# Patient Record
Sex: Male | Born: 1975
Health system: Southern US, Community
[De-identification: ages and names within clinical notes are randomized; demographics above are authoritative.]

## PROBLEM LIST (undated history)

## (undated) DIAGNOSIS — F4010 Social phobia, unspecified: Secondary | ICD-10-CM

## (undated) DIAGNOSIS — Z8719 Personal history of other diseases of the digestive system: Secondary | ICD-10-CM

## (undated) DIAGNOSIS — J189 Pneumonia, unspecified organism: Secondary | ICD-10-CM

## (undated) DIAGNOSIS — G629 Polyneuropathy, unspecified: Secondary | ICD-10-CM

## (undated) DIAGNOSIS — I10 Essential (primary) hypertension: Secondary | ICD-10-CM

## (undated) DIAGNOSIS — B450 Pulmonary cryptococcosis: Secondary | ICD-10-CM

## (undated) DIAGNOSIS — Z8711 Personal history of peptic ulcer disease: Secondary | ICD-10-CM

## (undated) DIAGNOSIS — R112 Nausea with vomiting, unspecified: Secondary | ICD-10-CM

## (undated) DIAGNOSIS — C8290 Follicular lymphoma, unspecified, unspecified site: Secondary | ICD-10-CM

## (undated) DIAGNOSIS — M545 Low back pain, unspecified: Secondary | ICD-10-CM

## (undated) DIAGNOSIS — Z9889 Other specified postprocedural states: Secondary | ICD-10-CM

## (undated) DIAGNOSIS — Z8739 Personal history of other diseases of the musculoskeletal system and connective tissue: Secondary | ICD-10-CM

## (undated) DIAGNOSIS — K219 Gastro-esophageal reflux disease without esophagitis: Secondary | ICD-10-CM

## (undated) DIAGNOSIS — G8929 Other chronic pain: Secondary | ICD-10-CM

## (undated) HISTORY — PX: TONSILLECTOMY: SUR1361

## (undated) HISTORY — PX: KNEE ARTHROSCOPY: SHX127

## (undated) HISTORY — PX: CARPAL TUNNEL RELEASE: SHX101

---

## 1999-07-08 ENCOUNTER — Ambulatory Visit (HOSPITAL_BASED_OUTPATIENT_CLINIC_OR_DEPARTMENT_OTHER): Admission: RE | Admit: 1999-07-08 | Discharge: 1999-07-08 | Payer: Self-pay | Admitting: Orthopaedic Surgery

## 2003-01-28 ENCOUNTER — Encounter: Payer: Self-pay | Admitting: Specialist

## 2003-01-28 ENCOUNTER — Ambulatory Visit (HOSPITAL_COMMUNITY): Admission: RE | Admit: 2003-01-28 | Discharge: 2003-01-28 | Payer: Self-pay | Admitting: Specialist

## 2009-07-05 DIAGNOSIS — C8290 Follicular lymphoma, unspecified, unspecified site: Secondary | ICD-10-CM

## 2009-07-05 HISTORY — PX: AXILLARY LYMPH NODE BIOPSY: SHX5737

## 2009-07-05 HISTORY — PX: PORTA CATH INSERTION: CATH118285

## 2009-07-05 HISTORY — DX: Follicular lymphoma, unspecified, unspecified site: C82.90

## 2009-12-23 ENCOUNTER — Other Ambulatory Visit: Admission: RE | Admit: 2009-12-23 | Discharge: 2009-12-23 | Payer: Self-pay | Admitting: *Deleted

## 2012-06-23 DIAGNOSIS — J209 Acute bronchitis, unspecified: Secondary | ICD-10-CM | POA: Diagnosis not present

## 2012-07-03 DIAGNOSIS — C8298 Follicular lymphoma, unspecified, lymph nodes of multiple sites: Secondary | ICD-10-CM | POA: Diagnosis not present

## 2012-07-03 DIAGNOSIS — D702 Other drug-induced agranulocytosis: Secondary | ICD-10-CM | POA: Diagnosis not present

## 2012-08-31 DIAGNOSIS — R599 Enlarged lymph nodes, unspecified: Secondary | ICD-10-CM | POA: Diagnosis not present

## 2012-08-31 DIAGNOSIS — K7689 Other specified diseases of liver: Secondary | ICD-10-CM | POA: Diagnosis not present

## 2012-08-31 DIAGNOSIS — C8298 Follicular lymphoma, unspecified, lymph nodes of multiple sites: Secondary | ICD-10-CM | POA: Diagnosis not present

## 2012-09-07 DIAGNOSIS — C8298 Follicular lymphoma, unspecified, lymph nodes of multiple sites: Secondary | ICD-10-CM | POA: Diagnosis not present

## 2012-09-20 DIAGNOSIS — R599 Enlarged lymph nodes, unspecified: Secondary | ICD-10-CM | POA: Diagnosis not present

## 2012-09-20 DIAGNOSIS — Z87898 Personal history of other specified conditions: Secondary | ICD-10-CM | POA: Diagnosis not present

## 2012-09-20 DIAGNOSIS — K7689 Other specified diseases of liver: Secondary | ICD-10-CM | POA: Diagnosis not present

## 2012-10-03 DIAGNOSIS — Z79899 Other long term (current) drug therapy: Secondary | ICD-10-CM | POA: Diagnosis not present

## 2012-10-03 DIAGNOSIS — C8299 Follicular lymphoma, unspecified, extranodal and solid organ sites: Secondary | ICD-10-CM | POA: Diagnosis not present

## 2012-10-06 DIAGNOSIS — C8589 Other specified types of non-Hodgkin lymphoma, extranodal and solid organ sites: Secondary | ICD-10-CM | POA: Diagnosis not present

## 2012-10-06 DIAGNOSIS — J301 Allergic rhinitis due to pollen: Secondary | ICD-10-CM | POA: Diagnosis not present

## 2012-10-06 DIAGNOSIS — I10 Essential (primary) hypertension: Secondary | ICD-10-CM | POA: Diagnosis not present

## 2012-10-09 DIAGNOSIS — C8298 Follicular lymphoma, unspecified, lymph nodes of multiple sites: Secondary | ICD-10-CM | POA: Diagnosis not present

## 2012-10-09 DIAGNOSIS — D702 Other drug-induced agranulocytosis: Secondary | ICD-10-CM | POA: Diagnosis not present

## 2012-10-26 DIAGNOSIS — I4891 Unspecified atrial fibrillation: Secondary | ICD-10-CM | POA: Diagnosis not present

## 2012-10-26 DIAGNOSIS — C8299 Follicular lymphoma, unspecified, extranodal and solid organ sites: Secondary | ICD-10-CM | POA: Diagnosis not present

## 2012-10-26 DIAGNOSIS — Z79899 Other long term (current) drug therapy: Secondary | ICD-10-CM | POA: Diagnosis not present

## 2012-10-26 DIAGNOSIS — C8589 Other specified types of non-Hodgkin lymphoma, extranodal and solid organ sites: Secondary | ICD-10-CM | POA: Diagnosis not present

## 2012-10-26 DIAGNOSIS — C8290 Follicular lymphoma, unspecified, unspecified site: Secondary | ICD-10-CM | POA: Insufficient documentation

## 2012-11-10 DIAGNOSIS — C8299 Follicular lymphoma, unspecified, extranodal and solid organ sites: Secondary | ICD-10-CM | POA: Diagnosis not present

## 2012-11-10 DIAGNOSIS — C8589 Other specified types of non-Hodgkin lymphoma, extranodal and solid organ sites: Secondary | ICD-10-CM | POA: Diagnosis not present

## 2012-11-10 DIAGNOSIS — Z79899 Other long term (current) drug therapy: Secondary | ICD-10-CM | POA: Diagnosis not present

## 2012-11-16 DIAGNOSIS — C96A Histiocytic sarcoma: Secondary | ICD-10-CM | POA: Diagnosis not present

## 2012-11-16 DIAGNOSIS — C8299 Follicular lymphoma, unspecified, extranodal and solid organ sites: Secondary | ICD-10-CM | POA: Diagnosis not present

## 2012-11-23 DIAGNOSIS — D702 Other drug-induced agranulocytosis: Secondary | ICD-10-CM | POA: Diagnosis not present

## 2012-11-23 DIAGNOSIS — C8298 Follicular lymphoma, unspecified, lymph nodes of multiple sites: Secondary | ICD-10-CM | POA: Diagnosis not present

## 2012-11-24 DIAGNOSIS — Z5111 Encounter for antineoplastic chemotherapy: Secondary | ICD-10-CM | POA: Diagnosis not present

## 2012-11-24 DIAGNOSIS — C8298 Follicular lymphoma, unspecified, lymph nodes of multiple sites: Secondary | ICD-10-CM | POA: Diagnosis not present

## 2012-11-24 DIAGNOSIS — D702 Other drug-induced agranulocytosis: Secondary | ICD-10-CM | POA: Diagnosis not present

## 2012-11-27 DIAGNOSIS — C8298 Follicular lymphoma, unspecified, lymph nodes of multiple sites: Secondary | ICD-10-CM | POA: Diagnosis not present

## 2012-11-27 DIAGNOSIS — D702 Other drug-induced agranulocytosis: Secondary | ICD-10-CM | POA: Diagnosis not present

## 2012-11-27 DIAGNOSIS — IMO0002 Reserved for concepts with insufficient information to code with codable children: Secondary | ICD-10-CM | POA: Diagnosis not present

## 2012-12-01 DIAGNOSIS — R7401 Elevation of levels of liver transaminase levels: Secondary | ICD-10-CM | POA: Diagnosis present

## 2012-12-01 DIAGNOSIS — D61810 Antineoplastic chemotherapy induced pancytopenia: Secondary | ICD-10-CM | POA: Diagnosis present

## 2012-12-01 DIAGNOSIS — I10 Essential (primary) hypertension: Secondary | ICD-10-CM | POA: Diagnosis not present

## 2012-12-01 DIAGNOSIS — R7402 Elevation of levels of lactic acid dehydrogenase (LDH): Secondary | ICD-10-CM | POA: Diagnosis not present

## 2012-12-01 DIAGNOSIS — T451X5A Adverse effect of antineoplastic and immunosuppressive drugs, initial encounter: Secondary | ICD-10-CM | POA: Diagnosis not present

## 2012-12-01 DIAGNOSIS — C8299 Follicular lymphoma, unspecified, extranodal and solid organ sites: Secondary | ICD-10-CM | POA: Diagnosis not present

## 2012-12-01 DIAGNOSIS — R509 Fever, unspecified: Secondary | ICD-10-CM | POA: Diagnosis not present

## 2012-12-01 DIAGNOSIS — R7989 Other specified abnormal findings of blood chemistry: Secondary | ICD-10-CM | POA: Diagnosis present

## 2012-12-01 DIAGNOSIS — A4150 Gram-negative sepsis, unspecified: Secondary | ICD-10-CM | POA: Diagnosis not present

## 2012-12-01 DIAGNOSIS — M1990 Unspecified osteoarthritis, unspecified site: Secondary | ICD-10-CM | POA: Diagnosis not present

## 2012-12-01 DIAGNOSIS — D696 Thrombocytopenia, unspecified: Secondary | ICD-10-CM | POA: Diagnosis not present

## 2012-12-01 DIAGNOSIS — F411 Generalized anxiety disorder: Secondary | ICD-10-CM | POA: Diagnosis not present

## 2012-12-01 DIAGNOSIS — Z79899 Other long term (current) drug therapy: Secondary | ICD-10-CM | POA: Diagnosis not present

## 2012-12-01 DIAGNOSIS — D709 Neutropenia, unspecified: Secondary | ICD-10-CM | POA: Diagnosis not present

## 2012-12-01 DIAGNOSIS — A419 Sepsis, unspecified organism: Secondary | ICD-10-CM | POA: Diagnosis not present

## 2012-12-02 DIAGNOSIS — D61810 Antineoplastic chemotherapy induced pancytopenia: Secondary | ICD-10-CM | POA: Diagnosis not present

## 2012-12-02 DIAGNOSIS — A4150 Gram-negative sepsis, unspecified: Secondary | ICD-10-CM | POA: Diagnosis not present

## 2012-12-02 DIAGNOSIS — M1990 Unspecified osteoarthritis, unspecified site: Secondary | ICD-10-CM | POA: Diagnosis not present

## 2012-12-02 DIAGNOSIS — C8299 Follicular lymphoma, unspecified, extranodal and solid organ sites: Secondary | ICD-10-CM | POA: Diagnosis not present

## 2012-12-02 DIAGNOSIS — F411 Generalized anxiety disorder: Secondary | ICD-10-CM | POA: Diagnosis not present

## 2012-12-02 DIAGNOSIS — I10 Essential (primary) hypertension: Secondary | ICD-10-CM | POA: Diagnosis not present

## 2012-12-02 DIAGNOSIS — D709 Neutropenia, unspecified: Secondary | ICD-10-CM | POA: Diagnosis not present

## 2012-12-02 DIAGNOSIS — A419 Sepsis, unspecified organism: Secondary | ICD-10-CM | POA: Diagnosis not present

## 2012-12-03 DIAGNOSIS — F411 Generalized anxiety disorder: Secondary | ICD-10-CM | POA: Diagnosis not present

## 2012-12-03 DIAGNOSIS — A4150 Gram-negative sepsis, unspecified: Secondary | ICD-10-CM | POA: Diagnosis not present

## 2012-12-03 DIAGNOSIS — D709 Neutropenia, unspecified: Secondary | ICD-10-CM | POA: Diagnosis not present

## 2012-12-03 DIAGNOSIS — A419 Sepsis, unspecified organism: Secondary | ICD-10-CM | POA: Diagnosis not present

## 2012-12-03 DIAGNOSIS — D61810 Antineoplastic chemotherapy induced pancytopenia: Secondary | ICD-10-CM | POA: Diagnosis not present

## 2012-12-03 DIAGNOSIS — C8299 Follicular lymphoma, unspecified, extranodal and solid organ sites: Secondary | ICD-10-CM | POA: Diagnosis not present

## 2012-12-03 DIAGNOSIS — I10 Essential (primary) hypertension: Secondary | ICD-10-CM | POA: Diagnosis not present

## 2012-12-03 DIAGNOSIS — M1990 Unspecified osteoarthritis, unspecified site: Secondary | ICD-10-CM | POA: Diagnosis not present

## 2012-12-04 DIAGNOSIS — C8299 Follicular lymphoma, unspecified, extranodal and solid organ sites: Secondary | ICD-10-CM | POA: Diagnosis not present

## 2012-12-04 DIAGNOSIS — I10 Essential (primary) hypertension: Secondary | ICD-10-CM | POA: Diagnosis not present

## 2012-12-04 DIAGNOSIS — D709 Neutropenia, unspecified: Secondary | ICD-10-CM | POA: Diagnosis not present

## 2012-12-04 DIAGNOSIS — A419 Sepsis, unspecified organism: Secondary | ICD-10-CM | POA: Diagnosis not present

## 2012-12-15 DIAGNOSIS — D702 Other drug-induced agranulocytosis: Secondary | ICD-10-CM | POA: Diagnosis not present

## 2012-12-15 DIAGNOSIS — C8298 Follicular lymphoma, unspecified, lymph nodes of multiple sites: Secondary | ICD-10-CM | POA: Diagnosis not present

## 2012-12-18 DIAGNOSIS — D702 Other drug-induced agranulocytosis: Secondary | ICD-10-CM | POA: Diagnosis not present

## 2012-12-18 DIAGNOSIS — C8298 Follicular lymphoma, unspecified, lymph nodes of multiple sites: Secondary | ICD-10-CM | POA: Diagnosis not present

## 2013-01-02 DIAGNOSIS — C8298 Follicular lymphoma, unspecified, lymph nodes of multiple sites: Secondary | ICD-10-CM | POA: Diagnosis not present

## 2013-01-04 DIAGNOSIS — D702 Other drug-induced agranulocytosis: Secondary | ICD-10-CM | POA: Diagnosis not present

## 2013-01-04 DIAGNOSIS — C8298 Follicular lymphoma, unspecified, lymph nodes of multiple sites: Secondary | ICD-10-CM | POA: Diagnosis not present

## 2013-01-05 DIAGNOSIS — Z5189 Encounter for other specified aftercare: Secondary | ICD-10-CM | POA: Diagnosis not present

## 2013-01-05 DIAGNOSIS — C8589 Other specified types of non-Hodgkin lymphoma, extranodal and solid organ sites: Secondary | ICD-10-CM | POA: Diagnosis not present

## 2013-01-26 DIAGNOSIS — IMO0002 Reserved for concepts with insufficient information to code with codable children: Secondary | ICD-10-CM | POA: Diagnosis not present

## 2013-01-26 DIAGNOSIS — C8298 Follicular lymphoma, unspecified, lymph nodes of multiple sites: Secondary | ICD-10-CM | POA: Diagnosis not present

## 2013-01-29 DIAGNOSIS — C8589 Other specified types of non-Hodgkin lymphoma, extranodal and solid organ sites: Secondary | ICD-10-CM | POA: Diagnosis not present

## 2013-01-29 DIAGNOSIS — C8298 Follicular lymphoma, unspecified, lymph nodes of multiple sites: Secondary | ICD-10-CM | POA: Diagnosis not present

## 2013-01-29 DIAGNOSIS — D702 Other drug-induced agranulocytosis: Secondary | ICD-10-CM | POA: Diagnosis not present

## 2013-02-19 DIAGNOSIS — C8298 Follicular lymphoma, unspecified, lymph nodes of multiple sites: Secondary | ICD-10-CM | POA: Diagnosis not present

## 2013-02-19 DIAGNOSIS — D702 Other drug-induced agranulocytosis: Secondary | ICD-10-CM | POA: Diagnosis not present

## 2013-02-22 DIAGNOSIS — C8589 Other specified types of non-Hodgkin lymphoma, extranodal and solid organ sites: Secondary | ICD-10-CM | POA: Diagnosis not present

## 2013-02-22 DIAGNOSIS — R0602 Shortness of breath: Secondary | ICD-10-CM | POA: Diagnosis not present

## 2013-02-22 DIAGNOSIS — Z01818 Encounter for other preprocedural examination: Secondary | ICD-10-CM | POA: Diagnosis not present

## 2013-02-22 DIAGNOSIS — I4891 Unspecified atrial fibrillation: Secondary | ICD-10-CM | POA: Diagnosis not present

## 2013-02-22 DIAGNOSIS — Z79899 Other long term (current) drug therapy: Secondary | ICD-10-CM | POA: Diagnosis not present

## 2013-02-27 DIAGNOSIS — C8298 Follicular lymphoma, unspecified, lymph nodes of multiple sites: Secondary | ICD-10-CM | POA: Diagnosis not present

## 2013-02-28 DIAGNOSIS — C8589 Other specified types of non-Hodgkin lymphoma, extranodal and solid organ sites: Secondary | ICD-10-CM | POA: Diagnosis not present

## 2013-02-28 DIAGNOSIS — M799 Soft tissue disorder, unspecified: Secondary | ICD-10-CM | POA: Diagnosis not present

## 2013-02-28 DIAGNOSIS — C8299 Follicular lymphoma, unspecified, extranodal and solid organ sites: Secondary | ICD-10-CM | POA: Diagnosis not present

## 2013-03-01 DIAGNOSIS — J209 Acute bronchitis, unspecified: Secondary | ICD-10-CM | POA: Diagnosis not present

## 2013-03-07 DIAGNOSIS — C8299 Follicular lymphoma, unspecified, extranodal and solid organ sites: Secondary | ICD-10-CM | POA: Diagnosis not present

## 2013-03-07 DIAGNOSIS — Z9481 Bone marrow transplant status: Secondary | ICD-10-CM | POA: Diagnosis not present

## 2013-03-15 DIAGNOSIS — C8299 Follicular lymphoma, unspecified, extranodal and solid organ sites: Secondary | ICD-10-CM | POA: Diagnosis not present

## 2013-03-19 DIAGNOSIS — D702 Other drug-induced agranulocytosis: Secondary | ICD-10-CM | POA: Diagnosis not present

## 2013-03-19 DIAGNOSIS — Z5111 Encounter for antineoplastic chemotherapy: Secondary | ICD-10-CM | POA: Diagnosis not present

## 2013-03-19 DIAGNOSIS — C8298 Follicular lymphoma, unspecified, lymph nodes of multiple sites: Secondary | ICD-10-CM | POA: Diagnosis not present

## 2013-03-20 DIAGNOSIS — D702 Other drug-induced agranulocytosis: Secondary | ICD-10-CM | POA: Diagnosis not present

## 2013-03-20 DIAGNOSIS — C8298 Follicular lymphoma, unspecified, lymph nodes of multiple sites: Secondary | ICD-10-CM | POA: Diagnosis not present

## 2013-04-02 DIAGNOSIS — D702 Other drug-induced agranulocytosis: Secondary | ICD-10-CM | POA: Diagnosis not present

## 2013-04-02 DIAGNOSIS — C8298 Follicular lymphoma, unspecified, lymph nodes of multiple sites: Secondary | ICD-10-CM | POA: Diagnosis not present

## 2013-04-09 DIAGNOSIS — D702 Other drug-induced agranulocytosis: Secondary | ICD-10-CM | POA: Diagnosis not present

## 2013-04-09 DIAGNOSIS — C8298 Follicular lymphoma, unspecified, lymph nodes of multiple sites: Secondary | ICD-10-CM | POA: Diagnosis not present

## 2013-04-09 DIAGNOSIS — Z5111 Encounter for antineoplastic chemotherapy: Secondary | ICD-10-CM | POA: Diagnosis not present

## 2013-04-10 DIAGNOSIS — C8298 Follicular lymphoma, unspecified, lymph nodes of multiple sites: Secondary | ICD-10-CM | POA: Diagnosis not present

## 2013-04-10 DIAGNOSIS — D702 Other drug-induced agranulocytosis: Secondary | ICD-10-CM | POA: Diagnosis not present

## 2013-05-22 DIAGNOSIS — C8298 Follicular lymphoma, unspecified, lymph nodes of multiple sites: Secondary | ICD-10-CM | POA: Diagnosis not present

## 2013-05-22 DIAGNOSIS — C8589 Other specified types of non-Hodgkin lymphoma, extranodal and solid organ sites: Secondary | ICD-10-CM | POA: Diagnosis not present

## 2013-05-22 DIAGNOSIS — D702 Other drug-induced agranulocytosis: Secondary | ICD-10-CM | POA: Diagnosis not present

## 2013-05-24 DIAGNOSIS — Z23 Encounter for immunization: Secondary | ICD-10-CM | POA: Diagnosis not present

## 2013-05-24 DIAGNOSIS — C8298 Follicular lymphoma, unspecified, lymph nodes of multiple sites: Secondary | ICD-10-CM | POA: Diagnosis not present

## 2013-05-24 DIAGNOSIS — Z09 Encounter for follow-up examination after completed treatment for conditions other than malignant neoplasm: Secondary | ICD-10-CM | POA: Diagnosis not present

## 2013-07-31 DIAGNOSIS — C8298 Follicular lymphoma, unspecified, lymph nodes of multiple sites: Secondary | ICD-10-CM | POA: Diagnosis not present

## 2013-07-31 DIAGNOSIS — C8589 Other specified types of non-Hodgkin lymphoma, extranodal and solid organ sites: Secondary | ICD-10-CM | POA: Diagnosis not present

## 2013-08-02 DIAGNOSIS — C8293 Follicular lymphoma, unspecified, intra-abdominal lymph nodes: Secondary | ICD-10-CM | POA: Diagnosis not present

## 2013-08-02 DIAGNOSIS — Z09 Encounter for follow-up examination after completed treatment for conditions other than malignant neoplasm: Secondary | ICD-10-CM | POA: Diagnosis not present

## 2013-09-06 DIAGNOSIS — C8299 Follicular lymphoma, unspecified, extranodal and solid organ sites: Secondary | ICD-10-CM | POA: Diagnosis not present

## 2013-09-11 DIAGNOSIS — D702 Other drug-induced agranulocytosis: Secondary | ICD-10-CM | POA: Diagnosis not present

## 2013-09-11 DIAGNOSIS — Z09 Encounter for follow-up examination after completed treatment for conditions other than malignant neoplasm: Secondary | ICD-10-CM | POA: Diagnosis not present

## 2013-09-11 DIAGNOSIS — C8298 Follicular lymphoma, unspecified, lymph nodes of multiple sites: Secondary | ICD-10-CM | POA: Diagnosis not present

## 2013-12-14 DIAGNOSIS — C8298 Follicular lymphoma, unspecified, lymph nodes of multiple sites: Secondary | ICD-10-CM | POA: Diagnosis not present

## 2013-12-14 DIAGNOSIS — Z09 Encounter for follow-up examination after completed treatment for conditions other than malignant neoplasm: Secondary | ICD-10-CM | POA: Diagnosis not present

## 2013-12-21 DIAGNOSIS — R5381 Other malaise: Secondary | ICD-10-CM | POA: Diagnosis not present

## 2013-12-21 DIAGNOSIS — R110 Nausea: Secondary | ICD-10-CM | POA: Diagnosis not present

## 2013-12-21 DIAGNOSIS — R5383 Other fatigue: Secondary | ICD-10-CM | POA: Diagnosis not present

## 2013-12-21 DIAGNOSIS — E860 Dehydration: Secondary | ICD-10-CM | POA: Diagnosis not present

## 2013-12-21 DIAGNOSIS — J9600 Acute respiratory failure, unspecified whether with hypoxia or hypercapnia: Secondary | ICD-10-CM | POA: Diagnosis not present

## 2013-12-21 DIAGNOSIS — R5081 Fever presenting with conditions classified elsewhere: Secondary | ICD-10-CM | POA: Diagnosis not present

## 2013-12-21 DIAGNOSIS — J3489 Other specified disorders of nose and nasal sinuses: Secondary | ICD-10-CM | POA: Diagnosis not present

## 2013-12-21 DIAGNOSIS — C8589 Other specified types of non-Hodgkin lymphoma, extranodal and solid organ sites: Secondary | ICD-10-CM | POA: Diagnosis not present

## 2013-12-21 DIAGNOSIS — R109 Unspecified abdominal pain: Secondary | ICD-10-CM | POA: Diagnosis not present

## 2013-12-21 DIAGNOSIS — E876 Hypokalemia: Secondary | ICD-10-CM | POA: Diagnosis not present

## 2013-12-21 DIAGNOSIS — R05 Cough: Secondary | ICD-10-CM | POA: Diagnosis not present

## 2013-12-21 DIAGNOSIS — I10 Essential (primary) hypertension: Secondary | ICD-10-CM | POA: Diagnosis not present

## 2013-12-21 DIAGNOSIS — Z79899 Other long term (current) drug therapy: Secondary | ICD-10-CM | POA: Diagnosis not present

## 2013-12-21 DIAGNOSIS — K5900 Constipation, unspecified: Secondary | ICD-10-CM | POA: Diagnosis not present

## 2013-12-21 DIAGNOSIS — D709 Neutropenia, unspecified: Secondary | ICD-10-CM | POA: Diagnosis not present

## 2013-12-21 DIAGNOSIS — R509 Fever, unspecified: Secondary | ICD-10-CM | POA: Diagnosis not present

## 2013-12-21 DIAGNOSIS — K219 Gastro-esophageal reflux disease without esophagitis: Secondary | ICD-10-CM | POA: Diagnosis not present

## 2013-12-21 DIAGNOSIS — R52 Pain, unspecified: Secondary | ICD-10-CM | POA: Diagnosis not present

## 2013-12-21 DIAGNOSIS — R059 Cough, unspecified: Secondary | ICD-10-CM | POA: Diagnosis not present

## 2013-12-21 DIAGNOSIS — C8299 Follicular lymphoma, unspecified, extranodal and solid organ sites: Secondary | ICD-10-CM | POA: Diagnosis not present

## 2013-12-21 DIAGNOSIS — F411 Generalized anxiety disorder: Secondary | ICD-10-CM | POA: Diagnosis not present

## 2013-12-27 DIAGNOSIS — D702 Other drug-induced agranulocytosis: Secondary | ICD-10-CM | POA: Diagnosis not present

## 2013-12-27 DIAGNOSIS — Z09 Encounter for follow-up examination after completed treatment for conditions other than malignant neoplasm: Secondary | ICD-10-CM | POA: Diagnosis not present

## 2013-12-27 DIAGNOSIS — C8298 Follicular lymphoma, unspecified, lymph nodes of multiple sites: Secondary | ICD-10-CM | POA: Diagnosis not present

## 2013-12-28 DIAGNOSIS — D702 Other drug-induced agranulocytosis: Secondary | ICD-10-CM | POA: Diagnosis not present

## 2013-12-28 DIAGNOSIS — C8298 Follicular lymphoma, unspecified, lymph nodes of multiple sites: Secondary | ICD-10-CM | POA: Diagnosis not present

## 2013-12-31 DIAGNOSIS — C8298 Follicular lymphoma, unspecified, lymph nodes of multiple sites: Secondary | ICD-10-CM | POA: Diagnosis not present

## 2013-12-31 DIAGNOSIS — D702 Other drug-induced agranulocytosis: Secondary | ICD-10-CM | POA: Diagnosis not present

## 2013-12-31 DIAGNOSIS — Z09 Encounter for follow-up examination after completed treatment for conditions other than malignant neoplasm: Secondary | ICD-10-CM | POA: Diagnosis not present

## 2014-01-15 DIAGNOSIS — C8589 Other specified types of non-Hodgkin lymphoma, extranodal and solid organ sites: Secondary | ICD-10-CM | POA: Diagnosis not present

## 2014-01-15 DIAGNOSIS — C8298 Follicular lymphoma, unspecified, lymph nodes of multiple sites: Secondary | ICD-10-CM | POA: Diagnosis not present

## 2014-01-16 DIAGNOSIS — C8298 Follicular lymphoma, unspecified, lymph nodes of multiple sites: Secondary | ICD-10-CM | POA: Diagnosis not present

## 2014-01-16 DIAGNOSIS — D702 Other drug-induced agranulocytosis: Secondary | ICD-10-CM | POA: Diagnosis not present

## 2014-01-16 DIAGNOSIS — Z09 Encounter for follow-up examination after completed treatment for conditions other than malignant neoplasm: Secondary | ICD-10-CM | POA: Diagnosis not present

## 2014-03-04 DIAGNOSIS — C8299 Follicular lymphoma, unspecified, extranodal and solid organ sites: Secondary | ICD-10-CM | POA: Diagnosis not present

## 2014-04-05 DIAGNOSIS — Z5111 Encounter for antineoplastic chemotherapy: Secondary | ICD-10-CM | POA: Diagnosis not present

## 2014-04-05 DIAGNOSIS — C8218 Follicular lymphoma grade II, lymph nodes of multiple sites: Secondary | ICD-10-CM | POA: Diagnosis not present

## 2014-05-03 DIAGNOSIS — L853 Xerosis cutis: Secondary | ICD-10-CM | POA: Diagnosis not present

## 2014-05-03 DIAGNOSIS — C8218 Follicular lymphoma grade II, lymph nodes of multiple sites: Secondary | ICD-10-CM | POA: Diagnosis not present

## 2014-05-03 DIAGNOSIS — Z23 Encounter for immunization: Secondary | ICD-10-CM | POA: Diagnosis not present

## 2014-05-20 DIAGNOSIS — S93401A Sprain of unspecified ligament of right ankle, initial encounter: Secondary | ICD-10-CM | POA: Diagnosis not present

## 2014-05-20 DIAGNOSIS — W1840XA Slipping, tripping and stumbling without falling, unspecified, initial encounter: Secondary | ICD-10-CM | POA: Diagnosis not present

## 2014-05-20 DIAGNOSIS — I10 Essential (primary) hypertension: Secondary | ICD-10-CM | POA: Diagnosis not present

## 2014-05-31 DIAGNOSIS — D709 Neutropenia, unspecified: Secondary | ICD-10-CM | POA: Diagnosis not present

## 2014-05-31 DIAGNOSIS — C8218 Follicular lymphoma grade II, lymph nodes of multiple sites: Secondary | ICD-10-CM | POA: Diagnosis not present

## 2014-06-27 DIAGNOSIS — C8218 Follicular lymphoma grade II, lymph nodes of multiple sites: Secondary | ICD-10-CM | POA: Diagnosis not present

## 2014-07-01 DIAGNOSIS — Z5111 Encounter for antineoplastic chemotherapy: Secondary | ICD-10-CM | POA: Diagnosis not present

## 2014-07-01 DIAGNOSIS — C8218 Follicular lymphoma grade II, lymph nodes of multiple sites: Secondary | ICD-10-CM | POA: Diagnosis not present

## 2014-07-08 DIAGNOSIS — J0100 Acute maxillary sinusitis, unspecified: Secondary | ICD-10-CM | POA: Diagnosis not present

## 2014-07-25 DIAGNOSIS — C8218 Follicular lymphoma grade II, lymph nodes of multiple sites: Secondary | ICD-10-CM | POA: Diagnosis not present

## 2014-07-25 DIAGNOSIS — C8293 Follicular lymphoma, unspecified, intra-abdominal lymph nodes: Secondary | ICD-10-CM | POA: Diagnosis not present

## 2014-07-29 DIAGNOSIS — C8218 Follicular lymphoma grade II, lymph nodes of multiple sites: Secondary | ICD-10-CM | POA: Diagnosis not present

## 2014-07-29 DIAGNOSIS — R5383 Other fatigue: Secondary | ICD-10-CM | POA: Diagnosis not present

## 2014-08-30 DIAGNOSIS — C8218 Follicular lymphoma grade II, lymph nodes of multiple sites: Secondary | ICD-10-CM | POA: Diagnosis not present

## 2014-09-04 DIAGNOSIS — C8218 Follicular lymphoma grade II, lymph nodes of multiple sites: Secondary | ICD-10-CM | POA: Diagnosis not present

## 2014-09-04 DIAGNOSIS — Z5111 Encounter for antineoplastic chemotherapy: Secondary | ICD-10-CM | POA: Diagnosis not present

## 2014-09-05 DIAGNOSIS — C8218 Follicular lymphoma grade II, lymph nodes of multiple sites: Secondary | ICD-10-CM | POA: Diagnosis not present

## 2014-09-17 DIAGNOSIS — C8218 Follicular lymphoma grade II, lymph nodes of multiple sites: Secondary | ICD-10-CM | POA: Diagnosis not present

## 2014-10-04 DIAGNOSIS — C8218 Follicular lymphoma grade II, lymph nodes of multiple sites: Secondary | ICD-10-CM | POA: Diagnosis not present

## 2014-10-07 DIAGNOSIS — C8218 Follicular lymphoma grade II, lymph nodes of multiple sites: Secondary | ICD-10-CM | POA: Diagnosis not present

## 2014-10-11 DIAGNOSIS — C8218 Follicular lymphoma grade II, lymph nodes of multiple sites: Secondary | ICD-10-CM | POA: Diagnosis not present

## 2014-10-11 DIAGNOSIS — Z5111 Encounter for antineoplastic chemotherapy: Secondary | ICD-10-CM | POA: Diagnosis not present

## 2014-11-05 DIAGNOSIS — I10 Essential (primary) hypertension: Secondary | ICD-10-CM | POA: Diagnosis not present

## 2014-11-05 DIAGNOSIS — C8250 Diffuse follicle center lymphoma, unspecified site: Secondary | ICD-10-CM | POA: Diagnosis not present

## 2014-11-08 DIAGNOSIS — C8218 Follicular lymphoma grade II, lymph nodes of multiple sites: Secondary | ICD-10-CM | POA: Diagnosis not present

## 2014-11-08 DIAGNOSIS — D709 Neutropenia, unspecified: Secondary | ICD-10-CM | POA: Diagnosis not present

## 2014-11-28 DIAGNOSIS — J0100 Acute maxillary sinusitis, unspecified: Secondary | ICD-10-CM | POA: Diagnosis not present

## 2014-11-28 DIAGNOSIS — I10 Essential (primary) hypertension: Secondary | ICD-10-CM | POA: Diagnosis not present

## 2014-12-04 DIAGNOSIS — C8218 Follicular lymphoma grade II, lymph nodes of multiple sites: Secondary | ICD-10-CM | POA: Diagnosis not present

## 2014-12-04 DIAGNOSIS — C8293 Follicular lymphoma, unspecified, intra-abdominal lymph nodes: Secondary | ICD-10-CM | POA: Diagnosis not present

## 2014-12-05 DIAGNOSIS — C8218 Follicular lymphoma grade II, lymph nodes of multiple sites: Secondary | ICD-10-CM | POA: Diagnosis not present

## 2014-12-13 DIAGNOSIS — C8218 Follicular lymphoma grade II, lymph nodes of multiple sites: Secondary | ICD-10-CM | POA: Diagnosis not present

## 2014-12-13 DIAGNOSIS — D709 Neutropenia, unspecified: Secondary | ICD-10-CM | POA: Diagnosis not present

## 2014-12-20 DIAGNOSIS — Z5111 Encounter for antineoplastic chemotherapy: Secondary | ICD-10-CM | POA: Diagnosis not present

## 2014-12-20 DIAGNOSIS — C8218 Follicular lymphoma grade II, lymph nodes of multiple sites: Secondary | ICD-10-CM | POA: Diagnosis not present

## 2014-12-23 DIAGNOSIS — C8218 Follicular lymphoma grade II, lymph nodes of multiple sites: Secondary | ICD-10-CM | POA: Diagnosis not present

## 2015-01-02 DIAGNOSIS — J0100 Acute maxillary sinusitis, unspecified: Secondary | ICD-10-CM | POA: Diagnosis not present

## 2015-01-02 DIAGNOSIS — I10 Essential (primary) hypertension: Secondary | ICD-10-CM | POA: Diagnosis not present

## 2015-01-17 DIAGNOSIS — C8218 Follicular lymphoma grade II, lymph nodes of multiple sites: Secondary | ICD-10-CM | POA: Diagnosis not present

## 2015-01-20 DIAGNOSIS — C8218 Follicular lymphoma grade II, lymph nodes of multiple sites: Secondary | ICD-10-CM | POA: Diagnosis not present

## 2015-02-14 DIAGNOSIS — C8218 Follicular lymphoma grade II, lymph nodes of multiple sites: Secondary | ICD-10-CM | POA: Diagnosis not present

## 2015-02-14 DIAGNOSIS — L600 Ingrowing nail: Secondary | ICD-10-CM | POA: Diagnosis not present

## 2015-02-14 DIAGNOSIS — T24232A Burn of second degree of left lower leg, initial encounter: Secondary | ICD-10-CM | POA: Diagnosis not present

## 2015-02-14 DIAGNOSIS — C8299 Follicular lymphoma, unspecified, extranodal and solid organ sites: Secondary | ICD-10-CM | POA: Diagnosis not present

## 2015-02-17 DIAGNOSIS — D709 Neutropenia, unspecified: Secondary | ICD-10-CM | POA: Diagnosis not present

## 2015-02-17 DIAGNOSIS — C8218 Follicular lymphoma grade II, lymph nodes of multiple sites: Secondary | ICD-10-CM | POA: Diagnosis not present

## 2015-02-17 DIAGNOSIS — Z0001 Encounter for general adult medical examination with abnormal findings: Secondary | ICD-10-CM | POA: Diagnosis not present

## 2015-02-18 DIAGNOSIS — L600 Ingrowing nail: Secondary | ICD-10-CM | POA: Diagnosis not present

## 2015-02-18 DIAGNOSIS — T24232A Burn of second degree of left lower leg, initial encounter: Secondary | ICD-10-CM | POA: Diagnosis not present

## 2015-02-19 DIAGNOSIS — Z5111 Encounter for antineoplastic chemotherapy: Secondary | ICD-10-CM | POA: Diagnosis not present

## 2015-02-19 DIAGNOSIS — C8218 Follicular lymphoma grade II, lymph nodes of multiple sites: Secondary | ICD-10-CM | POA: Diagnosis not present

## 2015-03-14 DIAGNOSIS — C8218 Follicular lymphoma grade II, lymph nodes of multiple sites: Secondary | ICD-10-CM | POA: Diagnosis not present

## 2015-03-14 DIAGNOSIS — R51 Headache: Secondary | ICD-10-CM | POA: Diagnosis not present

## 2015-03-17 DIAGNOSIS — C8218 Follicular lymphoma grade II, lymph nodes of multiple sites: Secondary | ICD-10-CM | POA: Diagnosis not present

## 2015-04-10 DIAGNOSIS — Z8572 Personal history of non-Hodgkin lymphomas: Secondary | ICD-10-CM | POA: Diagnosis not present

## 2015-04-10 DIAGNOSIS — C8218 Follicular lymphoma grade II, lymph nodes of multiple sites: Secondary | ICD-10-CM | POA: Diagnosis not present

## 2015-04-11 DIAGNOSIS — C8290 Follicular lymphoma, unspecified, unspecified site: Secondary | ICD-10-CM | POA: Diagnosis not present

## 2015-04-11 DIAGNOSIS — C8218 Follicular lymphoma grade II, lymph nodes of multiple sites: Secondary | ICD-10-CM | POA: Diagnosis not present

## 2015-05-09 DIAGNOSIS — C8218 Follicular lymphoma grade II, lymph nodes of multiple sites: Secondary | ICD-10-CM | POA: Diagnosis not present

## 2015-05-12 DIAGNOSIS — D701 Agranulocytosis secondary to cancer chemotherapy: Secondary | ICD-10-CM | POA: Diagnosis not present

## 2015-05-12 DIAGNOSIS — C8218 Follicular lymphoma grade II, lymph nodes of multiple sites: Secondary | ICD-10-CM | POA: Diagnosis not present

## 2015-05-28 DIAGNOSIS — D72819 Decreased white blood cell count, unspecified: Secondary | ICD-10-CM | POA: Diagnosis not present

## 2015-05-28 DIAGNOSIS — Z0001 Encounter for general adult medical examination with abnormal findings: Secondary | ICD-10-CM | POA: Diagnosis not present

## 2015-05-28 DIAGNOSIS — E663 Overweight: Secondary | ICD-10-CM | POA: Diagnosis not present

## 2015-05-28 DIAGNOSIS — Z0000 Encounter for general adult medical examination without abnormal findings: Secondary | ICD-10-CM | POA: Diagnosis not present

## 2015-05-28 DIAGNOSIS — R5383 Other fatigue: Secondary | ICD-10-CM | POA: Diagnosis not present

## 2015-05-28 DIAGNOSIS — C8299 Follicular lymphoma, unspecified, extranodal and solid organ sites: Secondary | ICD-10-CM | POA: Diagnosis not present

## 2015-05-28 DIAGNOSIS — L600 Ingrowing nail: Secondary | ICD-10-CM | POA: Diagnosis not present

## 2015-05-28 DIAGNOSIS — Z6834 Body mass index (BMI) 34.0-34.9, adult: Secondary | ICD-10-CM | POA: Diagnosis not present

## 2015-05-28 DIAGNOSIS — J301 Allergic rhinitis due to pollen: Secondary | ICD-10-CM | POA: Diagnosis not present

## 2015-05-28 DIAGNOSIS — Z79899 Other long term (current) drug therapy: Secondary | ICD-10-CM | POA: Diagnosis not present

## 2015-05-28 DIAGNOSIS — Z23 Encounter for immunization: Secondary | ICD-10-CM | POA: Diagnosis not present

## 2015-06-02 DIAGNOSIS — L600 Ingrowing nail: Secondary | ICD-10-CM | POA: Diagnosis not present

## 2015-06-06 DIAGNOSIS — G620 Drug-induced polyneuropathy: Secondary | ICD-10-CM | POA: Diagnosis not present

## 2015-06-06 DIAGNOSIS — C8218 Follicular lymphoma grade II, lymph nodes of multiple sites: Secondary | ICD-10-CM | POA: Diagnosis not present

## 2015-06-09 DIAGNOSIS — Z7689 Persons encountering health services in other specified circumstances: Secondary | ICD-10-CM | POA: Diagnosis not present

## 2015-06-09 DIAGNOSIS — C8218 Follicular lymphoma grade II, lymph nodes of multiple sites: Secondary | ICD-10-CM | POA: Diagnosis not present

## 2015-07-04 DIAGNOSIS — C8218 Follicular lymphoma grade II, lymph nodes of multiple sites: Secondary | ICD-10-CM | POA: Diagnosis not present

## 2015-07-31 DIAGNOSIS — K760 Fatty (change of) liver, not elsewhere classified: Secondary | ICD-10-CM | POA: Diagnosis not present

## 2015-07-31 DIAGNOSIS — N200 Calculus of kidney: Secondary | ICD-10-CM | POA: Diagnosis not present

## 2015-07-31 DIAGNOSIS — C8218 Follicular lymphoma grade II, lymph nodes of multiple sites: Secondary | ICD-10-CM | POA: Diagnosis not present

## 2015-08-01 DIAGNOSIS — C8290 Follicular lymphoma, unspecified, unspecified site: Secondary | ICD-10-CM | POA: Diagnosis not present

## 2015-08-01 DIAGNOSIS — C8218 Follicular lymphoma grade II, lymph nodes of multiple sites: Secondary | ICD-10-CM | POA: Diagnosis not present

## 2015-08-22 DIAGNOSIS — L600 Ingrowing nail: Secondary | ICD-10-CM | POA: Diagnosis not present

## 2015-08-29 DIAGNOSIS — D701 Agranulocytosis secondary to cancer chemotherapy: Secondary | ICD-10-CM | POA: Diagnosis not present

## 2015-08-29 DIAGNOSIS — Z0001 Encounter for general adult medical examination with abnormal findings: Secondary | ICD-10-CM | POA: Diagnosis not present

## 2015-08-29 DIAGNOSIS — D649 Anemia, unspecified: Secondary | ICD-10-CM | POA: Diagnosis not present

## 2015-08-29 DIAGNOSIS — C8218 Follicular lymphoma grade II, lymph nodes of multiple sites: Secondary | ICD-10-CM | POA: Diagnosis not present

## 2015-08-29 DIAGNOSIS — D709 Neutropenia, unspecified: Secondary | ICD-10-CM | POA: Diagnosis not present

## 2015-09-01 DIAGNOSIS — L600 Ingrowing nail: Secondary | ICD-10-CM | POA: Diagnosis not present

## 2015-09-05 DIAGNOSIS — C8218 Follicular lymphoma grade II, lymph nodes of multiple sites: Secondary | ICD-10-CM | POA: Diagnosis not present

## 2015-09-08 DIAGNOSIS — C8218 Follicular lymphoma grade II, lymph nodes of multiple sites: Secondary | ICD-10-CM | POA: Diagnosis not present

## 2015-09-12 DIAGNOSIS — C8218 Follicular lymphoma grade II, lymph nodes of multiple sites: Secondary | ICD-10-CM | POA: Diagnosis not present

## 2015-09-12 DIAGNOSIS — Z5111 Encounter for antineoplastic chemotherapy: Secondary | ICD-10-CM | POA: Diagnosis not present

## 2015-09-15 DIAGNOSIS — L600 Ingrowing nail: Secondary | ICD-10-CM | POA: Diagnosis not present

## 2015-09-15 DIAGNOSIS — C8218 Follicular lymphoma grade II, lymph nodes of multiple sites: Secondary | ICD-10-CM | POA: Diagnosis not present

## 2015-10-10 DIAGNOSIS — C8218 Follicular lymphoma grade II, lymph nodes of multiple sites: Secondary | ICD-10-CM | POA: Diagnosis not present

## 2015-10-10 DIAGNOSIS — C8290 Follicular lymphoma, unspecified, unspecified site: Secondary | ICD-10-CM | POA: Diagnosis not present

## 2015-10-14 DIAGNOSIS — C8218 Follicular lymphoma grade II, lymph nodes of multiple sites: Secondary | ICD-10-CM | POA: Diagnosis not present

## 2015-11-07 DIAGNOSIS — C8218 Follicular lymphoma grade II, lymph nodes of multiple sites: Secondary | ICD-10-CM | POA: Diagnosis not present

## 2015-12-05 DIAGNOSIS — C8218 Follicular lymphoma grade II, lymph nodes of multiple sites: Secondary | ICD-10-CM | POA: Diagnosis not present

## 2015-12-08 DIAGNOSIS — C8218 Follicular lymphoma grade II, lymph nodes of multiple sites: Secondary | ICD-10-CM | POA: Diagnosis not present

## 2015-12-09 DIAGNOSIS — C8218 Follicular lymphoma grade II, lymph nodes of multiple sites: Secondary | ICD-10-CM | POA: Diagnosis not present

## 2015-12-12 DIAGNOSIS — C8218 Follicular lymphoma grade II, lymph nodes of multiple sites: Secondary | ICD-10-CM | POA: Diagnosis not present

## 2015-12-12 DIAGNOSIS — Z5111 Encounter for antineoplastic chemotherapy: Secondary | ICD-10-CM | POA: Diagnosis not present

## 2016-01-09 DIAGNOSIS — C8218 Follicular lymphoma grade II, lymph nodes of multiple sites: Secondary | ICD-10-CM | POA: Diagnosis not present

## 2016-01-12 DIAGNOSIS — Z7689 Persons encountering health services in other specified circumstances: Secondary | ICD-10-CM | POA: Diagnosis not present

## 2016-01-12 DIAGNOSIS — C8218 Follicular lymphoma grade II, lymph nodes of multiple sites: Secondary | ICD-10-CM | POA: Diagnosis not present

## 2016-02-05 DIAGNOSIS — K760 Fatty (change of) liver, not elsewhere classified: Secondary | ICD-10-CM | POA: Diagnosis not present

## 2016-02-05 DIAGNOSIS — C8218 Follicular lymphoma grade II, lymph nodes of multiple sites: Secondary | ICD-10-CM | POA: Diagnosis not present

## 2016-02-06 DIAGNOSIS — C8218 Follicular lymphoma grade II, lymph nodes of multiple sites: Secondary | ICD-10-CM | POA: Diagnosis not present

## 2016-02-09 DIAGNOSIS — Z7689 Persons encountering health services in other specified circumstances: Secondary | ICD-10-CM | POA: Diagnosis not present

## 2016-02-09 DIAGNOSIS — C8218 Follicular lymphoma grade II, lymph nodes of multiple sites: Secondary | ICD-10-CM | POA: Diagnosis not present

## 2016-03-05 DIAGNOSIS — Z0001 Encounter for general adult medical examination with abnormal findings: Secondary | ICD-10-CM | POA: Diagnosis not present

## 2016-03-05 DIAGNOSIS — D649 Anemia, unspecified: Secondary | ICD-10-CM | POA: Diagnosis not present

## 2016-03-05 DIAGNOSIS — C8218 Follicular lymphoma grade II, lymph nodes of multiple sites: Secondary | ICD-10-CM | POA: Diagnosis not present

## 2016-04-02 DIAGNOSIS — C8218 Follicular lymphoma grade II, lymph nodes of multiple sites: Secondary | ICD-10-CM | POA: Diagnosis not present

## 2016-04-05 DIAGNOSIS — Z5189 Encounter for other specified aftercare: Secondary | ICD-10-CM | POA: Diagnosis not present

## 2016-04-05 DIAGNOSIS — C8218 Follicular lymphoma grade II, lymph nodes of multiple sites: Secondary | ICD-10-CM | POA: Diagnosis not present

## 2016-04-06 DIAGNOSIS — C8218 Follicular lymphoma grade II, lymph nodes of multiple sites: Secondary | ICD-10-CM | POA: Diagnosis not present

## 2016-04-06 DIAGNOSIS — Z5189 Encounter for other specified aftercare: Secondary | ICD-10-CM | POA: Diagnosis not present

## 2016-04-07 DIAGNOSIS — C8218 Follicular lymphoma grade II, lymph nodes of multiple sites: Secondary | ICD-10-CM | POA: Diagnosis not present

## 2016-04-30 DIAGNOSIS — Z5111 Encounter for antineoplastic chemotherapy: Secondary | ICD-10-CM | POA: Diagnosis not present

## 2016-04-30 DIAGNOSIS — C8218 Follicular lymphoma grade II, lymph nodes of multiple sites: Secondary | ICD-10-CM | POA: Diagnosis not present

## 2016-05-07 DIAGNOSIS — Z5111 Encounter for antineoplastic chemotherapy: Secondary | ICD-10-CM | POA: Diagnosis not present

## 2016-05-07 DIAGNOSIS — C8218 Follicular lymphoma grade II, lymph nodes of multiple sites: Secondary | ICD-10-CM | POA: Diagnosis not present

## 2016-05-10 DIAGNOSIS — Z5189 Encounter for other specified aftercare: Secondary | ICD-10-CM | POA: Diagnosis not present

## 2016-05-10 DIAGNOSIS — C8218 Follicular lymphoma grade II, lymph nodes of multiple sites: Secondary | ICD-10-CM | POA: Diagnosis not present

## 2016-05-11 DIAGNOSIS — Z5189 Encounter for other specified aftercare: Secondary | ICD-10-CM | POA: Diagnosis not present

## 2016-05-11 DIAGNOSIS — C8218 Follicular lymphoma grade II, lymph nodes of multiple sites: Secondary | ICD-10-CM | POA: Diagnosis not present

## 2016-06-03 DIAGNOSIS — K760 Fatty (change of) liver, not elsewhere classified: Secondary | ICD-10-CM | POA: Diagnosis not present

## 2016-06-03 DIAGNOSIS — C8218 Follicular lymphoma grade II, lymph nodes of multiple sites: Secondary | ICD-10-CM | POA: Diagnosis not present

## 2016-06-03 DIAGNOSIS — R918 Other nonspecific abnormal finding of lung field: Secondary | ICD-10-CM | POA: Diagnosis not present

## 2016-06-04 DIAGNOSIS — D702 Other drug-induced agranulocytosis: Secondary | ICD-10-CM | POA: Diagnosis not present

## 2016-06-04 DIAGNOSIS — C8218 Follicular lymphoma grade II, lymph nodes of multiple sites: Secondary | ICD-10-CM | POA: Diagnosis not present

## 2016-06-07 DIAGNOSIS — C8218 Follicular lymphoma grade II, lymph nodes of multiple sites: Secondary | ICD-10-CM | POA: Diagnosis not present

## 2016-06-07 DIAGNOSIS — D708 Other neutropenia: Secondary | ICD-10-CM | POA: Diagnosis not present

## 2016-06-08 DIAGNOSIS — C8218 Follicular lymphoma grade II, lymph nodes of multiple sites: Secondary | ICD-10-CM | POA: Diagnosis not present

## 2016-07-02 DIAGNOSIS — C8218 Follicular lymphoma grade II, lymph nodes of multiple sites: Secondary | ICD-10-CM | POA: Diagnosis not present

## 2016-07-02 DIAGNOSIS — D701 Agranulocytosis secondary to cancer chemotherapy: Secondary | ICD-10-CM | POA: Diagnosis not present

## 2016-07-02 DIAGNOSIS — Z515 Encounter for palliative care: Secondary | ICD-10-CM | POA: Diagnosis not present

## 2016-07-06 DIAGNOSIS — D709 Neutropenia, unspecified: Secondary | ICD-10-CM | POA: Diagnosis not present

## 2016-07-06 DIAGNOSIS — C8218 Follicular lymphoma grade II, lymph nodes of multiple sites: Secondary | ICD-10-CM | POA: Diagnosis not present

## 2016-07-30 DIAGNOSIS — C8218 Follicular lymphoma grade II, lymph nodes of multiple sites: Secondary | ICD-10-CM | POA: Diagnosis not present

## 2016-08-13 DIAGNOSIS — Z5111 Encounter for antineoplastic chemotherapy: Secondary | ICD-10-CM | POA: Diagnosis not present

## 2016-08-13 DIAGNOSIS — C8218 Follicular lymphoma grade II, lymph nodes of multiple sites: Secondary | ICD-10-CM | POA: Diagnosis not present

## 2016-09-10 DIAGNOSIS — D701 Agranulocytosis secondary to cancer chemotherapy: Secondary | ICD-10-CM | POA: Diagnosis not present

## 2016-09-10 DIAGNOSIS — C8218 Follicular lymphoma grade II, lymph nodes of multiple sites: Secondary | ICD-10-CM | POA: Diagnosis not present

## 2016-09-10 DIAGNOSIS — Z5111 Encounter for antineoplastic chemotherapy: Secondary | ICD-10-CM | POA: Diagnosis not present

## 2016-09-17 DIAGNOSIS — C8218 Follicular lymphoma grade II, lymph nodes of multiple sites: Secondary | ICD-10-CM | POA: Diagnosis not present

## 2016-09-20 DIAGNOSIS — C8218 Follicular lymphoma grade II, lymph nodes of multiple sites: Secondary | ICD-10-CM | POA: Diagnosis not present

## 2016-09-24 DIAGNOSIS — C8218 Follicular lymphoma grade II, lymph nodes of multiple sites: Secondary | ICD-10-CM | POA: Diagnosis not present

## 2016-09-24 DIAGNOSIS — Z5111 Encounter for antineoplastic chemotherapy: Secondary | ICD-10-CM | POA: Diagnosis not present

## 2016-10-13 DIAGNOSIS — Z2821 Immunization not carried out because of patient refusal: Secondary | ICD-10-CM | POA: Diagnosis not present

## 2016-10-13 DIAGNOSIS — C8290 Follicular lymphoma, unspecified, unspecified site: Secondary | ICD-10-CM | POA: Diagnosis not present

## 2016-10-13 DIAGNOSIS — K219 Gastro-esophageal reflux disease without esophagitis: Secondary | ICD-10-CM | POA: Diagnosis not present

## 2016-10-13 DIAGNOSIS — R5081 Fever presenting with conditions classified elsewhere: Secondary | ICD-10-CM | POA: Diagnosis present

## 2016-10-13 DIAGNOSIS — R509 Fever, unspecified: Secondary | ICD-10-CM | POA: Diagnosis not present

## 2016-10-13 DIAGNOSIS — D709 Neutropenia, unspecified: Secondary | ICD-10-CM | POA: Diagnosis not present

## 2016-10-13 DIAGNOSIS — F419 Anxiety disorder, unspecified: Secondary | ICD-10-CM | POA: Diagnosis not present

## 2016-10-13 DIAGNOSIS — R05 Cough: Secondary | ICD-10-CM | POA: Diagnosis not present

## 2016-10-13 DIAGNOSIS — J329 Chronic sinusitis, unspecified: Secondary | ICD-10-CM | POA: Diagnosis not present

## 2016-10-21 DIAGNOSIS — C8218 Follicular lymphoma grade II, lymph nodes of multiple sites: Secondary | ICD-10-CM | POA: Diagnosis not present

## 2016-10-21 DIAGNOSIS — C8590 Non-Hodgkin lymphoma, unspecified, unspecified site: Secondary | ICD-10-CM | POA: Diagnosis not present

## 2016-10-22 DIAGNOSIS — C8290 Follicular lymphoma, unspecified, unspecified site: Secondary | ICD-10-CM | POA: Diagnosis not present

## 2016-10-22 DIAGNOSIS — J069 Acute upper respiratory infection, unspecified: Secondary | ICD-10-CM | POA: Diagnosis not present

## 2016-10-22 DIAGNOSIS — C8218 Follicular lymphoma grade II, lymph nodes of multiple sites: Secondary | ICD-10-CM | POA: Diagnosis not present

## 2016-11-05 DIAGNOSIS — C8218 Follicular lymphoma grade II, lymph nodes of multiple sites: Secondary | ICD-10-CM | POA: Diagnosis not present

## 2016-11-05 DIAGNOSIS — Z5111 Encounter for antineoplastic chemotherapy: Secondary | ICD-10-CM | POA: Diagnosis not present

## 2016-12-17 DIAGNOSIS — C8218 Follicular lymphoma grade II, lymph nodes of multiple sites: Secondary | ICD-10-CM | POA: Diagnosis not present

## 2017-01-28 DIAGNOSIS — C8218 Follicular lymphoma grade II, lymph nodes of multiple sites: Secondary | ICD-10-CM | POA: Diagnosis not present

## 2017-02-02 DIAGNOSIS — L03032 Cellulitis of left toe: Secondary | ICD-10-CM | POA: Insufficient documentation

## 2017-02-02 DIAGNOSIS — L600 Ingrowing nail: Secondary | ICD-10-CM | POA: Diagnosis not present

## 2017-02-16 DIAGNOSIS — L600 Ingrowing nail: Secondary | ICD-10-CM | POA: Diagnosis not present

## 2017-03-11 DIAGNOSIS — Z515 Encounter for palliative care: Secondary | ICD-10-CM | POA: Diagnosis not present

## 2017-03-11 DIAGNOSIS — C8218 Follicular lymphoma grade II, lymph nodes of multiple sites: Secondary | ICD-10-CM | POA: Diagnosis not present

## 2017-04-21 DIAGNOSIS — C8590 Non-Hodgkin lymphoma, unspecified, unspecified site: Secondary | ICD-10-CM | POA: Diagnosis not present

## 2017-04-21 DIAGNOSIS — C8218 Follicular lymphoma grade II, lymph nodes of multiple sites: Secondary | ICD-10-CM | POA: Diagnosis not present

## 2017-04-21 DIAGNOSIS — R918 Other nonspecific abnormal finding of lung field: Secondary | ICD-10-CM | POA: Diagnosis not present

## 2017-04-22 DIAGNOSIS — C8218 Follicular lymphoma grade II, lymph nodes of multiple sites: Secondary | ICD-10-CM | POA: Diagnosis not present

## 2017-04-22 DIAGNOSIS — Z23 Encounter for immunization: Secondary | ICD-10-CM | POA: Diagnosis not present

## 2017-06-03 DIAGNOSIS — C8218 Follicular lymphoma grade II, lymph nodes of multiple sites: Secondary | ICD-10-CM | POA: Diagnosis not present

## 2017-07-15 DIAGNOSIS — C8218 Follicular lymphoma grade II, lymph nodes of multiple sites: Secondary | ICD-10-CM | POA: Diagnosis not present

## 2017-08-26 DIAGNOSIS — Z515 Encounter for palliative care: Secondary | ICD-10-CM | POA: Diagnosis not present

## 2017-08-26 DIAGNOSIS — C8218 Follicular lymphoma grade II, lymph nodes of multiple sites: Secondary | ICD-10-CM | POA: Diagnosis not present

## 2017-09-29 DIAGNOSIS — I10 Essential (primary) hypertension: Secondary | ICD-10-CM | POA: Diagnosis not present

## 2017-09-29 DIAGNOSIS — J208 Acute bronchitis due to other specified organisms: Secondary | ICD-10-CM | POA: Diagnosis not present

## 2017-09-30 DIAGNOSIS — Z79899 Other long term (current) drug therapy: Secondary | ICD-10-CM | POA: Diagnosis not present

## 2017-09-30 DIAGNOSIS — C8218 Follicular lymphoma grade II, lymph nodes of multiple sites: Secondary | ICD-10-CM | POA: Diagnosis not present

## 2017-09-30 DIAGNOSIS — R05 Cough: Secondary | ICD-10-CM | POA: Diagnosis not present

## 2017-09-30 DIAGNOSIS — R0602 Shortness of breath: Secondary | ICD-10-CM | POA: Diagnosis not present

## 2017-10-03 DIAGNOSIS — R911 Solitary pulmonary nodule: Secondary | ICD-10-CM | POA: Diagnosis not present

## 2017-10-03 DIAGNOSIS — R918 Other nonspecific abnormal finding of lung field: Secondary | ICD-10-CM | POA: Diagnosis not present

## 2017-10-03 DIAGNOSIS — D72819 Decreased white blood cell count, unspecified: Secondary | ICD-10-CM | POA: Diagnosis not present

## 2017-10-03 DIAGNOSIS — C8218 Follicular lymphoma grade II, lymph nodes of multiple sites: Secondary | ICD-10-CM | POA: Diagnosis not present

## 2017-10-04 DIAGNOSIS — C8218 Follicular lymphoma grade II, lymph nodes of multiple sites: Secondary | ICD-10-CM | POA: Diagnosis not present

## 2017-10-05 DIAGNOSIS — C8218 Follicular lymphoma grade II, lymph nodes of multiple sites: Secondary | ICD-10-CM | POA: Diagnosis not present

## 2017-10-11 DIAGNOSIS — R918 Other nonspecific abnormal finding of lung field: Secondary | ICD-10-CM | POA: Diagnosis not present

## 2017-10-11 DIAGNOSIS — R911 Solitary pulmonary nodule: Secondary | ICD-10-CM | POA: Diagnosis not present

## 2017-10-11 DIAGNOSIS — B49 Unspecified mycosis: Secondary | ICD-10-CM | POA: Diagnosis not present

## 2017-10-11 DIAGNOSIS — D696 Thrombocytopenia, unspecified: Secondary | ICD-10-CM | POA: Diagnosis not present

## 2017-10-11 DIAGNOSIS — Z8572 Personal history of non-Hodgkin lymphomas: Secondary | ICD-10-CM | POA: Diagnosis not present

## 2017-10-20 DIAGNOSIS — B450 Pulmonary cryptococcosis: Secondary | ICD-10-CM | POA: Diagnosis not present

## 2017-10-20 DIAGNOSIS — C8218 Follicular lymphoma grade II, lymph nodes of multiple sites: Secondary | ICD-10-CM | POA: Diagnosis not present

## 2017-10-20 DIAGNOSIS — Z515 Encounter for palliative care: Secondary | ICD-10-CM | POA: Diagnosis not present

## 2017-10-24 DIAGNOSIS — Z5111 Encounter for antineoplastic chemotherapy: Secondary | ICD-10-CM | POA: Diagnosis not present

## 2017-10-24 DIAGNOSIS — C8218 Follicular lymphoma grade II, lymph nodes of multiple sites: Secondary | ICD-10-CM | POA: Diagnosis not present

## 2017-10-25 DIAGNOSIS — I10 Essential (primary) hypertension: Secondary | ICD-10-CM | POA: Diagnosis not present

## 2017-11-03 ENCOUNTER — Ambulatory Visit: Payer: Medicare Other | Admitting: Internal Medicine

## 2017-11-14 ENCOUNTER — Ambulatory Visit (INDEPENDENT_AMBULATORY_CARE_PROVIDER_SITE_OTHER): Payer: Medicare Other | Admitting: Internal Medicine

## 2017-11-14 ENCOUNTER — Ambulatory Visit
Admission: RE | Admit: 2017-11-14 | Discharge: 2017-11-14 | Disposition: A | Discharge status: Home / Self Care | Payer: Medicare Other | Source: Ambulatory Visit | Attending: Internal Medicine | Admitting: Internal Medicine

## 2017-11-14 VITALS — BP 123/86 | HR 94 | Temp 98.3°F | Wt 278.0 lb

## 2017-11-14 DIAGNOSIS — C8210 Follicular lymphoma grade II, unspecified site: Secondary | ICD-10-CM

## 2017-11-14 DIAGNOSIS — B450 Pulmonary cryptococcosis: Secondary | ICD-10-CM

## 2017-11-14 DIAGNOSIS — R918 Other nonspecific abnormal finding of lung field: Secondary | ICD-10-CM | POA: Diagnosis not present

## 2017-11-14 NOTE — Progress Notes (Signed)
RFV: crypotococcal pneumonia  Patient ID: Michael Woods, male   DOB: 07/30/1975, 42 y.o.   MRN: [id number]  HPI Michael Woods is a 42yo M with history of follicular lymphoma - currently on revlimid ( q 3 wks, then off) plus rituxan ( q 6 wk)- has been on it - almost 3 years. First diagnosed 2011. Presented with abdominal pain with LN initially. He was referred by dr Michael Woods, his oncologist from [facility]. He had a bout of pneumonia with productive cough, SOB and chest CT showing lung infiltrate vs. Mass. Then gave him IV abtx as outpaitnet.In right upper lobe. He underwent biopsy and found to have cryptococcus on path. His SCr Ag was 1:512. He was started on fluconazole for the past 3 weeks.  Cryptococcoma.   Had CT scan in October that showed initial area then dx as mass -> cryptoccoma.  Not noitced anything difference. But wife reports feels that coughing is slightly improved.no fever, chills, nightsweats HIV testing is negative. Did fungal blood cx as well.  I have reviewed his outside records  Family: Mother and father hx of leukemia  Sochx: no smoking or alcohol use.  ROS: occasionall dizzy since BP meds changed. 12 point ROS is otherwise negative  Outpatient Encounter Medications as of 11/14/2017  Medication Sig  . fluconazole (DIFLUCAN) 200 MG tablet Take 200 mg by mouth daily.  . fluticasone (FLONASE) 50 MCG/ACT nasal spray   . HYDROcodone-ibuprofen (VICOPROFEN) 7.5-200 MG tablet Take 1 tablet by mouth every 8 (eight) hours as needed for moderate pain.  Marland Kitchen lenalidomide (REVLIMID) 20 MG capsule   . olmesartan (BENICAR) 20 MG tablet Take 20 mg by mouth daily.  . pregabalin (LYRICA) 150 MG capsule    No facility-administered encounter medications on file as of 11/14/2017.      There are no active problems to display for this patient.    Health Maintenance Due  Topic Date Due  . HIV Screening  04/28/1992  . TETANUS/TDAP  04/28/1996     Physical Exam   BP  123/86   Pulse 94   Temp 98.3 F (36.8 C) (Oral)   Wt 278 lb (126.1 kg)   Physical Exam  Constitutional: He is oriented to person, place, and time. He appears well-developed and well-nourished. No distress.  HENT:  Mouth/Throat: Oropharynx is clear and moist. No oropharyngeal exudate.  Cardiovascular: Normal rate, regular rhythm and normal heart sounds. Exam reveals no gallop and no friction rub.  No murmur heard.  Pulmonary/Chest: Effort normal and breath sounds normal. No respiratory distress. He has no wheezes.  Abdominal: Soft. Bowel sounds are normal. He exhibits no distension. There is no tenderness.  Lymphadenopathy:  He has no cervical adenopathy.  Neurological: He is alert and oriented to person, place, and time.  Skin: Skin is warm and dry. No rash noted. No erythema.  Psychiatric: He has a normal mood and affect. His behavior is normal.      Assessment and Plan Pulmonary cryptococcal pneumonia in immunocompromised host - need brain  mri and lumbar puncture. May need to do it in [place] - will get labs here and cxr to see if any improvement - crypto titer unchanged at 1:512. But only 3 weeks of treatment - cxr mildly improved per my read - continue on oral fluconazole 400mg  daily but may need iv abtx induction - await to see results of CSF - discussed plan with dr Michael Woods. We will arrange for studies and LP to be done through [place]  [facility] - return to clinic in 4 wk  Spent 60 min in discussion of treatment plan and coordination of care with dr Michael Woods

## 2017-11-15 DIAGNOSIS — C8218 Follicular lymphoma grade II, lymph nodes of multiple sites: Secondary | ICD-10-CM | POA: Diagnosis not present

## 2017-11-15 DIAGNOSIS — Z8572 Personal history of non-Hodgkin lymphomas: Secondary | ICD-10-CM | POA: Diagnosis not present

## 2017-11-15 LAB — COMPLETE METABOLIC PANEL WITH GFR
AG Ratio: 2.5 (calc) (ref 1.0–2.5)
ALBUMIN MSPROF: 4.2 g/dL (ref 3.6–5.1)
ALT: 88 U/L — AB (ref 9–46)
AST: 46 U/L — ABNORMAL HIGH (ref 10–40)
Alkaline phosphatase (APISO): 74 U/L (ref 40–115)
BUN: 7 mg/dL (ref 7–25)
CHLORIDE: 101 mmol/L (ref 98–110)
CO2: 29 mmol/L (ref 20–32)
CREATININE: 1.06 mg/dL (ref 0.60–1.35)
Calcium: 9.1 mg/dL (ref 8.6–10.3)
GFR, Est African American: 101 mL/min/{1.73_m2} (ref >=60)
GFR, Est Non African American: 87 mL/min/{1.73_m2} (ref >=60)
GLUCOSE: 131 mg/dL — AB (ref 65–99)
Globulin: 1.7 g/dL (calc) — ABNORMAL LOW (ref 1.9–3.7)
Potassium: 3.5 mmol/L (ref 3.5–5.3)
Sodium: 139 mmol/L (ref 135–146)
Total Bilirubin: 0.5 mg/dL (ref 0.2–1.2)
Total Protein: 5.9 g/dL — ABNORMAL LOW (ref 6.1–8.1)

## 2017-11-16 DIAGNOSIS — I10 Essential (primary) hypertension: Secondary | ICD-10-CM | POA: Diagnosis not present

## 2017-11-16 LAB — CRYPTOCOCCAL AG, LTX SCR RFLX TITER
Cryptococcal Ag Screen: DETECTED — CR
Cryptococcal Ag Titer: 1:512 {titer} — AB
MICRO NUMBER: 90579920
SPECIMEN QUALITY: ADEQUATE

## 2017-12-01 DIAGNOSIS — C8218 Follicular lymphoma grade II, lymph nodes of multiple sites: Secondary | ICD-10-CM | POA: Diagnosis not present

## 2017-12-01 DIAGNOSIS — Z79899 Other long term (current) drug therapy: Secondary | ICD-10-CM | POA: Diagnosis not present

## 2017-12-01 DIAGNOSIS — B457 Disseminated cryptococcosis: Secondary | ICD-10-CM | POA: Diagnosis not present

## 2017-12-01 DIAGNOSIS — D701 Agranulocytosis secondary to cancer chemotherapy: Secondary | ICD-10-CM | POA: Diagnosis not present

## 2017-12-03 HISTORY — PX: LUMBAR PUNCTURE: SHX1985

## 2017-12-09 DIAGNOSIS — Z5111 Encounter for antineoplastic chemotherapy: Secondary | ICD-10-CM | POA: Diagnosis not present

## 2017-12-09 DIAGNOSIS — C8218 Follicular lymphoma grade II, lymph nodes of multiple sites: Secondary | ICD-10-CM | POA: Diagnosis not present

## 2017-12-09 DIAGNOSIS — B451 Cerebral cryptococcosis: Secondary | ICD-10-CM | POA: Diagnosis not present

## 2017-12-12 DIAGNOSIS — B451 Cerebral cryptococcosis: Secondary | ICD-10-CM | POA: Diagnosis not present

## 2017-12-12 DIAGNOSIS — R930 Abnormal findings on diagnostic imaging of skull and head, not elsewhere classified: Secondary | ICD-10-CM | POA: Diagnosis not present

## 2017-12-15 ENCOUNTER — Telehealth: Payer: Self-pay | Admitting: *Deleted

## 2017-12-16 DIAGNOSIS — C8218 Follicular lymphoma grade II, lymph nodes of multiple sites: Secondary | ICD-10-CM | POA: Diagnosis not present

## 2017-12-16 DIAGNOSIS — R42 Dizziness and giddiness: Secondary | ICD-10-CM | POA: Diagnosis not present

## 2017-12-16 DIAGNOSIS — B451 Cerebral cryptococcosis: Secondary | ICD-10-CM | POA: Diagnosis not present

## 2017-12-16 DIAGNOSIS — R51 Headache: Secondary | ICD-10-CM | POA: Diagnosis not present

## 2017-12-16 DIAGNOSIS — G971 Other reaction to spinal and lumbar puncture: Secondary | ICD-10-CM | POA: Diagnosis not present

## 2017-12-16 NOTE — Telephone Encounter (Signed)
RN requested lumbar puncture lab results from [facility] per Dr Storm Frisk request. Patient's CSF was negative for cryptococcal antigen. Labs placed in Dr Storm Frisk box for review. Landis Gandy, RN

## 2018-01-02 ENCOUNTER — Ambulatory Visit (INDEPENDENT_AMBULATORY_CARE_PROVIDER_SITE_OTHER): Payer: Medicare Other | Admitting: Internal Medicine

## 2018-01-02 ENCOUNTER — Encounter: Payer: Self-pay | Admitting: Internal Medicine

## 2018-01-02 VITALS — BP 133/85 | HR 87 | Temp 98.4°F | Wt 283.0 lb

## 2018-01-02 DIAGNOSIS — B450 Pulmonary cryptococcosis: Secondary | ICD-10-CM

## 2018-01-02 MED ORDER — FLUCONAZOLE 200 MG PO TABS: 400.0000 mg | ORAL_TABLET | Freq: Every day | ORAL | 5 refills | Status: DC

## 2018-01-02 NOTE — Progress Notes (Signed)
  RFV: follow up for pulmonary cryptococcal infection  Patient ID: Michael Woods, male   DOB: 01/10/1976, 42 y.o.   MRN: [id number]  HPI Michael Woods is a 42yo M with pulmonary cryptococcal disease, on fluconazole 400mg . Since we last saw him, he did undergo CSF analysis to rule out CNS cryptococcal disease. He did have normal CSF, normal protein and glucose. He did suffer from post LP HA and found to have csf leak requiring blood patch. Otherwise reports being fatigued. He did have infusion from management of follicular lymphoma. Outpatient Encounter Medications as of 01/02/2018  Medication Sig  . fluconazole (DIFLUCAN) 200 MG tablet Take 2 tablets (400 mg total) by mouth daily.  . fluticasone (FLONASE) 50 MCG/ACT nasal spray   . HYDROcodone-ibuprofen (VICOPROFEN) 7.5-200 MG tablet Take 1 tablet by mouth every 8 (eight) hours as needed for moderate pain.  Marland Kitchen lenalidomide (REVLIMID) 20 MG capsule   . olmesartan (BENICAR) 20 MG tablet Take 20 mg by mouth daily.  . pregabalin (LYRICA) 150 MG capsule   . [DISCONTINUED] fluconazole (DIFLUCAN) 200 MG tablet Take 200 mg by mouth daily.   No facility-administered encounter medications on file as of 01/02/2018.      There are no active problems to display for this patient.    Health Maintenance Due  Topic Date Due  . HIV Screening  04/28/1992  . TETANUS/TDAP  04/28/1996     Review of Systems  Physical Exam   BP 133/85   Pulse 87   Temp 98.4 F (36.9 C) (Oral)   Wt 283 lb (128.4 kg)   Physical Exam  Constitutional: He is oriented to person, place, and time. He appears well-developed and well-nourished. No distress.  HENT:  Mouth/Throat: Oropharynx is clear and moist. No oropharyngeal exudate.  Cardiovascular: Normal rate, regular rhythm and normal heart sounds. Exam reveals no gallop and no friction rub.  No murmur heard.  Pulmonary/Chest: Effort normal and breath sounds normal. No respiratory distress. He has no wheezes.  Abdominal:  Soft. Bowel sounds are normal. He exhibits no distension. There is no tenderness.  Lymphadenopathy:  He has no cervical adenopathy.  Neurological: He is alert and oriented to person, place, and time.  Skin: Skin is warm and dry. No rash noted. No erythema.  Psychiatric: He has a normal mood and affect. His behavior is normal.   BMET Lab Results  Component Value Date   NA 139 11/14/2017   K 3.5 11/14/2017   CL 101 11/14/2017   CO2 29 11/14/2017   GLUCOSE 131 (H) 11/14/2017   BUN 7 11/14/2017   CREATININE 1.06 11/14/2017   CALCIUM 9.1 11/14/2017   GFRNONAA 87 11/14/2017   GFRAA 101 11/14/2017      Assessment and Plan Pulmonary cryptococcal disease - continue with fluconazole 400mg  po day.  - will check cmp, and cryptococcal antigen  Follicular lymphoma = will ask oncologist if can defer any infusions that can affect t cell response ------------ Addendum: Serum Cr much more elevated than last month, but patient is asymptomatic for pulmonary symptoms. Will get cxr. May plan to do induction with IV ampho

## 2018-01-03 LAB — HEPATIC FUNCTION PANEL
AG RATIO: 2.7 (calc) — AB (ref 1.0–2.5)
ALBUMIN MSPROF: 4.6 g/dL (ref 3.6–5.1)
ALT: 155 U/L — ABNORMAL HIGH (ref 9–46)
AST: 79 U/L — ABNORMAL HIGH (ref 10–40)
Alkaline phosphatase (APISO): 71 U/L (ref 40–115)
BILIRUBIN DIRECT: 0.1 mg/dL (ref 0.0–0.2)
BILIRUBIN INDIRECT: 0.5 mg/dL (ref 0.2–1.2)
BILIRUBIN TOTAL: 0.6 mg/dL (ref 0.2–1.2)
GLOBULIN: 1.7 g/dL — AB (ref 1.9–3.7)
Total Protein: 6.3 g/dL (ref 6.1–8.1)

## 2018-01-04 LAB — CRYPTOCOCCAL AG, LTX SCR RFLX TITER
Cryptococcal Ag Screen: DETECTED — CR
Cryptococcal Ag Titer: 1:4096 {titer}
MICRO NUMBER: 90781475
SPECIMEN QUALITY:: ADEQUATE

## 2018-01-10 ENCOUNTER — Telehealth: Payer: Self-pay | Admitting: Internal Medicine

## 2018-01-10 NOTE — Telephone Encounter (Signed)
Left msg to discuss plan to admit to hospital for iv abtx

## 2018-01-11 ENCOUNTER — Encounter (HOSPITAL_COMMUNITY): Payer: Self-pay | Admitting: Internal Medicine

## 2018-01-11 ENCOUNTER — Ambulatory Visit (INDEPENDENT_AMBULATORY_CARE_PROVIDER_SITE_OTHER): Payer: Medicare Other | Admitting: Internal Medicine

## 2018-01-11 ENCOUNTER — Inpatient Hospital Stay (HOSPITAL_COMMUNITY): Payer: Medicare Other

## 2018-01-11 ENCOUNTER — Other Ambulatory Visit: Payer: Self-pay

## 2018-01-11 ENCOUNTER — Inpatient Hospital Stay (HOSPITAL_COMMUNITY)
Admission: AD | Admit: 2018-01-11 | Discharge: 2018-01-14 | DRG: 867 | Disposition: A | Discharge status: Home Health | Payer: Medicare Other | Source: Ambulatory Visit | Attending: Family Medicine | Admitting: Family Medicine

## 2018-01-11 ENCOUNTER — Telehealth: Payer: Self-pay

## 2018-01-11 VITALS — BP 141/94 | HR 91 | Temp 97.8°F | Wt 287.0 lb

## 2018-01-11 DIAGNOSIS — K219 Gastro-esophageal reflux disease without esophagitis: Secondary | ICD-10-CM | POA: Diagnosis present

## 2018-01-11 DIAGNOSIS — Z8701 Personal history of pneumonia (recurrent): Secondary | ICD-10-CM

## 2018-01-11 DIAGNOSIS — D701 Agranulocytosis secondary to cancer chemotherapy: Secondary | ICD-10-CM | POA: Diagnosis present

## 2018-01-11 DIAGNOSIS — T451X5A Adverse effect of antineoplastic and immunosuppressive drugs, initial encounter: Secondary | ICD-10-CM | POA: Diagnosis present

## 2018-01-11 DIAGNOSIS — R740 Nonspecific elevation of levels of transaminase and lactic acid dehydrogenase [LDH]: Secondary | ICD-10-CM

## 2018-01-11 DIAGNOSIS — J984 Other disorders of lung: Secondary | ICD-10-CM | POA: Diagnosis not present

## 2018-01-11 DIAGNOSIS — E669 Obesity, unspecified: Secondary | ICD-10-CM | POA: Diagnosis present

## 2018-01-11 DIAGNOSIS — E1159 Type 2 diabetes mellitus with other circulatory complications: Secondary | ICD-10-CM | POA: Insufficient documentation

## 2018-01-11 DIAGNOSIS — Z95828 Presence of other vascular implants and grafts: Secondary | ICD-10-CM | POA: Diagnosis not present

## 2018-01-11 DIAGNOSIS — B450 Pulmonary cryptococcosis: Principal | ICD-10-CM | POA: Diagnosis present

## 2018-01-11 DIAGNOSIS — Z9221 Personal history of antineoplastic chemotherapy: Secondary | ICD-10-CM

## 2018-01-11 DIAGNOSIS — C8210 Follicular lymphoma grade II, unspecified site: Secondary | ICD-10-CM | POA: Diagnosis not present

## 2018-01-11 DIAGNOSIS — D709 Neutropenia, unspecified: Secondary | ICD-10-CM | POA: Diagnosis not present

## 2018-01-11 DIAGNOSIS — C8290 Follicular lymphoma, unspecified, unspecified site: Secondary | ICD-10-CM | POA: Diagnosis present

## 2018-01-11 DIAGNOSIS — Z8249 Family history of ischemic heart disease and other diseases of the circulatory system: Secondary | ICD-10-CM | POA: Diagnosis not present

## 2018-01-11 DIAGNOSIS — I10 Essential (primary) hypertension: Secondary | ICD-10-CM | POA: Insufficient documentation

## 2018-01-11 DIAGNOSIS — R4182 Altered mental status, unspecified: Secondary | ICD-10-CM | POA: Diagnosis not present

## 2018-01-11 DIAGNOSIS — M62830 Muscle spasm of back: Secondary | ICD-10-CM | POA: Diagnosis not present

## 2018-01-11 DIAGNOSIS — G629 Polyneuropathy, unspecified: Secondary | ICD-10-CM | POA: Diagnosis present

## 2018-01-11 DIAGNOSIS — Z6835 Body mass index (BMI) 35.0-35.9, adult: Secondary | ICD-10-CM

## 2018-01-11 DIAGNOSIS — J168 Pneumonia due to other specified infectious organisms: Secondary | ICD-10-CM | POA: Diagnosis present

## 2018-01-11 DIAGNOSIS — R7401 Elevation of levels of liver transaminase levels: Secondary | ICD-10-CM

## 2018-01-11 DIAGNOSIS — J189 Pneumonia, unspecified organism: Secondary | ICD-10-CM

## 2018-01-11 DIAGNOSIS — Z888 Allergy status to other drugs, medicaments and biological substances status: Secondary | ICD-10-CM | POA: Diagnosis not present

## 2018-01-11 DIAGNOSIS — B999 Unspecified infectious disease: Secondary | ICD-10-CM | POA: Insufficient documentation

## 2018-01-11 HISTORY — DX: Personal history of other diseases of the digestive system: Z87.19

## 2018-01-11 HISTORY — DX: Pulmonary cryptococcosis: B45.0

## 2018-01-11 HISTORY — DX: Personal history of peptic ulcer disease: Z87.11

## 2018-01-11 HISTORY — DX: Low back pain: M54.5

## 2018-01-11 HISTORY — DX: Follicular lymphoma, unspecified, unspecified site: C82.90

## 2018-01-11 HISTORY — DX: Social phobia, unspecified: F40.10

## 2018-01-11 HISTORY — DX: Other specified postprocedural states: Z98.890

## 2018-01-11 HISTORY — DX: Polyneuropathy, unspecified: G62.9

## 2018-01-11 HISTORY — DX: Other chronic pain: G89.29

## 2018-01-11 HISTORY — DX: Personal history of other diseases of the musculoskeletal system and connective tissue: Z87.39

## 2018-01-11 HISTORY — DX: Low back pain, unspecified: M54.50

## 2018-01-11 HISTORY — DX: Essential (primary) hypertension: I10

## 2018-01-11 HISTORY — DX: Gastro-esophageal reflux disease without esophagitis: K21.9

## 2018-01-11 HISTORY — DX: Other specified postprocedural states: R11.2

## 2018-01-11 HISTORY — DX: Pneumonia, unspecified organism: J18.9

## 2018-01-11 LAB — COMPREHENSIVE METABOLIC PANEL
ALBUMIN: 3.8 g/dL (ref 3.5–5.0)
ALK PHOS: 58 U/L (ref 38–126)
ALT: 133 U/L — ABNORMAL HIGH (ref 0–44)
AST: 78 U/L — AB (ref 15–41)
Anion gap: 9 (ref 5–15)
BILIRUBIN TOTAL: 0.8 mg/dL (ref 0.3–1.2)
BUN: 7 mg/dL (ref 6–20)
CALCIUM: 8.9 mg/dL (ref 8.9–10.3)
CO2: 26 mmol/L (ref 22–32)
Chloride: 105 mmol/L (ref 98–111)
Creatinine, Ser: 1.17 mg/dL (ref 0.61–1.24)
GFR calc Af Amer: >60 mL/min (ref >60)
GFR calc non Af Amer: >60 mL/min (ref >60)
Glucose, Bld: 99 mg/dL (ref 70–99)
Potassium: 3.2 mmol/L — ABNORMAL LOW (ref 3.5–5.1)
Sodium: 140 mmol/L (ref 135–145)
TOTAL PROTEIN: 5.5 g/dL — AB (ref 6.5–8.1)

## 2018-01-11 LAB — CBC WITH DIFFERENTIAL/PLATELET
BASOS ABS: 0.1 10*3/uL (ref 0.0–0.1)
BASOS PCT: 3 %
EOS ABS: 0.1 10*3/uL (ref 0.0–0.7)
Eosinophils Relative: 2 %
HEMATOCRIT: 38.9 % — AB (ref 39.0–52.0)
Hemoglobin: 13.2 g/dL (ref 13.0–17.0)
LYMPHS ABS: 0.9 10*3/uL (ref 0.7–4.0)
Lymphocytes Relative: 34 %
MCH: 33.3 pg (ref 26.0–34.0)
MCHC: 33.9 g/dL (ref 30.0–36.0)
MCV: 98.2 fL (ref 78.0–100.0)
MONOS PCT: 26 %
Monocytes Absolute: 0.7 10*3/uL (ref 0.1–1.0)
NEUTROS ABS: 0.8 10*3/uL — AB (ref 1.7–7.7)
Neutrophils Relative %: 35 %
Platelets: 197 10*3/uL (ref 150–400)
RBC: 3.96 MIL/uL — ABNORMAL LOW (ref 4.22–5.81)
RDW: 15.5 % (ref 11.5–15.5)
WBC: 2.6 10*3/uL — ABNORMAL LOW (ref 4.0–10.5)

## 2018-01-11 MED ORDER — IRBESARTAN 75 MG PO TABS: 150.0000 mg | ORAL_TABLET | Freq: Every day | ORAL | Status: DC

## 2018-01-11 MED ORDER — LENALIDOMIDE 20 MG PO CAPS: 20.0000 mg | ORAL_CAPSULE | Freq: Every day | ORAL | Status: DC

## 2018-01-11 MED ORDER — ACETAMINOPHEN 325 MG PO TABS: 650.0000 mg | ORAL_TABLET | Freq: Every day | ORAL | Status: DC | PRN

## 2018-01-11 MED ORDER — SODIUM CHLORIDE 0.9% FLUSH
10.0000 mL | INTRAVENOUS | Status: DC | PRN
  Administered 2018-01-14: 10 mL
  Filled 2018-01-11: qty 40

## 2018-01-11 MED ORDER — ACETAMINOPHEN 325 MG PO TABS
650.0000 mg | ORAL_TABLET | Freq: Four times a day (QID) | ORAL | Status: DC | PRN
  Administered 2018-01-11 – 2018-01-12 (×2): 650 mg via ORAL
  Filled 2018-01-11 (×3): qty 2

## 2018-01-11 MED ORDER — ENOXAPARIN SODIUM 40 MG/0.4ML ~~LOC~~ SOLN
40.0000 mg | SUBCUTANEOUS | Status: DC
  Administered 2018-01-11: 40 mg via SUBCUTANEOUS
  Filled 2018-01-11: qty 0.4

## 2018-01-11 MED ORDER — SODIUM CHLORIDE 0.9 % IV BOLUS FOR AMBISOME
500.0000 mL | Freq: Once | INTRAVENOUS | Status: AC
  Administered 2018-01-11: 500 mL via INTRAVENOUS
  Filled 2018-01-11: qty 500

## 2018-01-11 MED ORDER — HYDROCODONE-IBUPROFEN 7.5-200 MG PO TABS: 1.0000 | ORAL_TABLET | Freq: Three times a day (TID) | ORAL | Status: DC | PRN

## 2018-01-11 MED ORDER — PREGABALIN 75 MG PO CAPS: 150.0000 mg | ORAL_CAPSULE | Freq: Every day | ORAL | Status: DC

## 2018-01-11 MED ORDER — SODIUM CHLORIDE 0.9 % IV SOLN
INTRAVENOUS | Status: AC
  Administered 2018-01-11 (×2): via INTRAVENOUS

## 2018-01-11 MED ORDER — DEXTROSE 5 % IV SOLN: 4.0000 mg/kg | INTRAVENOUS | Status: DC

## 2018-01-11 MED ORDER — SODIUM CHLORIDE 0.9 % IV BOLUS FOR AMBISOME
500.0000 mL | INTRAVENOUS | Status: DC
  Filled 2018-01-11: qty 500

## 2018-01-11 MED ORDER — ONDANSETRON HCL 4 MG PO TABS
4.0000 mg | ORAL_TABLET | Freq: Four times a day (QID) | ORAL | Status: DC | PRN
Start: 2018-01-11 — End: 2018-01-14

## 2018-01-11 MED ORDER — SODIUM CHLORIDE 0.9 % IV BOLUS FOR AMBISOME
500.0000 mL | INTRAVENOUS | Status: DC
  Administered 2018-01-12 – 2018-01-14 (×4): 500 mL via INTRAVENOUS
  Filled 2018-01-11 (×3): qty 500

## 2018-01-11 MED ORDER — IRBESARTAN 75 MG PO TABS
150.0000 mg | ORAL_TABLET | Freq: Every day | ORAL | Status: DC
  Administered 2018-01-11 – 2018-01-13 (×3): 150 mg via ORAL
  Filled 2018-01-11 (×3): qty 2

## 2018-01-11 MED ORDER — DIPHENHYDRAMINE HCL 25 MG PO CAPS
25.0000 mg | ORAL_CAPSULE | Freq: Every day | ORAL | Status: DC | PRN
Start: 2018-01-11 — End: 2018-01-14
  Filled 2018-01-11 (×2): qty 1

## 2018-01-11 MED ORDER — ACETAMINOPHEN 650 MG RE SUPP: 650.0000 mg | Freq: Four times a day (QID) | RECTAL | Status: DC | PRN

## 2018-01-11 MED ORDER — MEPERIDINE HCL 25 MG/ML IJ SOLN
25.0000 mg | INTRAMUSCULAR | Status: DC | PRN
Start: 2018-01-11 — End: 2018-01-14

## 2018-01-11 MED ORDER — DEXTROSE 5% FOR FLUSHING BEFORE AND AFTER AMBISOME
10.0000 mL | INTRAVENOUS | Status: DC
  Filled 2018-01-11: qty 50

## 2018-01-11 MED ORDER — FLUTICASONE PROPIONATE 50 MCG/ACT NA SUSP
1.0000 | Freq: Every day | NASAL | Status: DC
  Administered 2018-01-11 – 2018-01-13 (×3): 1 via NASAL

## 2018-01-11 MED ORDER — SODIUM CHLORIDE 0.9 % IV BOLUS FOR AMBISOME
500.0000 mL | INTRAVENOUS | Status: DC
Start: 2018-01-11 — End: 2018-01-11
  Filled 2018-01-11: qty 500

## 2018-01-11 MED ORDER — DEXTROSE 5 % IV SOLN
4.0000 mg/kg | INTRAVENOUS | Status: DC
  Administered 2018-01-11: 520 mg via INTRAVENOUS
  Filled 2018-01-11: qty 520

## 2018-01-11 MED ORDER — SODIUM CHLORIDE 0.9 % IV BOLUS FOR AMBISOME
500.0000 mL | INTRAVENOUS | Status: DC
  Administered 2018-01-12 – 2018-01-13 (×2): 500 mL via INTRAVENOUS
  Filled 2018-01-11 (×3): qty 500

## 2018-01-11 MED ORDER — PREGABALIN 75 MG PO CAPS
150.0000 mg | ORAL_CAPSULE | Freq: Every day | ORAL | Status: DC
  Administered 2018-01-11 – 2018-01-13 (×3): 150 mg via ORAL
  Filled 2018-01-11 (×3): qty 2

## 2018-01-11 MED ORDER — SODIUM CHLORIDE 0.9 % IV BOLUS FOR AMBISOME
500.0000 mL | INTRAVENOUS | Status: DC
  Administered 2018-01-11: 500 mL via INTRAVENOUS
  Filled 2018-01-11: qty 500

## 2018-01-11 MED ORDER — DEXTROSE 5% FOR FLUSHING BEFORE AND AFTER AMBISOME
10.0000 mL | INTRAVENOUS | Status: DC
  Administered 2018-01-11 – 2018-01-14 (×4): 10 mL via INTRAVENOUS
  Filled 2018-01-11 (×4): qty 50

## 2018-01-11 MED ORDER — ONDANSETRON HCL 4 MG/2ML IJ SOLN: 4.0000 mg | Freq: Four times a day (QID) | INTRAMUSCULAR | Status: DC | PRN

## 2018-01-11 MED ORDER — SODIUM CHLORIDE 0.9% FLUSH: 10.0000 mL | Freq: Two times a day (BID) | INTRAVENOUS | Status: DC

## 2018-01-11 MED ORDER — FLUTICASONE PROPIONATE 50 MCG/ACT NA SUSP
1.0000 | Freq: Every day | NASAL | Status: DC
  Filled 2018-01-11: qty 16

## 2018-01-11 MED ORDER — DIPHENHYDRAMINE HCL 50 MG/ML IJ SOLN: 25.0000 mg | Freq: Every day | INTRAMUSCULAR | Status: DC | PRN

## 2018-01-11 NOTE — Progress Notes (Signed)
Pt arrived to room.  Pt had questions about Dr. Renard Hamper would be his attending and had questions about that.  [facility] admissions assigned Dr. Renard Hamper as attending. No orders at this time.

## 2018-01-11 NOTE — Progress Notes (Signed)
RFV: follow up for pulmonary cryptococcal infection  Patient ID: Michael Woods, male   DOB: 1975/07/09, 42 y.o.   MRN: [id number]  HPI Michael Woods is a 42yo M with recurrent follicular lymphoa, currently on revlimid/rituxan. Being followed by Dr Bobby Rumpf in [facility] oncologist. He started to have recurrent pneumonia, found to have RUL infiltrate.  He underwent biopsy and found to have cryptococcus on path. His SCr Ag was 1:512. He was started on fluconazole 400mg  daily in late April. He has been relatively asymptomatic. He underwent brain MRI, nad CSF analysis that did not show Evidence of CSF cryptococcal meningitis. His csf cx are NGTD for both aerobic and fungal cx from 6/10. We saw him last week and repeat Cr Ag which is now even higher. He did report getting infusion recently from his oncologist. Due to concern for not responding to oral fluconazole, asked to come to clinic for direct admission to the hospital.  Outpatient Encounter Medications as of 01/11/2018  Medication Sig  . fluconazole (DIFLUCAN) 200 MG tablet Take 2 tablets (400 mg total) by mouth daily.  . fluticasone (FLONASE) 50 MCG/ACT nasal spray   . HYDROcodone-ibuprofen (VICOPROFEN) 7.5-200 MG tablet Take 1 tablet by mouth every 8 (eight) hours as needed for moderate pain.  Marland Kitchen lenalidomide (REVLIMID) 20 MG capsule   . olmesartan (BENICAR) 20 MG tablet Take 20 mg by mouth daily.  . pregabalin (LYRICA) 150 MG capsule    No facility-administered encounter medications on file as of 01/11/2018.      Patient Active Problem List   Diagnosis Date Noted  . Benign essential hypertension 01/11/2018  . Follicular lymphoma ([place]) 10/26/2012     Health Maintenance Due  Topic Date Due  . HIV Screening  04/28/1992  . TETANUS/TDAP  04/28/1996     Review of Systems Review of Systems  Constitutional: Negative for fever, chills, diaphoresis, activity change, appetite change, fatigue and unexpected weight change.  HENT: Negative for  congestion, sore throat, rhinorrhea, sneezing, trouble swallowing and sinus pressure.  Eyes: Negative for photophobia and visual disturbance.  Respiratory: Negative for cough, chest tightness, shortness of breath, wheezing and stridor.  Cardiovascular: Negative for chest pain, palpitations and leg swelling.  Gastrointestinal: Negative for nausea, vomiting, abdominal pain, diarrhea, constipation, blood in stool, abdominal distention and anal bleeding.  Genitourinary: Negative for dysuria, hematuria, flank pain and difficulty urinating.  Musculoskeletal: Negative for myalgias, back pain, joint swelling, arthralgias and gait problem.  Skin: Negative for color change, pallor, rash and wound.  Neurological: Negative for dizziness, tremors, weakness and light-headedness.  Hematological: Negative for adenopathy. Does not bruise/bleed easily.  Psychiatric/Behavioral: Negative for behavioral problems, confusion, sleep disturbance, dysphoric mood, decreased concentration and agitation.     Physical Exam   BP (!) 141/94   Pulse 91   Temp 97.8 F (36.6 C) (Oral)   Wt 287 lb (130.2 kg)  Physical Exam  Constitutional: He is oriented to person, place, and time. He appears well-developed and well-nourished. No distress.  HENT:  Mouth/Throat: Oropharynx is clear and moist. No oropharyngeal exudate.  Cardiovascular: Normal rate, regular rhythm and normal heart sounds. Exam reveals no gallop and no friction rub.  No murmur heard.  Pulmonary/Chest: Effort normal and breath sounds normal. No respiratory distress. He has no wheezes.  Abdominal: Soft. Bowel sounds are normal. He exhibits no distension. There is no tenderness.  Lymphadenopathy:  He has no cervical adenopathy.  Neurological: He is alert and oriented to person, place, and time.  Skin: Skin is  warm and dry. No rash noted. No erythema.  Psychiatric: He has a normal mood and affect. His behavior is normal.     BMET Lab Results  Component  Value Date   NA 139 11/14/2017   K 3.5 11/14/2017   CL 101 11/14/2017   CO2 29 11/14/2017   GLUCOSE 131 (H) 11/14/2017   BUN 7 11/14/2017   CREATININE 1.06 11/14/2017   CALCIUM 9.1 11/14/2017   GFRNONAA 87 11/14/2017   GFRAA 101 11/14/2017      Assessment and Plan  Pulmonary cryptococcal disease =recommend to get cxr. Will get admitted needs liposomal amphotericin and flucytosine. Will let inpatient pharamcy  Follicular lymphoma = patient had one of his infusions roughly 17 days ago, he suspects his counts will be low? Recommend to check cbc with diff  Transaminitis= he has history of fatty liver, but his LFTs slightly elevated last week, maybe due to fluconazole. Hold furhter fluconazole

## 2018-01-11 NOTE — Telephone Encounter (Addendum)
Called [facility] bed control per Dr. Baxter Flattery to have pt admitted for Cryptococcal Pneumonia failure for o/p mgnt. Spoke with Shaun who was able to take my call. Bed control will call office once bed is available waiting on discharges. Admitting MD: Lorin Mercy Type of bed: General Medicare  Requested provider: Dr. Baxter Flattery Reason for Admission: Cryptococcal Pneumonia.   Will inform Dr. Baxter Flattery bed control is waiting on discharges before pt can be admitted. [place]

## 2018-01-11 NOTE — H&P (Signed)
History and Physical    Michael Woods JKD:[id number] DOB: Jan 08, 1976 DOA: 01/11/2018  PCP: Rochel Brome, MD Patient coming from: ID office  Chief Complaint: pulmonary cryptococcal disease  HPI: Michael Woods is a very pleasant 42 y.o. male with medical history significant for recurrent follicular lymphoma, recurrent pneumonia obesity,hypertension, on echo back pain presents to the fifth floor room 14 from infectious disease or Dr. Graylon Good with infectious disease. [facility] hospitalists are asked to admit.  Information is obtained from the chart and the patient. Chart review indicates patient started to have recurrent pneumonia found to have right upper lobe infiltrate. He underwent a biopsy and found to have Cryptococcus on path. His SCr AG was 1:512. He was started on fuconazole 400 mg daily in April. He underwent brain MRI had CSF analysis to did not show evidence of CSF cryptococcal meningitis. According to ID note csf cx are regular to date for both aerobic and fungal cx from 6/10. Was seen in the infectious disease clinic last week and that note indicates Cr Ag now higher. ID recommended direct admission to concern for Woods of response to oral fluconazole. Patient denies any headache dizziness syncope or near-syncope. He denies cough fever chills shortness of breath. He denies chest pain palpitations lower extremity edema. He denies any abdominal pain nausea vomiting diarrhea constipation melena bright red blood per rectum. No dysuria hematuria frequency or urgency.    ED Course: upon admission he is afebrile hemodynamically stable and not hypoxic  Review of Systems: As per HPI otherwise all other systems reviewed and are negative.   Ambulatory Status:ambulates independently is independent with ADLs  Past Medical History:  Diagnosis Date  . Chronic lower back pain    "pulled muscle; have bone spurs" (01/11/2018)  . Cryptococcal pneumonitis ([place])   . Follicular lymphoma ([place]) 2011      recurrent/notes 01/11/2018  . GERD (gastroesophageal reflux disease)    "food related" (01/11/2018)  . History of gout   . History of stomach ulcers 1990s  . HTN (hypertension)   . Pneumonia    recurrent/notes 01/11/2018  . PONV (postoperative nausea and vomiting)   . Social anxiety disorder     Past Surgical History:  Procedure Laterality Date  . AXILLARY LYMPH NODE BIOPSY Left 2011  . CARPAL TUNNEL RELEASE Right   . KNEE ARTHROSCOPY Bilateral   . LUMBAR PUNCTURE  12/2017    post LP HA and found to have csf leak requiring blood patch/notes 01/02/2018  . PORTA CATH INSERTION Left 2011  . TONSILLECTOMY      Social History   Socioeconomic History  . Marital status: Married    Spouse name: Not on file  . Number of children: Not on file  . Years of education: Not on file  . Highest education level: Not on file  Occupational History  . Not on file  Social Needs  . Financial resource strain: Not on file  . Food insecurity:    Worry: Not on file    Inability: Not on file  . Transportation needs:    Medical: Not on file    Non-medical: Not on file  Tobacco Use  . Smoking status: Former Smoker    Packs/day: 3.00    Years: 12.00    Pack years: 36.00    Types: Cigarettes    Last attempt to quit: 2007    Years since quitting: 12.5  . Smokeless tobacco: Never Used  Substance and Sexual Activity  . Alcohol use: Yes  Alcohol/week: 1.2 oz    Types: 2 Cans of beer per week    Frequency: Never  . Drug use: Not Currently  . Sexual activity: Yes  Lifestyle  . Physical activity:    Days per week: Not on file    Minutes per session: Not on file  . Stress: Not on file  Relationships  . Social connections:    Talks on phone: Not on file    Gets together: Not on file    Attends religious service: Not on file    Active member of club or organization: Not on file    Attends meetings of clubs or organizations: Not on file    Relationship status: Not on file  . Intimate  partner violence:    Fear of current or ex partner: Not on file    Emotionally abused: Not on file    Physically abused: Not on file    Forced sexual activity: Not on file  Other Topics Concern  . Not on file  Social History Narrative  . Not on file    Not on File  Family History  Problem Relation Age of Onset  . Hypertension Mother     Prior to Admission medications   Medication Sig Start Date End Date Taking? Authorizing Provider  fluconazole (DIFLUCAN) 200 MG tablet Take 2 tablets (400 mg total) by mouth daily. 01/02/18   Carlyle Basques, MD  fluticasone Asencion Islam) 50 MCG/ACT nasal spray  12/17/16   [provider]  HYDROcodone-ibuprofen (VICOPROFEN) 7.5-200 MG tablet Take 1 tablet by mouth every 8 (eight) hours as needed for moderate pain.    [provider]  lenalidomide (REVLIMID) 20 MG capsule  08/26/15   [provider]  olmesartan (BENICAR) 20 MG tablet Take 20 mg by mouth daily.    [provider]  pregabalin (LYRICA) 150 MG capsule  12/17/16   [provider]    Physical Exam: Vitals:   01/11/18 1428  BP: (!) 128/99  Pulse: 87  Resp: 18  Temp: 98.2 F (36.8 C)  TempSrc: Oral  SpO2: 96%     General:  Appears calm and comfortable sitting in chair and straight close in no acute distress Eyes:  PERRL, EOMI, normal lids, iris ENT:  grossly normal hearing, lips & tongue, mucous membranes of his mouth are moist and pink Neck:  no LAD, masses or thyromegaly Cardiovascular:  RRR, no m/r/g. No LE edema.  Respiratory:  CTA bilaterally, no w/r/r. Normal respiratory effort. Abdomen:  soft, ntnd, is a bowel sounds throughout no guarding or rebounding Skin:  no rash or induration seen on limited exam Musculoskeletal:  grossly normal tone BUE/BLE, good ROM, no bony abnormality Psychiatric:  grossly normal mood and affect, speech fluent and appropriate, AOx3 Neurologic:  CN 2-12 grossly intact, moves all extremities in coordinated  fashion, sensation intact  Labs on Admission: I have personally reviewed following labs and imaging studies  CBC: No results for input(s): WBC, NEUTROABS, HGB, HCT, MCV, PLT in the last 168 hours. Basic Metabolic Panel: No results for input(s): NA, K, CL, CO2, GLUCOSE, BUN, CREATININE, CALCIUM, MG, PHOS in the last 168 hours. GFR: CrCl cannot be calculated (Patient's most recent lab result is older than the maximum 21 days allowed.). Liver Function Tests: No results for input(s): AST, ALT, ALKPHOS, BILITOT, PROT, ALBUMIN in the last 168 hours. No results for input(s): LIPASE, AMYLASE in the last 168 hours. No results for input(s): AMMONIA in the last 168 hours. Coagulation Profile:  No results for input(s): INR, PROTIME in the last 168 hours. Cardiac Enzymes: No results for input(s): CKTOTAL, CKMB, CKMBINDEX, TROPONINI in the last 168 hours. BNP (last 3 results) No results for input(s): PROBNP in the last 8760 hours. HbA1C: No results for input(s): HGBA1C in the last 72 hours. CBG: No results for input(s): GLUCAP in the last 168 hours. Lipid Profile: No results for input(s): CHOL, HDL, LDLCALC, TRIG, CHOLHDL, LDLDIRECT in the last 72 hours. Thyroid Function Tests: No results for input(s): TSH, T4TOTAL, FREET4, T3FREE, THYROIDAB in the last 72 hours. Anemia Panel: No results for input(s): VITAMINB12, FOLATE, FERRITIN, TIBC, IRON, RETICCTPCT in the last 72 hours. Urine analysis: No results found for: COLORURINE, APPEARANCEUR, LABSPEC, PHURINE, GLUCOSEU, HGBUR, BILIRUBINUR, KETONESUR, PROTEINUR, UROBILINOGEN, NITRITE, LEUKOCYTESUR  Creatinine Clearance: CrCl cannot be calculated (Patient's most recent lab result is older than the maximum 21 days allowed.).  Sepsis Labs: @LABRCNTIP (procalcitonin:4,lacticidven:4) )No results found for this or any previous visit (from the past 240 hour(s)).   Radiological Exams on Admission: No results found.  EKG:   Assessment/Plan Principal  Problem:   Cryptococcal pneumonitis (HCC) Active Problems:   Benign essential hypertension   Follicular lymphoma (HCC)   Neuropathy   1. Pulmonary cryptococcal disease/cryptococcal pneumonitis. Per infectious disease. See history of present illness. -Admit -Obtain a chest x-ray -cmet and cbc with diff-amphotericin and flucytosine per pharmacy via Dr Bridget Hartshorn -monitor  #2. Hypertension. Control on admission.home medications include benicar -Continue home meds -Monitor  #3. Neuropathy. Likely related to chemotherapy. Appears stable at baseline. -Continue home meds  #4.folicular lymphoma.followed by oncology and aspirin -continue home meds   DVT prophylaxis: lovenox  Code Status: full  Family Communication: wife at bedside  Disposition Plan: home  Consults called: dr Graylon Good ID  Admission status: inpatient    Radene Gunning MD [facility]  If 7PM-7AM, please contact night-coverage [email or web address] Password [facility]  01/11/2018, 4:24 PM

## 2018-01-11 NOTE — Progress Notes (Signed)
Patient ID: Quincy Boy, male   DOB: 09/19/1975, 43 y.o.   MRN: [id number]          [facility]    Date of Admission:  01/11/2018   fluconazole for past 6 weeks  Mr. Freeland was admitted by my partner, Dr. Carlyle Basques, today.  Mr. Edman is undergoing chemotherapy for lymphoma.  Right upper lobe lung mass was recently found.  He underwent biopsy and apparently cryptococcus was found on path.  Serum cryptococcal antigen was positive and he was started on oral fluconazole about 6 weeks ago.  He has not had any problems tolerating it and denies missing doses.  He is feeling well.  He denies any significant cough, sputum production, shortness of breath, fever, chills, sweats or headaches.  A routine follow-up serum cryptococcal antigen went up to 1: 4096 leading to his admission today to start liposomal amphotericin and 5 flucytosine.  I will obtain a chest x-ray and baseline blood work and follow with you.         Michel Bickers, MD [place] [facility] [phone] pager   [phone] cell 01/11/2018, 3:38 PM

## 2018-01-11 NOTE — Progress Notes (Signed)
RN Otila Kluver reported to me that she had stopped Ambisome due to patient experienced sudden back pain. Patient describes back and side pain and different from any normal previous back pain almost like a spasm.  I discussed this medication with the patient and that it has a very high incidence of side effects including back pain (up to 12% per LexiComp). Patient is willing to try and continue the infusion due to the complexity and seriousness of his infection.  Haidynn Almendarez S. Alford Highland, PharmD, [place] Clinical Staff Pharmacist Pager [phone]

## 2018-01-11 NOTE — Progress Notes (Signed)
Pt had finished chemo pill 2 weeks ago and concerned about neutrophil count.

## 2018-01-11 NOTE — Progress Notes (Signed)
Pt has lymphoma and is on active treatment at this time.

## 2018-01-11 NOTE — Telephone Encounter (Signed)
Bed Control left a vm stating pt has a bed available he just needs to sign in at admissions at [facility]. Pt had to go home in [place] and would not be able to back in [place] until 1:30-2 pm. Called bed control Margaret at [facility] to give them a heads up that pt is on his way back to [place], but wont be able to be admitted until around 1:30-2. Joycelyn Schmid stated it would be okay as long as the pt showed up to admissions. Informed pt and Dr. Baxter Flattery that pt's bed will be on hold until arrival. Aundria Rud, CMA

## 2018-01-12 ENCOUNTER — Encounter (HOSPITAL_COMMUNITY): Payer: Self-pay

## 2018-01-12 DIAGNOSIS — M62830 Muscle spasm of back: Secondary | ICD-10-CM

## 2018-01-12 DIAGNOSIS — Z888 Allergy status to other drugs, medicaments and biological substances status: Secondary | ICD-10-CM

## 2018-01-12 DIAGNOSIS — B450 Pulmonary cryptococcosis: Principal | ICD-10-CM

## 2018-01-12 LAB — BASIC METABOLIC PANEL
ANION GAP: 8 (ref 5–15)
BUN: 7 mg/dL (ref 6–20)
CO2: 25 mmol/L (ref 22–32)
CREATININE: 1.14 mg/dL (ref 0.61–1.24)
Calcium: 7.8 mg/dL — ABNORMAL LOW (ref 8.9–10.3)
Chloride: 108 mmol/L (ref 98–111)
GFR calc non Af Amer: >60 mL/min (ref >60)
Glucose, Bld: 135 mg/dL — ABNORMAL HIGH (ref 70–99)
POTASSIUM: 3 mmol/L — AB (ref 3.5–5.1)
SODIUM: 141 mmol/L (ref 135–145)

## 2018-01-12 LAB — PATHOLOGIST SMEAR REVIEW: Path Review: REACTIVE

## 2018-01-12 LAB — MAGNESIUM: MAGNESIUM: 1.8 mg/dL (ref 1.7–2.4)

## 2018-01-12 LAB — HIV ANTIBODY (ROUTINE TESTING W REFLEX): HIV Screen 4th Generation wRfx: NONREACTIVE

## 2018-01-12 MED ORDER — POTASSIUM CHLORIDE CRYS ER 20 MEQ PO TBCR
20.0000 meq | EXTENDED_RELEASE_TABLET | Freq: Three times a day (TID) | ORAL | Status: AC
  Administered 2018-01-12 (×3): 20 meq via ORAL
  Filled 2018-01-12 (×3): qty 1

## 2018-01-12 MED ORDER — FLUCYTOSINE 250 MG PO CAPS
25.0000 mg/kg | ORAL_CAPSULE | Freq: Four times a day (QID) | ORAL | Status: DC
  Administered 2018-01-12 – 2018-01-14 (×9): 3250 mg via ORAL
  Filled 2018-01-12 (×10): qty 13

## 2018-01-12 MED ORDER — FLUCYTOSINE 500 MG PO CAPS
25.0000 mg/kg | ORAL_CAPSULE | Freq: Four times a day (QID) | ORAL | Status: DC
  Filled 2018-01-12 (×5): qty 1

## 2018-01-12 MED ORDER — DEXTROSE 5 % IV SOLN
500.0000 mg | INTRAVENOUS | Status: DC
  Administered 2018-01-12 – 2018-01-14 (×3): 500 mg via INTRAVENOUS
  Filled 2018-01-12 (×3): qty 500

## 2018-01-12 MED ORDER — ENOXAPARIN SODIUM 60 MG/0.6ML ~~LOC~~ SOLN
60.0000 mg | SUBCUTANEOUS | Status: DC
  Administered 2018-01-12: 60 mg via SUBCUTANEOUS
  Filled 2018-01-12: qty 0.6

## 2018-01-12 NOTE — Progress Notes (Signed)
PHARMACY CONSULT NOTE FOR:  OUTPATIENT  PARENTERAL ANTIBIOTIC THERAPY (OPAT)  Indication: Pulmonary Cryptococcus Regimen: Liposomal Amphotericin B (Ambisome) 500 mg IV every 24 hours End date: 01/25/18  IV antibiotic discharge orders are pended. To discharging provider:  please sign these orders via discharge navigator,  Select New Orders & click on the button choice - Manage This Unsigned Work.     Thank you for allowing pharmacy to be a part of this patient's care.  Lawson Radar 01/12/2018, 10:34 PM

## 2018-01-12 NOTE — Progress Notes (Signed)
ANTIBIOTIC CONSULT NOTE - INITIAL  Pharmacy Consult for Liposomal Amphotericin B and flucytosine Indication: Cryptococcal pneumonia  Allergies  Allergen Reactions  . Clonidine Other (See Comments)    Altered mental status, pt blacks out    Patient Measurements: Weight: 287 lb 0.1 oz (130.2 kg)  Vital Signs: Temp: 97.3 F (36.3 C) (07/11 0438) Temp Source: Oral (07/11 0438) BP: 125/92 (07/11 0438) Pulse Rate: 75 (07/11 0438) Intake/Output from previous day: 07/10 0701 - 07/11 0700 In: 937.3 [P.O.:120; I.V.:317.3; IV Piggyback:500] Out: 0  Intake/Output from this shift: No intake/output data recorded.  Labs: Recent Labs    01/11/18 1440 01/11/18 1530 01/12/18 0328  WBC  --  2.6*  --   HGB  --  13.2  --   PLT  --  197  --   CREATININE 1.17  --  1.14   K = 3, Mg = 1.8  Microbiology: No results found for this or any previous visit (from the past 720 hour(s)).  Medical History: Past Medical History:  Diagnosis Date  . Chronic lower back pain    "pulled muscle; have bone spurs" (01/11/2018)  . Cryptococcal pneumonitis ([place])   . Follicular lymphoma ([place]) 2011    recurrent/notes 01/11/2018  . GERD (gastroesophageal reflux disease)    "food related" (01/11/2018)  . History of gout   . History of stomach ulcers 1990s  . HTN (hypertension)   . Neuropathy   . Pneumonia    recurrent/notes 01/11/2018  . PONV (postoperative nausea and vomiting)   . Social anxiety disorder     Medications:  Medications Prior to Admission  Medication Sig Dispense Refill Last Dose  . fluconazole (DIFLUCAN) 200 MG tablet Take 2 tablets (400 mg total) by mouth daily. 60 tablet 5 01/10/2018 at Unknown time  . fluticasone (FLONASE) 50 MCG/ACT nasal spray Place 2 sprays into both nostrils daily.    01/10/2018 at Unknown time  . HYDROcodone-ibuprofen (VICOPROFEN) 7.5-200 MG tablet Take 1 tablet by mouth every 8 (eight) hours as needed for moderate pain.   Past Week at Unknown time  . lenalidomide  (REVLIMID) 20 MG capsule Take 20 mg by mouth See admin instructions. Take one tablet daily for 21 days then hold for 21 days, repeat.   Taking  . loratadine (CLARITIN) 10 MG tablet Take 10 mg by mouth daily.   01/10/2018 at Unknown time  . olmesartan (BENICAR) 20 MG tablet Take 20 mg by mouth daily.   01/10/2018 at Unknown time  . pregabalin (LYRICA) 150 MG capsule Take 150 mg by mouth 2 (two) times daily.    01/10/2018 at Unknown time   Assessment: 42 year old man on fluconazole 400mg  daily for cryptococcal pneumonia.  He is being treated for recurrent follicular lymphoma.  Pharmacy has been asked to help with Ambisome and flucytosine dosing and monitoring  Goal of Therapy:  Treating infection and monitor for adverse events  Plan:  Ambisome 4mg /kg (500mg ) daily.  Give Saline 568mL before and after infusion to help prevent nephrotoxicity.  Daily BMET and Mg. KDur 75meq TID today for K=3 Start flucytosine 3250mg  po q6H today - monitor CBC periodically.  Candie Mile 01/12/2018,8:31 AM

## 2018-01-12 NOTE — Progress Notes (Signed)
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  The plan is to give him a 2-week induction course of IV liposomal amphotericin and oral flucytosine.  The amphotericin can be administered through his Port-A-Cath.  If he tolerates his first few doses here we will arrange for him to complete therapy as an outpatient at home with very close follow-up.  He will need to have very careful monitoring of his renal function, potassium, magnesium and CBC.  He is leukopenic and slightly neutropenic from his latest round of chemotherapy for lymphoma.  He also has some liver enzyme elevation which may be caused by his recent fluconazole therapy.  PLAN: 1. Continue amphotericin and flucytosine  Diagnosis: Pulmonary cryptococcoma  Culture Result: None available  Allergies  Allergen Reactions  . Clonidine Other (See Comments)    Altered mental status, pt blacks out    OPAT Orders Discharge antibiotics: Per pharmacy protocol liposomal amphotericin  Duration: 2 weeks End Date: 01/25/2018  [facility] Care Per Protocol:  Labs weekly while on IV antibiotics: _x_ CBC with differential __ BMP _x_ CMP __ CRP __ ESR __ Vancomycin trough  _NA_ Please pull PIC at completion of IV antibiotics __ Please leave PIC in place until doctor has seen patient or been notified  Fax weekly labs to [phone]  Clinic Follow Up Appt: 02/06/2018   Principal Problem:   Cryptococcal pneumonitis ([place]) Active Problems:   Benign essential hypertension   Follicular lymphoma ([place])   Neuropathy   Scheduled Meds: . dextrose  10 mL Intravenous Q24H  . dextrose  10 mL Intravenous Q24H  . enoxaparin (LOVENOX) injection  60 mg Subcutaneous Q24H  . flucytosine  25 mg/kg Oral Q6H  .  fluticasone  1 spray Each Nare QHS  . irbesartan  150 mg Oral QHS  . potassium chloride  20 mEq Oral TID  . pregabalin  150 mg Oral QHS  . sodium chloride  500 mL Intravenous Q24H  . sodium chloride  500 mL Intravenous Q24H  . sodium chloride flush  10-40 mL Intracatheter Q12H   Continuous Infusions: . sodium chloride 50 mL/hr at 01/11/18 2223  . amphotericin  B  Liposome (AMBISOME) ADULT IV     PRN Meds:.acetaminophen **OR** acetaminophen, diphenhydrAMINE **OR** diphenhydrAMINE, HYDROcodone-ibuprofen, meperidine (DEMEROL) injection, ondansetron **OR** ondansetron (ZOFRAN) IV, sodium chloride flush   SUBJECTIVE: He is feeling well.  He denies any cough, shortness of breath or headache.  He had some mild, transient back spasms when he received his first dose of amphotericin last night.  They resolve spontaneously.  Review of Systems: Review of Systems  Constitutional: Negative for chills, diaphoresis, fever and weight loss.  Respiratory: Negative for cough, hemoptysis, sputum production and shortness of breath.   Cardiovascular: Negative for chest pain.  Gastrointestinal: Negative for abdominal pain, diarrhea, nausea and vomiting.  Neurological: Negative for headaches.    Allergies  Allergen Reactions  . Clonidine Other (See Comments)    Altered mental status, pt blacks out    OBJECTIVE: Vitals:   01/11/18 1729 01/11/18 2127 01/12/18 0438 01/12/18 0938  BP: 128/90 139/90 (!) 125/92 127/90  Pulse: 76 79 75 76  Resp: 18 20 16  18  Temp: 98.1 F (36.7 C) 98.4 F (36.9 C) (!) 97.3 F (36.3 C) 98.1 F (36.7 C)  TempSrc: Oral Oral Oral Oral  SpO2: 98% 97% 95% 98%  Weight:  287 lb 0.1 oz (130.2 kg)     There is no height or weight on file to calculate BMI.  Physical Exam  Constitutional: He is oriented to person, place, and time.  He is resting quietly in bed watching a movie on his computer.  Neck: Neck supple.  Cardiovascular: Normal rate, regular rhythm and normal  heart sounds.  No murmur heard. Pulmonary/Chest: Effort normal and breath sounds normal.    Neurological: He is alert and oriented to person, place, and time.  Skin: No rash noted.  Psychiatric: He has a normal mood and affect.    Lab Results Lab Results  Component Value Date   WBC 2.6 (L) 01/11/2018   HGB 13.2 01/11/2018   HCT 38.9 (L) 01/11/2018   MCV 98.2 01/11/2018   PLT 197 01/11/2018    Lab Results  Component Value Date   CREATININE 1.14 01/12/2018   BUN 7 01/12/2018   NA 141 01/12/2018   K 3.0 (L) 01/12/2018   CL 108 01/12/2018   CO2 25 01/12/2018    Lab Results  Component Value Date   ALT 133 (H) 01/11/2018   AST 78 (H) 01/11/2018   ALKPHOS 58 01/11/2018   BILITOT 0.8 01/11/2018    CXR 01/11/2018  IMPRESSION: Improvement in right apical nodular density, decreased in both size and density, consistent with response to interval therapy. Continued follow-up recommended.    By: Michael Woods M.D.   Michel Bickers, MD [facility] [phone] pager   [phone] cell 01/12/2018, 3:58 PM

## 2018-01-12 NOTE — Care Management Note (Signed)
Case Management Note  Patient Details  Name: Michael Woods MRN: [id number] Date of Birth: 1975-10-09  Subjective/Objective:                    Action/Plan:  Discuss discharge plan with patient at bedside. Home IV ABX.   Will need home health RN order and OPAT prescription Expected Discharge Date:                  Expected Discharge Plan:  [place]  In-House Referral:     Discharge planning Services  CM Consult  Post Acute Care Choice:  Home Health Choice offered to:  Patient  DME Arranged:  N/A DME Agency:  NA  HH Arranged:  RN [place] Agency:  [place]  Status of Service:  In process, will continue to follow  If discussed at Long Length of Stay Meetings, dates discussed:    Additional Comments:  Marilu Favre, RN 01/12/2018, 4:19 PM

## 2018-01-12 NOTE — Progress Notes (Signed)
Patient Demographics:    Kareem Cathey, is a 42 y.o. male, DOB - 1975-08-04, QMK:[id number]  Admit date - 01/11/2018   Admitting Physician Karmen Bongo, MD  Outpatient Primary MD for the patient is Cox, Elnita Maxwell, MD  LOS - 1   No chief complaint on file.       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  The amphotericin can be administered through his Port-A-Cath.  If he tolerates his first few doses here we will arrange for him to complete therapy as an outpatient at home with very close follow-up.  He will need to have very careful monitoring of his renal function, potassium, magnesium and CBC.  He is leukopenic and slightly neutropenic from his latest round of chemotherapy for lymphoma.  He also has some liver enzyme elevation which may be caused by his recent fluconazole therapy".   2)H/o Lymphoma--status post chemotherapy with Rituxan and Revlimid, leukopenia and neutropenia noted as well as elevated LFTs  3)HTN--stable, continue Avapro 150 mg daily  Code Status : Full    Disposition Plan  : home   Consults   :  ID   DVT Prophylaxis  :  Lovenox -  Lab Results  Component Value Date   PLT 197 01/11/2018    Inpatient Medications  Scheduled Meds: . dextrose  10 mL Intravenous Q24H  . dextrose  10 mL Intravenous Q24H  . enoxaparin (LOVENOX) injection  60 mg Subcutaneous Q24H  . flucytosine  25 mg/kg Oral Q6H  . fluticasone  1 spray Each Nare QHS  . irbesartan  150 mg Oral QHS  . potassium chloride  20 mEq Oral TID  . pregabalin  150 mg Oral QHS  . sodium chloride  500 mL Intravenous Q24H  . sodium chloride  500 mL Intravenous Q24H  . sodium chloride flush  10-40 mL Intracatheter Q12H   Continuous Infusions: . sodium chloride 50 mL/hr at 01/11/18 2223  . amphotericin  B  Liposome (AMBISOME) ADULT IV 500 mg (01/12/18 1822)   PRN Meds:.acetaminophen **OR** acetaminophen, diphenhydrAMINE **OR** diphenhydrAMINE, HYDROcodone-ibuprofen, meperidine (DEMEROL) injection, ondansetron **OR** ondansetron (ZOFRAN) IV, sodium chloride flush    Anti-infectives (From admission, onward)   Start     Dose/Rate Route Frequency Ordered Stop   01/12/18 1700  amphotericin B liposome (AMBISOME) 520 mg in dextrose 5 % 500 mL IVPB  Status:  Discontinued     4 mg/kg  130.2 kg 250 mL/hr over 120 Minutes Intravenous Every 24 hours 01/11/18 1901  01/12/18 0808   01/12/18 1700  amphotericin B liposome (AMBISOME) 500 mg in dextrose 5 % 500 mL IVPB     500 mg 250 mL/hr over 120 Minutes Intravenous Every 24 hours 01/12/18 0808     01/12/18 1400  flucytosine (ANCOBON) capsule 3,250 mg     25 mg/kg  130.2 kg Oral Every 6 hours 01/12/18 1332     01/12/18 0815  flucytosine (ANCOBON) capsule 3,250 mg  Status:  Discontinued     25 mg/kg  130.2 kg Oral Every 6 hours 01/12/18 0812 01/12/18 1333   01/11/18 1700  amphotericin B liposome (AMBISOME) 520 mg in dextrose 5 % 500 mL IVPB  Status:  Discontinued     4 mg/kg  130.2 kg 250 mL/hr over 120 Minutes Intravenous Every 24 hours 01/11/18 1455 01/11/18 1856         Objective:   Vitals:   01/11/18 2127 01/12/18 0438 01/12/18 0938 01/12/18 1702  BP: 139/90 (!) 125/92 127/90 125/87  Pulse: 79 75 76 69  Resp: 20 16 18 18   Temp: 98.4 F (36.9 C) (!) 97.3 F (36.3 C) 98.1 F (36.7 C) 98.2 F (36.8 C)  TempSrc: Oral Oral Oral Oral  SpO2: 97% 95% 98% 98%  Weight: 130.2 kg (287 lb 0.1 oz)       Wt Readings from Last 3 Encounters:  01/11/18 130.2 kg (287 lb 0.1 oz)  01/11/18 130.2 kg (287 lb)  01/02/18 128.4 kg (283 lb)     Intake/Output Summary (Last 24 hours) at 01/12/2018 1828 Last data filed at 01/12/2018 1400 Gross per 24 hour  Intake 2126.1 ml  Output 3 ml  Net 2123.1 ml     Physical Exam  Gen:- Awake Alert,  obese HEENT:- [place].AT, No sclera icterus Neck-Supple Neck,No JVD,.  Lungs-  CTAB , fairly symmetrical air movement CV- S1, S2 normal, PorthAcath site is clean dry and intact Abd-  +ve B.Sounds, Abd Soft, No tenderness,    Extremity/Skin:- No  edema, good pulses Psych-affect is appropriate, oriented x3 Neuro-no new focal deficits, no tremors   Data Review:   Micro Results No results found for this or any previous visit (from the past 240 hour(s)).  Radiology Reports Dg Chest 2 View  Result Date: 01/11/2018 CLINICAL DATA:  Follow up right apical lesion. Reported fungal infection on biopsy (per patient). On antibiotic therapy. History of lymphoma. EXAM: CHEST - 2 VIEW COMPARISON:  Radiographs 11/14/2017 and 10/11/2017.  CT 09/30/2017. FINDINGS: Left subclavian Port-A-Cath appears unchanged near the superior cavoatrial junction. The heart size and mediastinal contours are stable. Right apical nodular density has decreased in size and density. No new or enlarging nodules or confluent airspace opacities are present. There is no pleural effusion or pneumothorax. Surgical clips are present in the left axilla. IMPRESSION: Improvement in right apical nodular density, decreased in both size and density, consistent with response to  interval therapy. Continued follow-up recommended. Electronically Signed   By: Richardean Sale M.D.   On: 01/11/2018 16:24     CBC Recent Labs  Lab 01/11/18 1530  WBC 2.6*  HGB 13.2  HCT 38.9*  PLT 197  MCV 98.2  MCH 33.3  MCHC 33.9  RDW 15.5  LYMPHSABS 0.9  MONOABS 0.7  EOSABS 0.1  BASOSABS 0.1    Chemistries  Recent Labs  Lab 01/11/18 1440 01/12/18 0328  NA 140 141  K 3.2* 3.0*  CL 105 108  CO2 26 25  GLUCOSE 99 135*  BUN 7 7  CREATININE 1.17 1.14  CALCIUM 8.9 7.8*  MG  --  1.8  AST 78*  --   ALT 133*  --   ALKPHOS 58  --   BILITOT 0.8  --    ------------------------------------------------------------------------------------------------------------------ No results for input(s): CHOL, HDL, LDLCALC, TRIG, CHOLHDL, LDLDIRECT in the last 72 hours.  No results found for: HGBA1C ------------------------------------------------------------------------------------------------------------------ No results for input(s): TSH, T4TOTAL, T3FREE, THYROIDAB in the last 72 hours.  Invalid input(s): FREET3 ------------------------------------------------------------------------------------------------------------------ No results for input(s): VITAMINB12, FOLATE, FERRITIN, TIBC, IRON, RETICCTPCT in the last 72 hours.  Coagulation profile No results for input(s): INR, PROTIME in the last 168 hours.  No results for input(s): DDIMER in the last 72 hours.  Cardiac Enzymes No results for input(s): CKMB, TROPONINI, MYOGLOBIN in the last 168 hours.  Invalid input(s): CK ------------------------------------------------------------------------------------------------------------------ No results found for: BNP   Roxan Hockey M.D on 01/12/2018 at 6:28 PM  Between 7am to 7pm - Pager - [phone]  After 7pm go to [email or web address] - password [facility]  [facility] -  Office  [phone]

## 2018-01-13 DIAGNOSIS — Z95828 Presence of other vascular implants and grafts: Secondary | ICD-10-CM

## 2018-01-13 LAB — BASIC METABOLIC PANEL
ANION GAP: 10 (ref 5–15)
BUN: 7 mg/dL (ref 6–20)
CALCIUM: 8.5 mg/dL — AB (ref 8.9–10.3)
CO2: 26 mmol/L (ref 22–32)
Chloride: 107 mmol/L (ref 98–111)
Creatinine, Ser: 1.03 mg/dL (ref 0.61–1.24)
Glucose, Bld: 107 mg/dL — ABNORMAL HIGH (ref 70–99)
Potassium: 3.5 mmol/L (ref 3.5–5.1)
Sodium: 143 mmol/L (ref 135–145)

## 2018-01-13 LAB — MAGNESIUM: MAGNESIUM: 2.1 mg/dL (ref 1.7–2.4)

## 2018-01-13 MED ORDER — POTASSIUM CHLORIDE CRYS ER 20 MEQ PO TBCR
20.0000 meq | EXTENDED_RELEASE_TABLET | ORAL | Status: AC
Start: 2018-01-13 — End: 2018-01-13
  Administered 2018-01-13 (×2): 20 meq via ORAL
  Filled 2018-01-13 (×2): qty 1

## 2018-01-13 NOTE — Progress Notes (Signed)
Patient Demographics:    Michael Woods, is a 42 y.o. male, DOB - 1976-01-10, BSJ:[id number]  Admit date - 01/11/2018   Admitting Physician Karmen Bongo, MD  Outpatient Primary MD for the patient is Cox, Elnita Maxwell, MD  LOS - 2   No chief complaint on file.       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  The amphotericin can be administered through his Port-A-Cath.  If he tolerates his first few doses here we will arrange for him to complete therapy as an outpatient at home with very close follow-up.  He will need to have very careful monitoring of his renal function, potassium, magnesium and CBC.  He is leukopenic and slightly neutropenic from his latest round of chemotherapy for lymphoma.  He also has some liver enzyme elevation which may be caused by his recent fluconazole therapy".  Tolerating infusion of amphotericin well at this time, home health agency will not be able to start infusions until Sunday, 01/15/2018 so plan on discharge home on 01/14/2018 after infusion   2)H/o Lymphoma--status post chemotherapy with Rituxan and Revlimid, leukopenia and neutropenia noted  as well as elevated LFTs  3)HTN--stable, continue Avapro 150 mg daily  Code Status : Full    Disposition Plan  : home  With [facility]  Consults  :  ID  DVT Prophylaxis  :  Lovenox -  Lab Results  Component Value Date   PLT 197 01/11/2018    Inpatient Medications  Scheduled Meds: . dextrose  10 mL Intravenous Q24H  . dextrose  10 mL Intravenous Q24H  . enoxaparin (LOVENOX) injection  60 mg Subcutaneous Q24H  . flucytosine  25 mg/kg Oral Q6H  . fluticasone  1 spray Each Nare QHS  . irbesartan  150 mg Oral QHS  . pregabalin  150 mg Oral QHS  . sodium chloride  500 mL Intravenous Q24H  . sodium chloride  500 mL Intravenous Q24H  . sodium chloride flush  10-40 mL Intracatheter Q12H   Continuous Infusions: . amphotericin  B  Liposome (AMBISOME) ADULT IV 500 mg (01/12/18 1822)   PRN Meds:.acetaminophen **OR** acetaminophen, diphenhydrAMINE **OR** diphenhydrAMINE, HYDROcodone-ibuprofen, meperidine (DEMEROL) injection, ondansetron **OR** ondansetron (ZOFRAN) IV, sodium chloride flush    Anti-infectives (From admission, onward)   Start     Dose/Rate Route Frequency Ordered Stop   01/12/18 1700  amphotericin B liposome (AMBISOME) 520 mg in dextrose 5 % 500 mL IVPB  Status:  Discontinued     4 mg/kg  130.2 kg 250 mL/hr over 120 Minutes  Intravenous Every 24 hours 01/11/18 1901 01/12/18 0808   01/12/18 1700  amphotericin B liposome (AMBISOME) 500 mg in dextrose 5 % 500 mL IVPB     500 mg 250 mL/hr over 120 Minutes Intravenous Every 24 hours 01/12/18 0808     01/12/18 1400  flucytosine (ANCOBON) capsule 3,250 mg     25 mg/kg  130.2 kg Oral Every 6 hours 01/12/18 1332     01/12/18 0815  flucytosine (ANCOBON) capsule 3,250 mg  Status:  Discontinued     25 mg/kg  130.2 kg Oral Every 6 hours 01/12/18 0812 01/12/18 1333   01/11/18 1700  amphotericin B liposome (AMBISOME) 520 mg in dextrose 5 % 500 mL IVPB  Status:  Discontinued     4 mg/kg  130.2 kg 250 mL/hr over 120 Minutes  Intravenous Every 24 hours 01/11/18 1455 01/11/18 1856        Objective:   Vitals:   01/12/18 2039 01/13/18 0509 01/13/18 0928 01/13/18 1632  BP: 126/88 108/73 123/86 (!) 130/95  Pulse: 79 64 62 75  Resp: 16 16 14 20   Temp: 98.4 F (36.9 C) 97.9 F (36.6 C) (!) 97.4 F (36.3 C) 98.4 F (36.9 C)  TempSrc: Oral Oral Oral Oral  SpO2: 97% 97% 100% 97%  Weight:      Height:        Wt Readings from Last 3 Encounters:  01/12/18 130.2 kg (287 lb)  01/11/18 130.2 kg (287 lb)  01/02/18 128.4 kg (283 lb)     Intake/Output Summary (Last 24 hours) at 01/13/2018 1727 Last data filed at 01/13/2018 0900 Gross per 24 hour  Intake 480 ml  Output 0 ml  Net 480 ml     Physical Exam  Gen:- Awake Alert,  obese HEENT:- [place].AT, No sclera icterus Neck-Supple Neck,No JVD,.  Lungs-  CTAB , fairly symmetrical air movement CV- S1, S2 normal, left subclavian area PorthAcath site is clean dry and intact Abd-  +ve B.Sounds, Abd Soft, No tenderness,    Extremity/Skin:- No  edema, good pulses Psych-affect is appropriate, oriented x3 Neuro-no new focal deficits, no tremors   Data Review:   Micro Results No results found for this or any previous visit (from the past 240 hour(s)).  Radiology Reports Dg Chest 2 View  Result Date: 01/11/2018 CLINICAL DATA:  Follow up right apical lesion. Reported fungal infection on biopsy (per patient). On antibiotic therapy. History of lymphoma. EXAM: CHEST - 2 VIEW COMPARISON:  Radiographs 11/14/2017 and 10/11/2017.  CT 09/30/2017. FINDINGS: Left subclavian Port-A-Cath appears unchanged near the superior cavoatrial junction. The heart size and mediastinal contours are stable. Right apical nodular density has decreased in size and density. No new or enlarging nodules or confluent airspace opacities are present. There is no pleural effusion or pneumothorax. Surgical clips are present in the left axilla. IMPRESSION: Improvement in right apical nodular density,  decreased in both size and density, consistent with response to interval therapy. Continued follow-up recommended. Electronically Signed   By: Richardean Sale M.D.   On: 01/11/2018 16:24     CBC Recent Labs  Lab 01/11/18 1530  WBC 2.6*  HGB 13.2  HCT 38.9*  PLT 197  MCV 98.2  MCH 33.3  MCHC 33.9  RDW 15.5  LYMPHSABS 0.9  MONOABS 0.7  EOSABS 0.1  BASOSABS 0.1    Chemistries  Recent Labs  Lab 01/11/18 1440 01/12/18 0328 01/13/18 0517  NA 140 141 143  K 3.2* 3.0* 3.5  CL 105 108 107  CO2 26 25 26   GLUCOSE 99 135* 107*  BUN 7 7 7   CREATININE 1.17 1.14 1.03  CALCIUM 8.9 7.8* 8.5*  MG  --  1.8 2.1  AST 78*  --   --   ALT 133*  --   --   ALKPHOS 58  --   --   BILITOT 0.8  --   --    ------------------------------------------------------------------------------------------------------------------ No results for input(s): CHOL, HDL, LDLCALC, TRIG, CHOLHDL, LDLDIRECT in the last 72 hours.  No results found for: HGBA1C ------------------------------------------------------------------------------------------------------------------ No results for input(s): TSH, T4TOTAL, T3FREE, THYROIDAB in the last 72 hours.  Invalid input(s): FREET3 ------------------------------------------------------------------------------------------------------------------ No results for input(s): VITAMINB12, FOLATE, FERRITIN, TIBC, IRON, RETICCTPCT in the last 72 hours.  Coagulation profile No results for input(s): INR, PROTIME in the last 168 hours.  No results for input(s): DDIMER in the last 72 hours.  Cardiac Enzymes No results for input(s): CKMB, TROPONINI, MYOGLOBIN in the last 168 hours.  Invalid input(s): CK ------------------------------------------------------------------------------------------------------------------ No results found for: BNP   Roxan Hockey M.D on 01/13/2018 at 5:27 PM  Between 7am to 7pm - Pager - [phone]  After 7pm go to [email or web address] - password  [facility]  [facility] -  Office  [phone]

## 2018-01-13 NOTE — Care Management Important Message (Signed)
Important Message  Patient Details  Name: Michael Woods MRN: [id number] Date of Birth: [birth year]   Medicare Important Message Given:  Yes    Siddharth Babington [address] 01/13/2018, 9:40 AM

## 2018-01-13 NOTE — Progress Notes (Signed)
Patient ID: Michael Woods, male   DOB: 1975-12-04, 42 y.o.   MRN: [id number]         [facility]  Date of Admission:  01/11/2018           Day 3 amphotericin        Day 2 flucytosine ASSESSMENT: Michael Woods is tolerating amphotericin and flucytosine well.  I spoke with Carolynn Sayers of [place].  They will be able to start outpatient amphotericin on Sunday, 01/15/2018.  Therefore he will need to stay in the hospital until he receives his dose tomorrow morning.  I told his nurse that she can give it first thing in the morning so he will be able to leave by midday.  PLAN: 1. Continue amphotericin and flucytosine 2. I will sign off now  Diagnosis: Pulmonary cryptococcoma  Culture Result: None available  Allergies  Allergen Reactions  . Clonidine Other (See Comments)    Altered mental status, pt blacks out    OPAT Orders Discharge antibiotics: Per pharmacy protocol liposomal amphotericin  Duration: 2 weeks End Date: 01/25/2018  [facility] Care Per Protocol:  Labs 3 times weekly while on IV antibiotics: _x_ CBC with differential __ BMP _x_ CMP _x_ Magnesium __ CRP __ ESR __ Vancomycin trough  _NA_ Please pull PIC at completion of IV antibiotics __ Please leave PIC in place until doctor has seen patient or been notified  Fax labs to [phone]  Clinic Follow Up Appt: 02/06/2018   Principal Problem:   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 dextrose  10 mL Intravenous Q24H  . dextrose  10 mL Intravenous Q24H  . enoxaparin (LOVENOX) injection  60 mg Subcutaneous Q24H  . flucytosine  25 mg/kg Oral Q6H  . fluticasone  1 spray Each Nare QHS  . irbesartan  150 mg Oral QHS  . pregabalin  150 mg Oral QHS  . sodium chloride  500 mL Intravenous Q24H  . sodium chloride  500 mL Intravenous Q24H  . sodium chloride flush  10-40 mL  Intracatheter Q12H   Continuous Infusions: . amphotericin  B  Liposome (AMBISOME) ADULT IV 500 mg (01/12/18 1822)   PRN Meds:.acetaminophen **OR** acetaminophen, diphenhydrAMINE **OR** diphenhydrAMINE, HYDROcodone-ibuprofen, meperidine (DEMEROL) injection, ondansetron **OR** ondansetron (ZOFRAN) IV, sodium chloride flush   SUBJECTIVE: He is feeling well.  He denies any cough, shortness of breath or headache.  He had some mild, transient back spasms when he received his first dose of amphotericin last night.  They resolve spontaneously.  Review of Systems: Review of Systems  Constitutional: Negative for chills, diaphoresis, fever and weight loss.  Respiratory: Negative for cough, hemoptysis, sputum production and shortness of breath.   Cardiovascular: Negative for chest pain.  Gastrointestinal: Negative for abdominal pain, diarrhea, nausea and vomiting.  Neurological: Negative for headaches.    Allergies  Allergen Reactions  . Clonidine Other (See Comments)    Altered mental status, pt blacks out    OBJECTIVE: Vitals:   01/12/18 1855 01/12/18 2039 01/13/18 0509 01/13/18 0928  BP: 125/87 126/88 108/73 123/86  Pulse: 69 79 64 62  Resp: 18 16 16 14   Temp: 98.2 F (36.8 C) 98.4 F (36.9 C) 97.9 F (36.6 C) (!) 97.4 F (36.3 C)  TempSrc: Oral Oral Oral Oral  SpO2:  97% 97% 100%  Weight: 287 lb (130.2 kg)     Height: 6' 3"  (  1.905 m)      Body mass index is 35.87 kg/m.  Physical Exam  Constitutional: He is oriented to person, place, and time.  He is resting quietly in bed watching a movie on his computer.  Neck: Neck supple.  Cardiovascular: Normal rate, regular rhythm and normal heart sounds.  No murmur heard. Pulmonary/Chest: Effort normal and breath sounds normal.    Neurological: He is alert and oriented to person, place, and time.  Skin: No rash noted.  Psychiatric: He has a normal mood and affect.    Lab Results Lab Results  Component Value Date   WBC 2.6  (L) 01/11/2018   HGB 13.2 01/11/2018   HCT 38.9 (L) 01/11/2018   MCV 98.2 01/11/2018   PLT 197 01/11/2018    Lab Results  Component Value Date   CREATININE 1.03 01/13/2018   BUN 7 01/13/2018   NA 143 01/13/2018   K 3.5 01/13/2018   CL 107 01/13/2018   CO2 26 01/13/2018    Lab Results  Component Value Date   ALT 133 (H) 01/11/2018   AST 78 (H) 01/11/2018   ALKPHOS 58 01/11/2018   BILITOT 0.8 01/11/2018    CXR 01/11/2018  IMPRESSION: Improvement in right apical nodular density, decreased in both size and density, consistent with response to interval therapy. Continued follow-up recommended.    By: Richardean Sale M.D.   Michel Bickers, MD [facility] [phone] pager   [phone] cell 01/13/2018, 10:47 AM

## 2018-01-13 NOTE — Consult Note (Signed)
            [facility] CM Primary Care Navigator  01/13/2018  Michael Woods 09-Feb-1976 [id number]   Went to see patientat the bedsideto identify possible discharge needs. Patientreports having lymphoma, status post chemotherapy-- was admitted after failing 6 weeks of outpatient oral fluconazole for pulmonary cryptococcoma in the right upper lobe per MD note. (Cryptococcal pneumonitis)   Patientendorses Dr.Kirsten Cox with [facility] as his primary care provider.   [facility] [place] [facility].   Patient reportsmanaginghis ownmedications at home straight out of the containers.  Patient statesthathe has been driving prior to admission,buthis wife [name]) or son [name]) can providetransportation to hisdoctors' appointmentsif needed after discharge.  Patient verbalizedbeing independent with self care prior to hospitalization. Wife will serve as the primary caregiver whenever needed after discharge.  Anticipateddischarge planishomewith home health services according to patient.  Patientvoiced understanding to see primary care provider's officewhen hereturnshomefor a post discharge follow-up within1- 2weeksor sooner if needs arise.Patient letter (with PCP's contact number) was provided asareminder.   Discussed with patientregarding [facility] CM services available for health management at home buthe indicated being capable of managing himself so far and not needing any services for now.   He deniescurrentneeds or concernsat this point and had declined EMMI (Pneumonia) calls to follow-upwith recovery.  Patient was encouragedto seekreferral from primary care provider to [facility] care management if deemednecessaryand appropriate for any services in the future.  [facility] care management information was provided for future needs that he may have.    For additional  questions please contact:  Edwena Felty A. Evee Liska, BSN, RN-BC [facility] PRIMARY CARE Navigator Cell: [phone]

## 2018-01-13 NOTE — Care Management Note (Signed)
Case Management Note  Patient Details  Name: Zaxton Angerer MRN: [id number] Date of Birth: 03/09/1976  Subjective/Objective:                    Action/Plan:   Expected Discharge Date:                  Expected Discharge Plan:  [place]  In-House Referral:     Discharge planning Services  CM Consult  Post Acute Care Choice:  Home Health Choice offered to:  Patient  DME Arranged:  N/A DME Agency:  NA  HH Arranged:  RN [place] Agency:  [facility]  Status of Service:  Completed, signed off  If discussed at [place] of Stay Meetings, dates discussed:    Additional Comments:  Marilu Favre, RN 01/13/2018, 8:42 AM

## 2018-01-14 LAB — MAGNESIUM: MAGNESIUM: 1.9 mg/dL (ref 1.7–2.4)

## 2018-01-14 LAB — BASIC METABOLIC PANEL
ANION GAP: 8 (ref 5–15)
BUN: 8 mg/dL (ref 6–20)
CHLORIDE: 107 mmol/L (ref 98–111)
CO2: 27 mmol/L (ref 22–32)
CREATININE: 1.1 mg/dL (ref 0.61–1.24)
Calcium: 8.7 mg/dL — ABNORMAL LOW (ref 8.9–10.3)
GFR calc Af Amer: >60 mL/min (ref >60)
GFR calc non Af Amer: >60 mL/min (ref >60)
Glucose, Bld: 126 mg/dL — ABNORMAL HIGH (ref 70–99)
POTASSIUM: 3.1 mmol/L — AB (ref 3.5–5.1)
SODIUM: 142 mmol/L (ref 135–145)

## 2018-01-14 LAB — CBC
HEMATOCRIT: 37.8 % — AB (ref 39.0–52.0)
HEMOGLOBIN: 12.9 g/dL — AB (ref 13.0–17.0)
MCH: 33.6 pg (ref 26.0–34.0)
MCHC: 34.1 g/dL (ref 30.0–36.0)
MCV: 98.4 fL (ref 78.0–100.0)
Platelets: 186 10*3/uL (ref 150–400)
RBC: 3.84 MIL/uL — ABNORMAL LOW (ref 4.22–5.81)
RDW: 15 % (ref 11.5–15.5)
WBC: 2.7 10*3/uL — ABNORMAL LOW (ref 4.0–10.5)

## 2018-01-14 MED ORDER — AMPHOTERICIN B LIPOSOME 50 MG IV SUSR: 500.0000 mg | INTRAVENOUS | 0 refills | Status: AC

## 2018-01-14 MED ORDER — POTASSIUM CHLORIDE CRYS ER 20 MEQ PO TBCR
40.0000 meq | EXTENDED_RELEASE_TABLET | Freq: Once | ORAL | Status: AC
  Administered 2018-01-14: 40 meq via ORAL
  Filled 2018-01-14: qty 2

## 2018-01-14 MED ORDER — FLUCYTOSINE 250 MG PO CAPS: 25.0000 mg/kg | ORAL_CAPSULE | Freq: Four times a day (QID) | ORAL | 0 refills | Status: DC

## 2018-01-14 MED ORDER — POTASSIUM CHLORIDE CRYS ER 10 MEQ PO TBCR
EXTENDED_RELEASE_TABLET | ORAL | Status: AC
  Filled 2018-01-14: qty 4

## 2018-01-14 MED ORDER — POTASSIUM CHLORIDE CRYS ER 20 MEQ PO TBCR
40.0000 meq | EXTENDED_RELEASE_TABLET | Freq: Once | ORAL | Status: AC
  Administered 2018-01-14: 40 meq via ORAL

## 2018-01-14 MED ORDER — DEXTROSE 5 % IV SOLN: 500.0000 mg | INTRAVENOUS | 0 refills | Status: DC

## 2018-01-14 MED ORDER — DIPHENHYDRAMINE HCL 25 MG PO CAPS: 25.0000 mg | ORAL_CAPSULE | Freq: Every day | ORAL | 0 refills | Status: DC | PRN

## 2018-01-14 MED ORDER — ACETAMINOPHEN 325 MG PO TABS: 650.0000 mg | ORAL_TABLET | Freq: Four times a day (QID) | ORAL | 0 refills | Status: AC | PRN

## 2018-01-14 MED ORDER — HEPARIN SOD (PORK) LOCK FLUSH 100 UNIT/ML IV SOLN
500.0000 [IU] | INTRAVENOUS | Status: AC | PRN
  Administered 2018-01-14: 500 [IU]

## 2018-01-14 NOTE — Discharge Summary (Signed)
Michael Woods, is a 42 y.o. male  DOB December 05, 1975  MRN [id number].  Admission date:  01/11/2018  Admitting Physician  Karmen Bongo, MD  Discharge Date:  01/14/2018   Primary MD  Rochel Brome, MD  Recommendations for primary care physician for things to follow:   1)Okay to leave left sided chest Port-A-Cath accessed so home health agency can use it to infuse  amphotericin  for patient at home  2) CBC and CMP and magnesium on Monday, 01/16/2018-----please send results infectious disease specialist  Dr. 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 Chronic lower back pain    "pulled muscle; have bone spurs" (01/11/2018)  . 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 Follicular lymphoma ([place]) 2011    recurrent/notes 01/11/2018  . GERD (gastroesophageal reflux disease)    "food related" (01/11/2018)  . History of gout   . History of stomach ulcers 1990s  . HTN (hypertension)   . Neuropathy   . Pneumonia    recurrent/notes 01/11/2018  . PONV (postoperative nausea and vomiting)   . Social anxiety disorder     Past Surgical History:  Procedure Laterality Date  . AXILLARY LYMPH NODE BIOPSY Left 2011  . CARPAL TUNNEL RELEASE Right   . KNEE ARTHROSCOPY Bilateral   . LUMBAR PUNCTURE  12/2017    post LP HA and found to have csf leak requiring blood patch/notes 01/02/2018  . PORTA CATH INSERTION Left 2011  . TONSILLECTOMY         HPI  from the history and physical done on the day of admission:    Patient coming from: ID office  Chief Complaint: pulmonary cryptococcal disease  HPI: Michael Woods is a very  pleasant 42 y.o. male with medical history significant for recurrent follicular lymphoma, recurrent pneumonia obesity,hypertension, on echo back pain presents to the fifth floor room 14 from infectious disease or Dr. Graylon Good with infectious disease. [facility] are asked to admit.  Information is obtained from the chart and the patient. Chart review indicates patient started to have recurrent pneumonia found to have right upper lobe infiltrate. He underwent a biopsy and found to have Cryptococcus on path. His SCr AG was 1:512. He was started on fuconazole 400 mg daily in April. He underwent brain MRI had CSF analysis to did not show evidence of CSF cryptococcal meningitis. According to ID note csf cx are regular to date for both aerobic and fungal cx from 6/10. Was seen in the infectious disease clinic last week and that note indicates Cr Ag now higher. ID recommended direct admission to concern for lack of response to oral fluconazole. Patient denies any headache dizziness syncope or near-syncope. He denies cough fever chills shortness  of breath. He denies chest pain palpitations lower extremity edema. He denies any abdominal pain nausea vomiting diarrhea constipation melena bright red blood per rectum. No dysuria hematuria frequency or urgency.    ED Course: upon admission he is afebrile hemodynamically stable and not hypoxic    Hospital Course:   Brief summary:- 42 year old with past medical history relevant for lymphoma status post chemotherapy who was admitted on 01/11/2018 after failing 6 weeks of outpatient oral fluconazole for pulmonary cryptococcoma in the right upper lobe      Plan:- 1)pulmonary cryptococcoma in the right upper lobe--patient is largely asymptomatic from a respiratory standpoint, as per patient her disease specialist "the plan is to give him a 2-week induction course of IV liposomal amphotericin and oral flucytosine.  Patient tolerated infusion of IV  amphotericin well in the hospital from 01/11/18 thru 01/14/2018, further doses  of amphotericin can be administered through his Port-A-Cath at home with [facility] RN .  He is leukopenic and slightly neutropenic from his latest round of chemotherapy for lymphoma. He also has some liver enzyme elevation which may be caused by his recent fluconazole therapy".  Tolerating infusion of amphotericin well at this time, home health agency will not be able to start infusions until Sunday, 01/15/2018 Discharge home on IV amphotericin and p.o. flucytosineas above.. Repeat CMP, magnesium and CBC on 01/16/2018, follow-up with infectious disease specialist Dr. Karolee Ohs,  close follow-up advised  2)H/o Lymphoma--status post chemotherapy with Rituxan and Revlimid, leukopenia and neutropenia noted as well as elevated LFTs... Follow-up with oncologist  3)HTN--stable, continue ARB  Code Status : Full    Disposition Plan  : home  With [facility]  Consults  :  ID  Discharge Condition: stable  Follow UP--- with infectious disease specialist Dr. Graylon Good   Consults obtained - ID  Diet and Activity recommendation:  As advised  Discharge Instructions    Discharge Instructions    Call MD for:  difficulty breathing, headache or visual disturbances   Complete by:  As directed    Call MD for:  persistant dizziness or light-headedness   Complete by:  As directed    Call MD for:  persistant nausea and vomiting   Complete by:  As directed    Call MD for:  redness, tenderness, or signs of infection (pain, swelling, redness, odor or green/yellow discharge around incision site)   Complete by:  As directed    Call MD for:  severe uncontrolled pain   Complete by:  As directed    Call MD for:  temperature >100.4   Complete by:  As directed    Diet general   Complete by:  As directed    Discharge instructions   Complete by:  As directed    1)Okay to leave left sided chest Port-A-Cath accessed so home health agency can  use it to infuse  amphotericin  for patient at home  2) CBC and CMP and magnesium on Monday, 01/16/2018-----please send results infectious disease specialist  Dr. Karolee Ohs  3) follow-up with oncologist regarding management of [place] infusion instructions [place] May follow [place] Dosing Protocol; May administer Cathflo as needed to maintain patency of vascular access device.; Flushing of vascular access device: per [facility] Protocol: 0.9% NaCl pre/post medica...   Complete by:  As directed    Instructions:  May follow [place] Dosing Protocol   Instructions:  May administer Cathflo as needed to maintain patency of vascular access device.   Instructions:  Flushing of vascular access device: per [facility] Protocol: 0.9% NaCl pre/post medication administration and prn patency; Heparin 100 u/ml, 12m for implanted ports and Heparin 10u/ml, 531mfor all other central venous catheters.   Instructions:  May follow [facility] Anaphylaxis Protocol for First Dose Administration in the home: 0.9% NaCl at 25-50 ml/hr to maintain IV access for protocol meds. Epinephrine 0.3 ml IV/IM PRN and Benadryl 25-50 IV/IM PRN s/s of anaphylaxis.   Instructions:  [facility] Coordinator (RN) to assist per patient IV care needs in the home PRN.   Increase activity slowly   Complete by:  As directed         Discharge Medications     Allergies as of 01/14/2018      Reactions   Clonidine Other (See Comments)   Altered mental status, pt blacks out      Medication List    STOP taking these medications   fluconazole 200 MG tablet Commonly known as:  DIFLUCAN     TAKE these medications   acetaminophen 325 MG tablet Commonly known as:  TYLENOL Take 2 tablets (650 mg total) by mouth every 6 (six) hours as needed for mild pain (or Fever >/= 101).   amphotericin B liposome 500 mg in dextrose 5 % 500 mL Inject 500 mg into the vein daily for 12 days. Indication:  Crytococcal PNA Last  Day of Therapy:  01/25/18 Infuse 500 mL of normal saline 1 hour prior to dose  Flush IV line with Dextrose after normal saline infusion before ampho B  Flush IV line with Dextrose after ampho B before second normal saline infusion  Infuse 500 mL of saline directly after ampho B dose  Labs:  - BMET,CBC, MG- q48h  - CRP/ESR- q7dLabs - Once weekly:  CBC/D and BMP, Labs - Every other week:  ESR and CRP   amphotericin B liposome 500 mg in dextrose 5 % 500 mL Inject 500 mg into the vein daily. Start taking on:  01/15/2018   diphenhydrAMINE 25 mg capsule Commonly known as:  BENADRYL Take 1 capsule (25 mg total) by mouth daily as needed for itching or allergies (for itching 60 minutes prior to infusion.).   flucytosine 250 MG capsule Commonly known as:  ANCOBON Take 13 capsules (3,250 mg total) by mouth every 6 (six) hours.   fluticasone 50 MCG/ACT nasal spray Commonly known as:  FLONASE Place 2 sprays into both nostrils daily.   HYDROcodone-ibuprofen 7.5-200 MG tablet Commonly known as:  VICOPROFEN Take 1 tablet by mouth every 8 (eight) hours as needed for moderate pain.   loratadine 10 MG tablet Commonly known as:  CLARITIN Take 10 mg by mouth daily.   LYRICA 150 MG capsule Generic drug:  pregabalin Take 150 mg by mouth 2 (two) times daily.   olmesartan 20 MG tablet Commonly known as:  BENICAR Take 20 mg by mouth daily.   REVLIMID 20 MG capsule Generic drug:  lenalidomide Take 20 mg by mouth See admin instructions. Take one tablet daily for 21 days then hold for 21 days, repeat.            Home Infusion Instuctions  (From admission, onward)        Start     Ordered   01/14/18 0000  Home infusion instructions [facility] May follow [facility] Protocol; May administer Cathflo as needed to maintain patency of vascular access device.; Fl[facility] of vascular access device[facility]: 0.9% NaCl pre/post medica...Marland KitchenMarland Kitchen  Question Answer Comment    Instructions May follow [place] Dosing Protocol   Instructions May administer Cathflo as needed to maintain patency of vascular access device.   Instructions Flushing of vascular access device: per [facility] Protocol: 0.9% NaCl pre/post medication administration and prn patency; Heparin 100 u/ml, 79m for implanted ports and Heparin 10u/ml, 539mfor all other central venous catheters.   Instructions May follow [facility] Anaphylaxis Protocol for First Dose Administration in the home: 0.9% NaCl at 25-50 ml/hr to maintain IV access for protocol meds. Epinephrine 0.3 ml IV/IM PRN and Benadryl 25-50 IV/IM PRN s/s of anaphylaxis.   Instructions [facility] Infusion Coordinator (RN) to assist per patient IV care needs in the home PRN.      01/14/18 1645      Major procedures and Radiology Reports - PLEASE review detailed and final reports for all details, in brief -   Dg Chest 2 View  Result Date: 01/11/2018 CLINICAL DATA:  Follow up right apical lesion. Reported fungal infection on biopsy (per patient). On antibiotic therapy. History of lymphoma. EXAM: CHEST - 2 VIEW COMPARISON:  Radiographs 11/14/2017 and 10/11/2017.  CT 09/30/2017. FINDINGS: Left subclavian Port-A-Cath appears unchanged near the superior cavoatrial junction. The heart size and mediastinal contours are stable. Right apical nodular density has decreased in size and density. No new or enlarging nodules or confluent airspace opacities are present. There is no pleural effusion or pneumothorax. Surgical clips are present in the left axilla. IMPRESSION: Improvement in right apical nodular density, decreased in both size and density, consistent with response to interval therapy. Continued follow-up recommended. Electronically Signed   By: WiRichardean Sale.D.   On: 01/11/2018 16:24   Micro Results   No results found for this or any previous visit (from the past 240 hour(s)).  Today   Subjective    RoDeboraha Sprangoday has no new  complaints, no fever no chills, tolerating infusion of amphotericin well         Patient has been seen and examined prior to discharge   Objective   Blood pressure (!) 138/97, pulse 65, temperature 98.1 F (36.7 C), temperature source Oral, resp. rate 17, height _0  (1.905 m), weight 130.2 kg (287 lb), SpO2 97 %.   Intake/Output Summary (Last 24 hours) at 01/14/2018 1654 Last data filed at 01/14/2018 1300 Gross per 24 hour  Intake 240 ml  Output 0 ml  Net 240 ml    Exam Gen:- Awake Alert,  obese HEENT:- [place].AT, No sclera icterus Neck-Supple Neck,No JVD,.  Lungs-  CTAB , fairly symmetrical air movement CV- S1, S2 normal, left subclavian area PorthAcath site is clean dry and intact Abd-  +ve B.Sounds, Abd Soft, No tenderness,    Extremity/Skin:- No  edema, good pulses Psych-affect is appropriate, oriented x3 Neuro-no new focal deficits, no tremors     Data Review   CBC w Diff:  Lab Results  Component Value Date   WBC 2.7 (L) 01/14/2018   HGB 12.9 (L) 01/14/2018   HCT 37.8 (L) 01/14/2018   PLT 186 01/14/2018   LYMPHOPCT 34 01/11/2018   MONOPCT 26 01/11/2018   EOSPCT 2 01/11/2018   BASOPCT 3 01/11/2018    CMP:  Lab Results  Component Value Date   NA 142 01/14/2018   K 3.1 (L) 01/14/2018   CL 107 01/14/2018   CO2 27 01/14/2018   BUN 8 01/14/2018   CREATININE 1.10 01/14/2018   CREATININE 1.06 11/14/2017   PROT 5.5 (L)  01/11/2018   ALBUMIN 3.8 01/11/2018   BILITOT 0.8 01/11/2018   ALKPHOS 58 01/11/2018   AST 78 (H) 01/11/2018   ALT 133 (H) 01/11/2018  .   Total Discharge time is about 33 minutes  Roxan Hockey M.D on 01/14/2018 at 4:54 PM   Go to [email or web address] - password [email or web address] for contact info  [facility] - Office  [phone]

## 2018-01-14 NOTE — Discharge Instructions (Signed)
1)Okay to leave left sided chest Port-A-Cath accessed so home health agency can use it to infuse  amphotericin  for patient at home  2) CBC and CMP and magnesium on Monday, 01/16/2018-----please send results infectious disease specialist  Dr. Karolee Ohs  3) follow-up with oncologist regarding management of lymphoma

## 2018-01-14 NOTE — Progress Notes (Signed)
Patient discharged per MD. Prescriptions provided for outpatient antibiotics. IV team disconnected PAC from saline, however, PAC left accessed per MD order. Patient declined wheelchair. Bartholomew Crews, RN

## 2018-01-15 ENCOUNTER — Other Ambulatory Visit
Admission: RE | Admit: 2018-01-15 | Discharge: 2018-01-15 | Disposition: A | Discharge status: Home / Self Care | Payer: Medicare Other | Source: Ambulatory Visit | Attending: Internal Medicine | Admitting: Internal Medicine

## 2018-01-15 DIAGNOSIS — E669 Obesity, unspecified: Secondary | ICD-10-CM | POA: Diagnosis not present

## 2018-01-15 DIAGNOSIS — Z87891 Personal history of nicotine dependence: Secondary | ICD-10-CM | POA: Diagnosis not present

## 2018-01-15 DIAGNOSIS — B450 Pulmonary cryptococcosis: Secondary | ICD-10-CM | POA: Insufficient documentation

## 2018-01-15 DIAGNOSIS — Z452 Encounter for adjustment and management of vascular access device: Secondary | ICD-10-CM | POA: Diagnosis not present

## 2018-01-15 DIAGNOSIS — K219 Gastro-esophageal reflux disease without esophagitis: Secondary | ICD-10-CM | POA: Diagnosis not present

## 2018-01-15 DIAGNOSIS — F419 Anxiety disorder, unspecified: Secondary | ICD-10-CM | POA: Diagnosis not present

## 2018-01-15 DIAGNOSIS — G629 Polyneuropathy, unspecified: Secondary | ICD-10-CM | POA: Diagnosis not present

## 2018-01-15 DIAGNOSIS — I10 Essential (primary) hypertension: Secondary | ICD-10-CM | POA: Diagnosis not present

## 2018-01-15 DIAGNOSIS — C8290 Follicular lymphoma, unspecified, unspecified site: Secondary | ICD-10-CM | POA: Diagnosis not present

## 2018-01-15 DIAGNOSIS — Z5181 Encounter for therapeutic drug level monitoring: Secondary | ICD-10-CM | POA: Diagnosis not present

## 2018-01-15 LAB — COMPREHENSIVE METABOLIC PANEL
ALT: 122 U/L — ABNORMAL HIGH (ref 0–44)
ANION GAP: 10 (ref 5–15)
AST: 78 U/L — ABNORMAL HIGH (ref 15–41)
Albumin: 3.5 g/dL (ref 3.5–5.0)
Alkaline Phosphatase: 56 U/L (ref 38–126)
BUN: 16 mg/dL (ref 6–20)
CO2: 26 mmol/L (ref 22–32)
Calcium: 8.7 mg/dL — ABNORMAL LOW (ref 8.9–10.3)
Chloride: 104 mmol/L (ref 98–111)
Creatinine, Ser: 0.98 mg/dL (ref 0.61–1.24)
Glucose, Bld: 148 mg/dL — ABNORMAL HIGH (ref 70–99)
POTASSIUM: 3 mmol/L — AB (ref 3.5–5.1)
Sodium: 140 mmol/L (ref 135–145)
TOTAL PROTEIN: 5.7 g/dL — AB (ref 6.5–8.1)
Total Bilirubin: 0.8 mg/dL (ref 0.3–1.2)

## 2018-01-15 LAB — CBC WITH DIFFERENTIAL/PLATELET
BASOS ABS: 0.1 10*3/uL (ref 0–0.1)
Basophils Relative: 2 %
EOS PCT: 2 %
Eosinophils Absolute: 0 10*3/uL (ref 0–0.7)
HEMATOCRIT: 38.8 % — AB (ref 40.0–52.0)
Hemoglobin: 13.7 g/dL (ref 13.0–18.0)
LYMPHS PCT: 26 %
Lymphs Abs: 0.7 10*3/uL — ABNORMAL LOW (ref 1.0–3.6)
MCH: 35 pg — ABNORMAL HIGH (ref 26.0–34.0)
MCHC: 35.3 g/dL (ref 32.0–36.0)
MCV: 99.2 fL (ref 80.0–100.0)
Monocytes Absolute: 0.3 10*3/uL (ref 0.2–1.0)
Monocytes Relative: 11 %
NEUTROS ABS: 1.7 10*3/uL (ref 1.4–6.5)
NEUTROS PCT: 59 %
PLATELETS: 197 10*3/uL (ref 150–440)
RBC: 3.91 MIL/uL — AB (ref 4.40–5.90)
RDW: 15.9 % — ABNORMAL HIGH (ref 11.5–14.5)
WBC: 2.8 10*3/uL — AB (ref 3.8–10.6)

## 2018-01-15 LAB — MAGNESIUM: Magnesium: 2 mg/dL (ref 1.7–2.4)

## 2018-01-16 ENCOUNTER — Other Ambulatory Visit: Payer: Self-pay | Admitting: Pharmacist Clinician (PhC)/ Clinical Pharmacy Specialist

## 2018-01-16 ENCOUNTER — Telehealth: Payer: Self-pay | Admitting: Pharmacist Clinician (PhC)/ Clinical Pharmacy Specialist

## 2018-01-16 MED ORDER — FLUCYTOSINE 500 MG PO CAPS: 3000.0000 mg | ORAL_CAPSULE | Freq: Four times a day (QID) | ORAL | 0 refills | Status: DC

## 2018-01-16 MED FILL — FLUCYTOSINE 500 MG CAPS: 500 | 10 days supply | Qty: 240 | Fill #0

## 2018-01-16 NOTE — Progress Notes (Signed)
Trying at [facility] because the other pharmcy doesn't have it.

## 2018-01-16 NOTE — Telephone Encounter (Signed)
Got his insurance to override the max coverage for the flucytosine. Non of his pharmacy can get it, however, [facility] pharmacy can. They only have enough for 1 day today but can get the full course tomorrow. He will pick it up tomorrow after Noon. Gave him the address to [facility] pharmacy and number. Counseled him on the new strength of flucytosine. He will be on 6 capsules q6 now instead to finish up on 01/25/18.

## 2018-01-17 DIAGNOSIS — F419 Anxiety disorder, unspecified: Secondary | ICD-10-CM | POA: Diagnosis not present

## 2018-01-17 DIAGNOSIS — B450 Pulmonary cryptococcosis: Secondary | ICD-10-CM | POA: Diagnosis not present

## 2018-01-17 DIAGNOSIS — C8218 Follicular lymphoma grade II, lymph nodes of multiple sites: Secondary | ICD-10-CM | POA: Diagnosis not present

## 2018-01-17 DIAGNOSIS — Z452 Encounter for adjustment and management of vascular access device: Secondary | ICD-10-CM | POA: Diagnosis not present

## 2018-01-17 DIAGNOSIS — C8290 Follicular lymphoma, unspecified, unspecified site: Secondary | ICD-10-CM | POA: Diagnosis not present

## 2018-01-17 DIAGNOSIS — G629 Polyneuropathy, unspecified: Secondary | ICD-10-CM | POA: Diagnosis not present

## 2018-01-17 DIAGNOSIS — I10 Essential (primary) hypertension: Secondary | ICD-10-CM | POA: Diagnosis not present

## 2018-01-17 DIAGNOSIS — K219 Gastro-esophageal reflux disease without esophagitis: Secondary | ICD-10-CM | POA: Diagnosis not present

## 2018-01-19 DIAGNOSIS — I10 Essential (primary) hypertension: Secondary | ICD-10-CM | POA: Diagnosis not present

## 2018-01-19 DIAGNOSIS — C8290 Follicular lymphoma, unspecified, unspecified site: Secondary | ICD-10-CM | POA: Diagnosis not present

## 2018-01-19 DIAGNOSIS — B450 Pulmonary cryptococcosis: Secondary | ICD-10-CM | POA: Diagnosis not present

## 2018-01-19 DIAGNOSIS — F419 Anxiety disorder, unspecified: Secondary | ICD-10-CM | POA: Diagnosis not present

## 2018-01-19 DIAGNOSIS — G629 Polyneuropathy, unspecified: Secondary | ICD-10-CM | POA: Diagnosis not present

## 2018-01-19 DIAGNOSIS — K219 Gastro-esophageal reflux disease without esophagitis: Secondary | ICD-10-CM | POA: Diagnosis not present

## 2018-01-20 ENCOUNTER — Other Ambulatory Visit: Payer: Self-pay | Admitting: Pharmacist

## 2018-01-20 ENCOUNTER — Telehealth: Payer: Self-pay | Admitting: Pharmacist

## 2018-01-20 DIAGNOSIS — C8218 Follicular lymphoma grade II, lymph nodes of multiple sites: Secondary | ICD-10-CM | POA: Diagnosis not present

## 2018-01-20 DIAGNOSIS — Z79899 Other long term (current) drug therapy: Secondary | ICD-10-CM | POA: Diagnosis not present

## 2018-01-20 DIAGNOSIS — B459 Cryptococcosis, unspecified: Secondary | ICD-10-CM | POA: Diagnosis not present

## 2018-01-20 NOTE — Progress Notes (Signed)
[facility] pharmacy lab review

## 2018-01-20 NOTE — Telephone Encounter (Signed)
Patient's SCr slowly rising on amphotericin.  He is due to finish on 7/24.  0.98 (7/14) 1.09 (7/16) 1.61 (7/18)  Will route to Dr. Baxter Flattery.

## 2018-01-22 DIAGNOSIS — C8290 Follicular lymphoma, unspecified, unspecified site: Secondary | ICD-10-CM | POA: Diagnosis not present

## 2018-01-22 DIAGNOSIS — F419 Anxiety disorder, unspecified: Secondary | ICD-10-CM | POA: Diagnosis not present

## 2018-01-22 DIAGNOSIS — I10 Essential (primary) hypertension: Secondary | ICD-10-CM | POA: Diagnosis not present

## 2018-01-22 DIAGNOSIS — G629 Polyneuropathy, unspecified: Secondary | ICD-10-CM | POA: Diagnosis not present

## 2018-01-22 DIAGNOSIS — B450 Pulmonary cryptococcosis: Secondary | ICD-10-CM | POA: Diagnosis not present

## 2018-01-22 DIAGNOSIS — K219 Gastro-esophageal reflux disease without esophagitis: Secondary | ICD-10-CM | POA: Diagnosis not present

## 2018-01-23 DIAGNOSIS — Z452 Encounter for adjustment and management of vascular access device: Secondary | ICD-10-CM | POA: Diagnosis not present

## 2018-01-23 DIAGNOSIS — C8218 Follicular lymphoma grade II, lymph nodes of multiple sites: Secondary | ICD-10-CM | POA: Diagnosis not present

## 2018-01-23 NOTE — Telephone Encounter (Signed)
Can we have him do more fluids - 1L for pre and post infusion for the remaining?

## 2018-01-24 DIAGNOSIS — K219 Gastro-esophageal reflux disease without esophagitis: Secondary | ICD-10-CM | POA: Diagnosis not present

## 2018-01-24 DIAGNOSIS — I10 Essential (primary) hypertension: Secondary | ICD-10-CM | POA: Diagnosis not present

## 2018-01-24 DIAGNOSIS — F419 Anxiety disorder, unspecified: Secondary | ICD-10-CM | POA: Diagnosis not present

## 2018-01-24 DIAGNOSIS — C8290 Follicular lymphoma, unspecified, unspecified site: Secondary | ICD-10-CM | POA: Diagnosis not present

## 2018-01-24 DIAGNOSIS — G629 Polyneuropathy, unspecified: Secondary | ICD-10-CM | POA: Diagnosis not present

## 2018-01-24 DIAGNOSIS — B450 Pulmonary cryptococcosis: Secondary | ICD-10-CM | POA: Diagnosis not present

## 2018-01-24 NOTE — Telephone Encounter (Signed)
Spoke to [place] at [facility]. Patient is getting 500 mL pre and post infusion. Increased to 1L pre and post for the remaining therapy.  Of note, his needle came out over the weekend and he was unable to get it fixed until yesterday at the [facility]. FYI.

## 2018-01-26 ENCOUNTER — Other Ambulatory Visit: Payer: Self-pay | Admitting: Internal Medicine

## 2018-01-26 DIAGNOSIS — F419 Anxiety disorder, unspecified: Secondary | ICD-10-CM | POA: Diagnosis not present

## 2018-01-26 DIAGNOSIS — C8290 Follicular lymphoma, unspecified, unspecified site: Secondary | ICD-10-CM | POA: Diagnosis not present

## 2018-01-26 DIAGNOSIS — G629 Polyneuropathy, unspecified: Secondary | ICD-10-CM | POA: Diagnosis not present

## 2018-01-26 DIAGNOSIS — I10 Essential (primary) hypertension: Secondary | ICD-10-CM | POA: Diagnosis not present

## 2018-01-26 DIAGNOSIS — K219 Gastro-esophageal reflux disease without esophagitis: Secondary | ICD-10-CM | POA: Diagnosis not present

## 2018-01-26 DIAGNOSIS — B450 Pulmonary cryptococcosis: Secondary | ICD-10-CM | POA: Diagnosis not present

## 2018-01-26 MED ORDER — POTASSIUM CHLORIDE ER 10 MEQ PO TBCR: 40.0000 meq | EXTENDED_RELEASE_TABLET | Freq: Every day | ORAL | 0 refills | Status: DC

## 2018-01-26 NOTE — Progress Notes (Signed)
Was called at [place] today by [facility] stating that the patient's potassium was 2.8, have left message for the patient to go to pharmacy to pick up potassium supplementation to start taking 49m eq today and tomorrow. We have pending labs to see if needs further adjustment

## 2018-01-27 DIAGNOSIS — G629 Polyneuropathy, unspecified: Secondary | ICD-10-CM | POA: Diagnosis not present

## 2018-01-27 DIAGNOSIS — C8290 Follicular lymphoma, unspecified, unspecified site: Secondary | ICD-10-CM | POA: Diagnosis not present

## 2018-01-27 DIAGNOSIS — B450 Pulmonary cryptococcosis: Secondary | ICD-10-CM | POA: Diagnosis not present

## 2018-01-27 DIAGNOSIS — I10 Essential (primary) hypertension: Secondary | ICD-10-CM | POA: Diagnosis not present

## 2018-01-27 DIAGNOSIS — K219 Gastro-esophageal reflux disease without esophagitis: Secondary | ICD-10-CM | POA: Diagnosis not present

## 2018-01-27 DIAGNOSIS — F419 Anxiety disorder, unspecified: Secondary | ICD-10-CM | POA: Diagnosis not present

## 2018-01-28 ENCOUNTER — Other Ambulatory Visit: Payer: Self-pay | Admitting: Internal Medicine

## 2018-01-28 ENCOUNTER — Other Ambulatory Visit
Admission: RE | Admit: 2018-01-28 | Discharge: 2018-01-28 | Disposition: A | Discharge status: Home / Self Care | Payer: Medicare Other | Source: Ambulatory Visit | Attending: Internal Medicine | Admitting: Internal Medicine

## 2018-01-28 ENCOUNTER — Telehealth: Payer: Self-pay | Admitting: Internal Medicine

## 2018-01-28 DIAGNOSIS — F419 Anxiety disorder, unspecified: Secondary | ICD-10-CM | POA: Diagnosis not present

## 2018-01-28 DIAGNOSIS — B450 Pulmonary cryptococcosis: Secondary | ICD-10-CM | POA: Diagnosis not present

## 2018-01-28 DIAGNOSIS — C8290 Follicular lymphoma, unspecified, unspecified site: Secondary | ICD-10-CM | POA: Diagnosis not present

## 2018-01-28 DIAGNOSIS — K219 Gastro-esophageal reflux disease without esophagitis: Secondary | ICD-10-CM | POA: Diagnosis not present

## 2018-01-28 DIAGNOSIS — I10 Essential (primary) hypertension: Secondary | ICD-10-CM | POA: Diagnosis not present

## 2018-01-28 DIAGNOSIS — G629 Polyneuropathy, unspecified: Secondary | ICD-10-CM | POA: Diagnosis not present

## 2018-01-28 DIAGNOSIS — E876 Hypokalemia: Secondary | ICD-10-CM

## 2018-01-28 LAB — BASIC METABOLIC PANEL
Anion gap: 14 (ref 5–15)
BUN: 19 mg/dL (ref 6–20)
CHLORIDE: 102 mmol/L (ref 98–111)
CO2: 28 mmol/L (ref 22–32)
Calcium: 9.4 mg/dL (ref 8.9–10.3)
Creatinine, Ser: 1.8 mg/dL — ABNORMAL HIGH (ref 0.61–1.24)
GFR calc Af Amer: 53 mL/min — ABNORMAL LOW (ref >60)
GFR calc non Af Amer: 45 mL/min — ABNORMAL LOW (ref >60)
GLUCOSE: 168 mg/dL — AB (ref 70–99)
POTASSIUM: 2.7 mmol/L — AB (ref 3.5–5.1)
Sodium: 144 mmol/L (ref 135–145)

## 2018-01-28 LAB — MAGNESIUM: Magnesium: 1.8 mg/dL (ref 1.7–2.4)

## 2018-01-28 NOTE — Telephone Encounter (Signed)
Patient has low potassium and low magnesium as a result of taking amphotericin  He was asked to take 70m EQ  Twice during the day, plus received 2gm IV magnesiusm  On repeat still potassium of 2.6. He was asked to take a 3rd dose of 40m EQ by pharmacy in the evening  Repeat BMP to be done on Saturday morning. Awaiting the results.

## 2018-01-29 ENCOUNTER — Other Ambulatory Visit: Payer: Self-pay | Admitting: Internal Medicine

## 2018-01-29 MED ORDER — POTASSIUM CHLORIDE ER 20 MEQ PO TBCR: 20.0000 meq | EXTENDED_RELEASE_TABLET | Freq: Every day | ORAL | 0 refills | Status: DC

## 2018-01-29 NOTE — Progress Notes (Signed)
Patient had repeat labs on 7/27 afternoon with repeat K+ recorded at 2.8. He was instructed to take 68mEQ last night, then TID for 7/28. We will see him on the morning of 7/29 to repeat stat labs and assess if he needs IV supplementationn

## 2018-01-30 ENCOUNTER — Other Ambulatory Visit: Payer: Self-pay | Admitting: *Deleted

## 2018-01-30 ENCOUNTER — Telehealth: Payer: Self-pay | Admitting: Internal Medicine

## 2018-01-30 ENCOUNTER — Other Ambulatory Visit: Payer: Medicare Other

## 2018-01-30 DIAGNOSIS — E876 Hypokalemia: Secondary | ICD-10-CM

## 2018-01-30 DIAGNOSIS — C8218 Follicular lymphoma grade II, lymph nodes of multiple sites: Secondary | ICD-10-CM | POA: Diagnosis not present

## 2018-01-30 LAB — BASIC METABOLIC PANEL
ANION GAP: 15 (ref 5–15)
BUN: 10 mg/dL (ref 6–20)
CALCIUM: 9 mg/dL (ref 8.9–10.3)
CO2: 30 mmol/L (ref 22–32)
CREATININE: 1.53 mg/dL — AB (ref 0.61–1.24)
Chloride: 100 mmol/L (ref 98–111)
GFR calc non Af Amer: 55 mL/min — ABNORMAL LOW (ref >60)
Glucose, Bld: 179 mg/dL — ABNORMAL HIGH (ref 70–99)
POTASSIUM: 3.3 mmol/L — AB (ref 3.5–5.1)
SODIUM: 145 mmol/L (ref 135–145)

## 2018-01-30 LAB — MAGNESIUM: Magnesium: 1.6 mg/dL — ABNORMAL LOW (ref 1.7–2.4)

## 2018-01-30 NOTE — Telephone Encounter (Signed)
Patient had repeat labs today at [facility] which showed potassium of 3.3 plus Mg of 1.6 ( potassium improved since the last 4 days due to oral supplementation)   We have coordinated with head RN at [place] [facility] to give the patient 2gm IV magnesium plus 20MeQ potassium chloride this afternoon.  The patient will take 46 MeQ tonite and tomorrow morning then will resume his usual dosing

## 2018-01-31 DIAGNOSIS — C8299 Follicular lymphoma, unspecified, extranodal and solid organ sites: Secondary | ICD-10-CM | POA: Diagnosis not present

## 2018-01-31 DIAGNOSIS — E876 Hypokalemia: Secondary | ICD-10-CM | POA: Diagnosis not present

## 2018-01-31 DIAGNOSIS — I10 Essential (primary) hypertension: Secondary | ICD-10-CM | POA: Diagnosis not present

## 2018-02-02 DIAGNOSIS — K219 Gastro-esophageal reflux disease without esophagitis: Secondary | ICD-10-CM | POA: Diagnosis not present

## 2018-02-02 DIAGNOSIS — C8290 Follicular lymphoma, unspecified, unspecified site: Secondary | ICD-10-CM | POA: Diagnosis not present

## 2018-02-02 DIAGNOSIS — F419 Anxiety disorder, unspecified: Secondary | ICD-10-CM | POA: Diagnosis not present

## 2018-02-02 DIAGNOSIS — B450 Pulmonary cryptococcosis: Secondary | ICD-10-CM | POA: Diagnosis not present

## 2018-02-02 DIAGNOSIS — G629 Polyneuropathy, unspecified: Secondary | ICD-10-CM | POA: Diagnosis not present

## 2018-02-02 DIAGNOSIS — I10 Essential (primary) hypertension: Secondary | ICD-10-CM | POA: Diagnosis not present

## 2018-02-06 ENCOUNTER — Ambulatory Visit: Payer: Medicare Other | Admitting: Internal Medicine

## 2018-02-10 DIAGNOSIS — R911 Solitary pulmonary nodule: Secondary | ICD-10-CM | POA: Diagnosis not present

## 2018-02-10 DIAGNOSIS — B451 Cerebral cryptococcosis: Secondary | ICD-10-CM | POA: Diagnosis not present

## 2018-02-10 DIAGNOSIS — C8218 Follicular lymphoma grade II, lymph nodes of multiple sites: Secondary | ICD-10-CM | POA: Diagnosis not present

## 2018-02-10 DIAGNOSIS — B450 Pulmonary cryptococcosis: Secondary | ICD-10-CM | POA: Diagnosis not present

## 2018-02-10 DIAGNOSIS — Z79899 Other long term (current) drug therapy: Secondary | ICD-10-CM | POA: Diagnosis not present

## 2018-02-20 ENCOUNTER — Ambulatory Visit (INDEPENDENT_AMBULATORY_CARE_PROVIDER_SITE_OTHER): Payer: Medicare Other | Admitting: Internal Medicine

## 2018-02-20 ENCOUNTER — Encounter: Payer: Self-pay | Admitting: Internal Medicine

## 2018-02-20 VITALS — BP 162/109 | HR 81 | Temp 98.7°F | Ht 75.0 in | Wt 280.0 lb

## 2018-02-20 DIAGNOSIS — R740 Nonspecific elevation of levels of transaminase and lactic acid dehydrogenase [LDH]: Secondary | ICD-10-CM | POA: Diagnosis not present

## 2018-02-20 DIAGNOSIS — C8210 Follicular lymphoma grade II, unspecified site: Secondary | ICD-10-CM

## 2018-02-20 DIAGNOSIS — I10 Essential (primary) hypertension: Secondary | ICD-10-CM

## 2018-02-20 DIAGNOSIS — B450 Pulmonary cryptococcosis: Secondary | ICD-10-CM

## 2018-02-20 DIAGNOSIS — R7401 Elevation of levels of liver transaminase levels: Secondary | ICD-10-CM

## 2018-02-20 MED ORDER — FLUCONAZOLE 200 MG PO TABS: 400.0000 mg | ORAL_TABLET | Freq: Every day | ORAL | 3 refills | Status: DC

## 2018-02-20 NOTE — Progress Notes (Signed)
Patient ID: Michael Woods, male   DOB: Sep 19, 1975, 42 y.o.   MRN: [id number]  HPI 42yo M with pulmonary cryptococcal infection, in setting on follicular lymphoma. S/p iv ampho plus flucytosine induction and oral fluc maintenance. Currently his maintenance chemo is on hold but he is worried and would like it to restart. His Cr Ag had trended upwards while he was on oral treatment for his cryptococcal infection and had received some chemotherapy per patient report going from 1:512 to 1:4092 -thus concern for disseminated disease. Previous to this he had LP that ruled out CNS disease. Due to increase in [place], we did do 2 wk induction with iv ampho and flucytosine which he tolerated but needed aggressive oral repletion of potassium. we had asked his oncologist to hold further treatment until we finish up treatment for pneumonia. On cxr, there is improvement per my read, still evidence but slightly improved from cxr from July.  Outpatient Encounter Medications as of 02/20/2018  Medication Sig  . acetaminophen (TYLENOL) 325 MG tablet Take 2 tablets (650 mg total) by mouth every 6 (six) hours as needed for mild pain (or Fever >/= 101).  . diazepam (VALIUM) 5 MG tablet Take by mouth.  . fluticasone (FLONASE) 50 MCG/ACT nasal spray Place 2 sprays into both nostrils daily.   Marland Kitchen HYDROcodone-ibuprofen (VICOPROFEN) 7.5-200 MG tablet Take 1 tablet by mouth every 8 (eight) hours as needed for moderate pain.  Marland Kitchen loratadine (CLARITIN) 10 MG tablet Take 10 mg by mouth daily.  . methylphenidate (RITALIN) 10 MG tablet   . olmesartan (BENICAR) 40 MG tablet Take 40 mg by mouth daily.   . pregabalin (LYRICA) 150 MG capsule Take 150 mg by mouth 2 (two) times daily.   Marland Kitchen amphotericin B liposome 500 mg in dextrose 5 % 500 mL Inject 500 mg into the vein daily. (Patient not taking: Reported on 02/20/2018)  . diphenhydrAMINE (BENADRYL) 25 mg capsule Take 1 capsule (25 mg total) by mouth daily as needed for itching or  allergies (for itching 60 minutes prior to infusion.). (Patient not taking: Reported on 02/20/2018)  . fluconazole (DIFLUCAN) 200 MG tablet Take 400 mg by mouth daily.  . flucytosine (ANCOBON) 500 MG capsule Take 6 capsules (3,000 mg total) by mouth every 6 (six) hours. (Patient not taking: Reported on 02/20/2018)  . lenalidomide (REVLIMID) 20 MG capsule Take 20 mg by mouth See admin instructions. Take one tablet daily for 21 days then hold for 21 days, repeat.  . Potassium Chloride ER 20 MEQ TBCR Take 20 mEq by mouth daily. (Patient not taking: Reported on 02/20/2018)  . riTUXimab (RITUXAN) 500 MG/50ML injection Inject into the vein.   No facility-administered encounter medications on file as of 02/20/2018.      Patient Active Problem List   Diagnosis Date Noted  . Benign essential hypertension 01/11/2018  . Infection 01/11/2018  . 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 HTN (hypertension)   . Neuropathy   . Follicular lymphoma ([place]) 10/26/2012     Health Maintenance Due  Topic Date Due  . TETANUS/TDAP  04/28/1996  . INFLUENZA VACCINE  02/02/2018     Review of Systems Review of Systems  Constitutional: Negative for fever, chills, diaphoresis, activity change, appetite change, fatigue and unexpected weight change.  HENT: Negative for congestion, sore throat, rhinorrhea, sneezing, trouble swallowing and sinus pressure.  Eyes: Negative for photophobia and visual disturbance.  Respiratory: Negative for cough, chest tightness, shortness of breath, wheezing and stridor.  Cardiovascular: Negative  for chest pain, palpitations and leg swelling.  Gastrointestinal: Negative for nausea, vomiting, abdominal pain, diarrhea, constipation, blood in stool, abdominal distention and anal bleeding.  Genitourinary: Negative for dysuria, hematuria, flank pain and difficulty urinating.  Musculoskeletal: Negative for myalgias, back pain, joint swelling, arthralgias and gait problem.  Skin: Negative  for color change, pallor, rash and wound.  Neurological: Negative for dizziness, tremors, weakness and light-headedness.  Hematological: Negative for adenopathy. Does not bruise/bleed easily.  Psychiatric/Behavioral: Negative for behavioral problems, confusion, sleep disturbance, dysphoric mood, decreased concentration and agitation.    Physical Exam   BP (!) 162/109 Comment: "not sure why - was fine before"  Pulse 81   Temp 98.7 F (37.1 C) (Oral)   Ht 6\' 3"  (1.905 m)   Wt 280 lb (127 kg)   BMI 35.00 kg/m   Physical Exam  Constitutional: He is oriented to person, place, and time. He appears well-developed and well-nourished. No distress.  HENT:  Mouth/Throat: Oropharynx is clear and moist. No oropharyngeal exudate.  Cardiovascular: Normal rate, regular rhythm and normal heart sounds. Exam reveals no gallop and no friction rub.  No murmur heard.  Pulmonary/Chest: Effort normal and breath sounds normal. No respiratory distress. He has no wheezes.  Chest wall = port acath in place no tenderness or erythema Neurological: He is alert and oriented to person, place, and time.  Skin: Skin is warm and dry. No rash noted. No erythema.  Psychiatric: He has a normal mood and affect. His behavior is normal.    CBC Lab Results  Component Value Date   WBC 2.8 (L) 01/15/2018   RBC 3.91 (L) 01/15/2018   HGB 13.7 01/15/2018   HCT 38.8 (L) 01/15/2018   PLT 197 01/15/2018   MCV 99.2 01/15/2018   MCH 35.0 (H) 01/15/2018   MCHC 35.3 01/15/2018   RDW 15.9 (H) 01/15/2018   LYMPHSABS 0.7 (L) 01/15/2018   MONOABS 0.3 01/15/2018   EOSABS 0.0 01/15/2018    BMET Lab Results  Component Value Date   NA 145 01/30/2018   K 3.3 (L) 01/30/2018   CL 100 01/30/2018   CO2 30 01/30/2018   GLUCOSE 179 (H) 01/30/2018   BUN 10 01/30/2018   CREATININE 1.53 (H) 01/30/2018   CALCIUM 9.0 01/30/2018   GFRNONAA 55 (L) 01/30/2018   GFRAA >60 01/30/2018      Assessment and Plan  htn = poorly  controlled at this visit,- but asymptomatic.  his pcp has been managing and changing doses of medications recently. To see them later this week  pulmomonary cryptococcal infection = will have check lft and cr ag  ckd 2 = stable  Addendum: Cr Ag is 1:16. Will repeat in 1 month to see if continues to improve  Mild transaminitis - will recheck CMP in 7-10d to see if steady vs. Increasing to suggest if related to fluconazole

## 2018-02-22 ENCOUNTER — Telehealth: Payer: Self-pay | Admitting: *Deleted

## 2018-02-22 LAB — CRYPTOCOCCAL AG, LTX SCR RFLX TITER
Cryptococcal Ag Screen: DETECTED — CR
Cryptococcal Ag Titer: 1:16 {titer} — AB
MICRO NUMBER:: 90983877
SPECIMEN QUALITY:: ADEQUATE

## 2018-02-22 LAB — COMPLETE METABOLIC PANEL WITH GFR
AG RATIO: 2.6 (calc) — AB (ref 1.0–2.5)
ALT: 108 U/L — ABNORMAL HIGH (ref 9–46)
AST: 65 U/L — AB (ref 10–40)
Albumin: 4.5 g/dL (ref 3.6–5.1)
Alkaline phosphatase (APISO): 71 U/L (ref 40–115)
BILIRUBIN TOTAL: 0.7 mg/dL (ref 0.2–1.2)
BUN: 12 mg/dL (ref 7–25)
CALCIUM: 9.5 mg/dL (ref 8.6–10.3)
CO2: 30 mmol/L (ref 20–32)
Chloride: 103 mmol/L (ref 98–110)
Creat: 1.3 mg/dL (ref 0.60–1.35)
GFR, EST NON AFRICAN AMERICAN: 68 mL/min/{1.73_m2} (ref >=60)
GFR, Est African American: 79 mL/min/{1.73_m2} (ref >=60)
Globulin: 1.7 g/dL (calc) — ABNORMAL LOW (ref 1.9–3.7)
Glucose, Bld: 129 mg/dL — ABNORMAL HIGH (ref 65–99)
POTASSIUM: 3.1 mmol/L — AB (ref 3.5–5.3)
SODIUM: 142 mmol/L (ref 135–146)
Total Protein: 6.2 g/dL (ref 6.1–8.1)

## 2018-02-22 NOTE — Telephone Encounter (Signed)
[facility] called with alert lab result - Cryptococal antigen positive 1:16.  Result not yet available in [facility], they will fax to [phone]. Landis Gandy, RN

## 2018-02-27 NOTE — Telephone Encounter (Signed)
Got it. Already spoke to patient las week.

## 2018-03-01 DIAGNOSIS — B450 Pulmonary cryptococcosis: Secondary | ICD-10-CM | POA: Diagnosis not present

## 2018-03-01 DIAGNOSIS — I10 Essential (primary) hypertension: Secondary | ICD-10-CM | POA: Diagnosis not present

## 2018-03-01 DIAGNOSIS — C8218 Follicular lymphoma grade II, lymph nodes of multiple sites: Secondary | ICD-10-CM | POA: Diagnosis not present

## 2018-03-01 DIAGNOSIS — Z515 Encounter for palliative care: Secondary | ICD-10-CM | POA: Diagnosis not present

## 2018-03-16 DIAGNOSIS — R799 Abnormal finding of blood chemistry, unspecified: Secondary | ICD-10-CM | POA: Diagnosis not present

## 2018-03-16 DIAGNOSIS — R740 Nonspecific elevation of levels of transaminase and lactic acid dehydrogenase [LDH]: Secondary | ICD-10-CM | POA: Diagnosis not present

## 2018-03-16 DIAGNOSIS — I10 Essential (primary) hypertension: Secondary | ICD-10-CM | POA: Diagnosis not present

## 2018-03-19 ENCOUNTER — Telehealth: Payer: Self-pay | Admitting: Internal Medicine

## 2018-03-19 DIAGNOSIS — B450 Pulmonary cryptococcosis: Secondary | ICD-10-CM

## 2018-03-19 NOTE — Telephone Encounter (Signed)
Patient is doing well with taking fluconazole. He has upcoming scan at the end of the month to monitor his lymphoma.   Will push is appt that was originally set up for the morning of the 9/16 but will just get labs instead  Will check cr ag titer plus cmp

## 2018-03-20 ENCOUNTER — Ambulatory Visit: Payer: Medicare Other | Admitting: Internal Medicine

## 2018-03-20 ENCOUNTER — Other Ambulatory Visit: Payer: Self-pay

## 2018-03-30 DIAGNOSIS — C8218 Follicular lymphoma grade II, lymph nodes of multiple sites: Secondary | ICD-10-CM | POA: Diagnosis not present

## 2018-03-30 DIAGNOSIS — I700 Atherosclerosis of aorta: Secondary | ICD-10-CM | POA: Diagnosis not present

## 2018-03-30 DIAGNOSIS — J988 Other specified respiratory disorders: Secondary | ICD-10-CM | POA: Diagnosis not present

## 2018-03-30 DIAGNOSIS — I10 Essential (primary) hypertension: Secondary | ICD-10-CM | POA: Diagnosis not present

## 2018-03-31 DIAGNOSIS — C8218 Follicular lymphoma grade II, lymph nodes of multiple sites: Secondary | ICD-10-CM | POA: Diagnosis not present

## 2018-04-28 DIAGNOSIS — B450 Pulmonary cryptococcosis: Secondary | ICD-10-CM

## 2018-04-28 DIAGNOSIS — C8218 Follicular lymphoma grade II, lymph nodes of multiple sites: Secondary | ICD-10-CM

## 2018-05-25 DIAGNOSIS — I10 Essential (primary) hypertension: Secondary | ICD-10-CM | POA: Diagnosis not present

## 2018-05-25 DIAGNOSIS — Z79891 Long term (current) use of opiate analgesic: Secondary | ICD-10-CM | POA: Diagnosis not present

## 2018-05-25 DIAGNOSIS — K219 Gastro-esophageal reflux disease without esophagitis: Secondary | ICD-10-CM | POA: Diagnosis not present

## 2018-05-25 DIAGNOSIS — Z792 Long term (current) use of antibiotics: Secondary | ICD-10-CM | POA: Diagnosis not present

## 2018-05-25 DIAGNOSIS — R110 Nausea: Secondary | ICD-10-CM | POA: Diagnosis not present

## 2018-05-25 DIAGNOSIS — Z8572 Personal history of non-Hodgkin lymphomas: Secondary | ICD-10-CM | POA: Diagnosis not present

## 2018-05-25 DIAGNOSIS — R531 Weakness: Secondary | ICD-10-CM | POA: Diagnosis not present

## 2018-05-25 DIAGNOSIS — R42 Dizziness and giddiness: Secondary | ICD-10-CM | POA: Diagnosis not present

## 2018-05-25 DIAGNOSIS — Z79899 Other long term (current) drug therapy: Secondary | ICD-10-CM | POA: Diagnosis not present

## 2018-06-29 DIAGNOSIS — C8290 Follicular lymphoma, unspecified, unspecified site: Secondary | ICD-10-CM | POA: Diagnosis not present

## 2018-06-29 DIAGNOSIS — C8218 Follicular lymphoma grade II, lymph nodes of multiple sites: Secondary | ICD-10-CM | POA: Diagnosis not present

## 2018-06-30 ENCOUNTER — Telehealth: Payer: Self-pay | Admitting: *Deleted

## 2018-06-30 DIAGNOSIS — R918 Other nonspecific abnormal finding of lung field: Secondary | ICD-10-CM | POA: Diagnosis not present

## 2018-06-30 DIAGNOSIS — C8218 Follicular lymphoma grade II, lymph nodes of multiple sites: Secondary | ICD-10-CM | POA: Diagnosis not present

## 2018-06-30 DIAGNOSIS — B451 Cerebral cryptococcosis: Secondary | ICD-10-CM | POA: Diagnosis not present

## 2018-06-30 NOTE — Telephone Encounter (Signed)
-----   Message from Carlyle Basques, MD sent at 06/30/2018 11:07 AM EST ----- Can I see him in the next 2-3 wk. Can overbook. For follow up on crypto pna

## 2018-06-30 NOTE — Telephone Encounter (Signed)
Scheduled patient for 1/6 at 11:00 with Dr Baxter Flattery. Left message with appointment information and phone number to call back and confirm/reschedule. Landis Gandy, RN

## 2018-07-10 ENCOUNTER — Encounter: Payer: Self-pay | Admitting: Internal Medicine

## 2018-07-10 ENCOUNTER — Ambulatory Visit (INDEPENDENT_AMBULATORY_CARE_PROVIDER_SITE_OTHER): Payer: Medicare Other | Admitting: Internal Medicine

## 2018-07-10 VITALS — BP 113/79 | HR 92 | Temp 98.4°F | Wt 280.0 lb

## 2018-07-10 DIAGNOSIS — C8210 Follicular lymphoma grade II, unspecified site: Secondary | ICD-10-CM

## 2018-07-10 DIAGNOSIS — R740 Nonspecific elevation of levels of transaminase and lactic acid dehydrogenase [LDH]: Secondary | ICD-10-CM

## 2018-07-10 DIAGNOSIS — R7401 Elevation of levels of liver transaminase levels: Secondary | ICD-10-CM

## 2018-07-10 DIAGNOSIS — B450 Pulmonary cryptococcosis: Secondary | ICD-10-CM

## 2018-07-10 MED ORDER — FLUCONAZOLE 200 MG PO TABS: 400.0000 mg | ORAL_TABLET | Freq: Every day | ORAL | 6 refills | Status: DC

## 2018-07-10 NOTE — Progress Notes (Signed)
Patient ID: Michael Woods, male   DOB: 1975-11-23, 43 y.o.   MRN: [id number]  HPI Pulmonary cryptococcal infection s/p Iv ampho plus now on chronic fluconazole 400mg  daily. Recently had imaging for lymphoma screening and lung lesion is still present but getting smaller  Discussed with dr Bobby Rumpf his oncologist if this is still active cryptococcal infection vs. Lung scarring  Lymphoma is stable. No recurrence at this time.  Patient denies fever, chills, nightsweats. Cough. SOB  Outpatient Encounter Medications as of 07/10/2018  Medication Sig  . acetaminophen (TYLENOL) 325 MG tablet Take 2 tablets (650 mg total) by mouth every 6 (six) hours as needed for mild pain (or Fever >/= 101).  Marland Kitchen amLODipine (NORVASC) 5 MG tablet Take 5 mg by mouth daily.  Marland Kitchen amphotericin B liposome 500 mg in dextrose 5 % 500 mL Inject 500 mg into the vein daily.  . diazepam (VALIUM) 5 MG tablet Take by mouth.  . diphenhydrAMINE (BENADRYL) 25 mg capsule Take 1 capsule (25 mg total) by mouth daily as needed for itching or allergies (for itching 60 minutes prior to infusion.).  Marland Kitchen fluconazole (DIFLUCAN) 200 MG tablet Take 2 tablets (400 mg total) by mouth daily.  . flucytosine (ANCOBON) 500 MG capsule Take 6 capsules (3,000 mg total) by mouth every 6 (six) hours.  . fluticasone (FLONASE) 50 MCG/ACT nasal spray Place 2 sprays into both nostrils daily.   Marland Kitchen HYDROcodone-ibuprofen (VICOPROFEN) 7.5-200 MG tablet Take 1 tablet by mouth every 8 (eight) hours as needed for moderate pain.  Marland Kitchen lenalidomide (REVLIMID) 20 MG capsule Take 20 mg by mouth See admin instructions. Take one tablet daily for 21 days then hold for 21 days, repeat.  . loratadine (CLARITIN) 10 MG tablet Take 10 mg by mouth daily.  . methylphenidate (RITALIN) 10 MG tablet   . olmesartan (BENICAR) 40 MG tablet Take 40 mg by mouth daily.   . Potassium Chloride ER 20 MEQ TBCR Take 20 mEq by mouth daily.  . pregabalin (LYRICA) 150 MG capsule Take 150 mg by  mouth 2 (two) times daily.   . riTUXimab (RITUXAN) 500 MG/50ML injection Inject into the vein.   No facility-administered encounter medications on file as of 07/10/2018.      Patient Active Problem List   Diagnosis Date Noted  . Benign essential hypertension 01/11/2018  . Infection 01/11/2018  . 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 HTN (hypertension)   . Neuropathy   . Follicular lymphoma ([place]) 10/26/2012     Health Maintenance Due  Topic Date Due  . TETANUS/TDAP  04/28/1996  . INFLUENZA VACCINE  02/02/2018    Social History   Tobacco Use  . Smoking status: Former Smoker    Packs/day: 3.00    Years: 12.00    Pack years: 36.00    Types: Cigarettes    Last attempt to quit: 2007    Years since quitting: 13.0  . Smokeless tobacco: Never Used  Substance Use Topics  . Alcohol use: Yes    Alcohol/week: 2.0 standard drinks    Types: 2 Cans of beer per week    Frequency: Never  . Drug use: Not Currently   Review of Systems Review of Systems  Constitutional: Negative for fever, chills, diaphoresis, activity change, appetite change, fatigue and unexpected weight change.  HENT: Negative for congestion, sore throat, rhinorrhea, sneezing, trouble swallowing and sinus pressure.  Eyes: Negative for photophobia and visual disturbance.  Respiratory: Negative for cough, chest tightness, shortness of breath, wheezing and  stridor.  Cardiovascular: Negative for chest pain, palpitations and leg swelling.  Gastrointestinal: Negative for nausea, vomiting, abdominal pain, diarrhea, constipation, blood in stool, abdominal distention and anal bleeding.  Genitourinary: Negative for dysuria, hematuria, flank pain and difficulty urinating.  Musculoskeletal: Negative for myalgias, back pain, joint swelling, arthralgias and gait problem.  Skin: Negative for color change, pallor, rash and wound.  Neurological: Negative for dizziness, tremors, weakness and light-headedness.  Hematological:  Negative for adenopathy. Does not bruise/bleed easily.  Psychiatric/Behavioral: Negative for behavioral problems, confusion, sleep disturbance, dysphoric mood, decreased concentration and agitation.    Physical Exam   BP 113/79   Pulse 92   Temp 98.4 F (36.9 C)   Wt 280 lb (127 kg)   BMI 35.00 kg/m   Physical Exam  Constitutional: He is oriented to person, place, and time. He appears well-developed and well-nourished. No distress.  HENT:  Mouth/Throat: Oropharynx is clear and moist. No oropharyngeal exudate.  Cardiovascular: Normal rate, regular rhythm and normal heart sounds. Exam reveals no gallop and no friction rub.  No murmur heard.  Pulmonary/Chest: Effort normal and breath sounds normal. No respiratory distress. He has no wheezes.  Lymphadenopathy:  He has no cervical adenopathy.  Neurological: He is alert and oriented to person, place, and time.  Skin: Skin is warm and dry. No rash noted. No erythema.  Psychiatric: He has a normal mood and affect. His behavior is normal.    CBC Lab Results  Component Value Date   WBC 2.8 (L) 01/15/2018   RBC 3.91 (L) 01/15/2018   HGB 13.7 01/15/2018   HCT 38.8 (L) 01/15/2018   PLT 197 01/15/2018   MCV 99.2 01/15/2018   MCH 35.0 (H) 01/15/2018   MCHC 35.3 01/15/2018   RDW 15.9 (H) 01/15/2018   LYMPHSABS 0.7 (L) 01/15/2018   MONOABS 0.3 01/15/2018   EOSABS 0.0 01/15/2018    BMET Lab Results  Component Value Date   NA 142 02/20/2018   K 3.1 (L) 02/20/2018   CL 103 02/20/2018   CO2 30 02/20/2018   GLUCOSE 129 (H) 02/20/2018   BUN 12 02/20/2018   CREATININE 1.30 02/20/2018   CALCIUM 9.5 02/20/2018   GFRNONAA 68 02/20/2018   GFRAA 79 02/20/2018    SCrAg = is negative  Assessment and Plan Pulmonary cryptococcal infection = had been on IV ampho then transitioned to oral fluconazole where we had planned to do 9-12 month treatment course through July 2020.  sincer Cr Ag is negative (for the first time) we will have him  finish treatment in 3-4 months which would be the 9 month mark.  transaminitis = mild transaminitis. Stable on repeat CMP today  See back in 3 months

## 2018-07-12 LAB — COMPLETE METABOLIC PANEL WITH GFR
AG Ratio: 2.7 (calc) — ABNORMAL HIGH (ref 1.0–2.5)
ALBUMIN MSPROF: 4.6 g/dL (ref 3.6–5.1)
ALKALINE PHOSPHATASE (APISO): 60 U/L (ref 40–115)
ALT: 137 U/L — AB (ref 9–46)
AST: 73 U/L — AB (ref 10–40)
BUN: 14 mg/dL (ref 7–25)
CHLORIDE: 102 mmol/L (ref 98–110)
CO2: 26 mmol/L (ref 20–32)
CREATININE: 1.29 mg/dL (ref 0.60–1.35)
Calcium: 9.8 mg/dL (ref 8.6–10.3)
GFR, Est African American: 79 mL/min/{1.73_m2} (ref >=60)
GFR, Est Non African American: 68 mL/min/{1.73_m2} (ref >=60)
Globulin: 1.7 g/dL (calc) — ABNORMAL LOW (ref 1.9–3.7)
Glucose, Bld: 101 mg/dL — ABNORMAL HIGH (ref 65–99)
POTASSIUM: 3.9 mmol/L (ref 3.5–5.3)
SODIUM: 138 mmol/L (ref 135–146)
Total Bilirubin: 0.6 mg/dL (ref 0.2–1.2)
Total Protein: 6.3 g/dL (ref 6.1–8.1)

## 2018-07-12 LAB — CRYPTOCOCCAL AG, LTX SCR RFLX TITER
Cryptococcal Ag Screen: NOT DETECTED
MICRO NUMBER: 18786
SPECIMEN QUALITY:: ADEQUATE

## 2018-07-26 DIAGNOSIS — J208 Acute bronchitis due to other specified organisms: Secondary | ICD-10-CM | POA: Diagnosis not present

## 2018-08-01 DIAGNOSIS — C8299 Follicular lymphoma, unspecified, extranodal and solid organ sites: Secondary | ICD-10-CM | POA: Diagnosis not present

## 2018-08-01 DIAGNOSIS — I10 Essential (primary) hypertension: Secondary | ICD-10-CM | POA: Diagnosis not present

## 2018-08-31 DIAGNOSIS — C8218 Follicular lymphoma grade II, lymph nodes of multiple sites: Secondary | ICD-10-CM

## 2018-10-09 NOTE — Progress Notes (Signed)
Name: Michael Woods  DXA:[id number]   DOB: August 24, 1975   PCP: Rochel Brome, MD   Virtual Visit via Telephone Note  I connected with Michael Woods on 10/12/18 at  9:45 AM EDT by telephone and verified that I am speaking with the correct person using two identifiers.   I discussed the limitations, risks, security and privacy concerns of performing an evaluation and management service by telephone and the availability of in person appointments. I also discussed with the patient that there may be a patient responsible charge related to this service. The patient expressed understanding and agreed to proceed.   Chief Complaint  Patient presents with  . Follow-up    crytococcal pneumonitis     History of Present Illness: Michael Woods is a 43 y.o. male with cryptococcal pulmonary infection.  He has a history of follicular lymphoma, currently on Revlimid (3 weeks on then off) plus Rituxan (every 6 weeks).  He has been on this regimen for about 4 years.  Diagnosed originally in 2011.  CT scan of the chest revealed infiltrate versus mass.  Eventually underwent biopsy and found to have cryptococcus on pathology.  Serum cryptococcal antigen titer was 1:512 in May 2019. He underwent brain MRI and lumbar puncture which ruled out CNS involvement.  He had been taking fluconazole 400 mg daily.  Follow-up office visit in July 2019 revealed a higher serum antigen titer (1:4092).  He was admitted through January 11, 2018 ntravenous induction with L- amphotericin and flucytosine due to concern for disseminated disease in the setting of ongoing chemotherapy.  On follow up chest x ray in August 2019 there seemed to be some slight improvement from July's study.   He was last seen by Dr. Baxter Flattery in January 2020 - 6 months into therapy). His lymphoma was at that time stable and his serum cryptococcal antigen had at that point reverted to negative.   In the interval since his last OV [name] reports that  he is feeling well.  He does not have a cough or any chest pain or shortness of breath.  He continues to take his fluconazole once a day without side effects.  He is wondering if he is ready to stop his medication as discussed with Dr. Baxter Flattery a few months ago.  He tells me that at the last telephone discussion with Dr. Baxter Flattery mentioned that he can resume his treatment for lymphoma.  Medical/surgical/social/family history have been updated during today's visit.    Observations/Objective: [name] sounds to be in good health over the phone today.  He has no cough, shortness of breath from what I can tell.  Results for orders placed or performed in visit on 07/10/18 (from the past 5040 hour(s))  COMPLETE METABOLIC PANEL WITH GFR   Collection Time: 07/10/18  1:45 PM  Result Value Ref Range   Glucose, Bld 101 (H) 65 - 99 mg/dL   BUN 14 7 - 25 mg/dL   Creat 1.29 0.60 - 1.35 mg/dL   GFR, Est Non African American 68 > OR = 60 mL/min/1.62m   GFR, Est African American 79 > OR = 60 mL/min/1.754m  BUN/Creatinine Ratio NOT APPLICABLE 6 - 22 (calc)   Sodium 138 135 - 146 mmol/L   Potassium 3.9 3.5 - 5.3 mmol/L   Chloride 102 98 - 110 mmol/L   CO2 26 20 - 32 mmol/L   Calcium 9.8 8.6 - 10.3 mg/dL   Total Protein 6.3 6.1 - 8.1 g/dL  Albumin 4.6 3.6 - 5.1 g/dL   Globulin 1.7 (L) 1.9 - 3.7 g/dL (calc)   AG Ratio 2.7 (H) 1.0 - 2.5 (calc)   Total Bilirubin 0.6 0.2 - 1.2 mg/dL   Alkaline phosphatase (APISO) 60 40 - 115 U/L   AST 73 (H) 10 - 40 U/L   ALT 137 (H) 9 - 46 U/L  Cryptococcal Ag, Ltx Scr Rflx Titer   Collection Time: 07/10/18  1:45 PM  Result Value Ref Range   MICRO NUMBER: [id number]    SPECIMEN QUALITY: Adequate    SOURCE: SERUM    STATUS: FINAL    Cryptococcal Ag Screen Not Detected    COMMENT      Culture should be performed on the initial positive sample in order to recover the causative organism for precise identification (C. neoformans vs. C. gattii) and potential susceptibility  testing.     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  He had a induction with flucytosine and liposomal amphotericin B.  At the last lab visit with Dr. Graylon Good his cryptococcal antigen titer was negative.  In accordance with previous plan we will plan to stop his fluconazole treatment now.  He will return in 1 month to repeat a chest x-ray and check serum cryptococcal antigen again.  We will also make a visit in 3 months with Dr. Baxter Flattery.      Relevant Orders   Cryptococcal antigen   Transaminitis    We will check LFTs at next lab draw.      Relevant Orders   Hepatic function panel       Follow Up Instructions: Return in 1 month for labs and chest x-ray. Return in 3 months for office visit with Dr. Graylon Good.   I discussed the assessment and treatment plan with the patient. The patient was provided an opportunity to ask questions and all were answered. The patient agreed with the plan and demonstrated an understanding of the instructions.   The patient was advised to call back or seek an in-person evaluation if the symptoms worsen or if the condition fails to improve as anticipated.  I provided 15 minutes of non-face-to-face time during this encounter.   Janene Madeira, MSN, NP-C [facility] [email or web address] Pager: [phone] Office: [phone] [place]: [phone]

## 2018-10-10 ENCOUNTER — Ambulatory Visit: Payer: Medicare Other | Admitting: Internal Medicine

## 2018-10-10 ENCOUNTER — Other Ambulatory Visit: Payer: Self-pay

## 2018-10-10 ENCOUNTER — Ambulatory Visit (INDEPENDENT_AMBULATORY_CARE_PROVIDER_SITE_OTHER): Payer: Medicare Other | Admitting: Infectious Diseases

## 2018-10-10 DIAGNOSIS — R740 Nonspecific elevation of levels of transaminase and lactic acid dehydrogenase [LDH]: Secondary | ICD-10-CM

## 2018-10-10 DIAGNOSIS — B450 Pulmonary cryptococcosis: Secondary | ICD-10-CM

## 2018-10-10 DIAGNOSIS — R7401 Elevation of levels of liver transaminase levels: Secondary | ICD-10-CM

## 2018-10-12 ENCOUNTER — Encounter: Payer: Self-pay | Admitting: Infectious Diseases

## 2018-10-12 DIAGNOSIS — R740 Nonspecific elevation of levels of transaminase and lactic acid dehydrogenase [LDH]: Secondary | ICD-10-CM

## 2018-10-12 DIAGNOSIS — R7401 Elevation of levels of liver transaminase levels: Secondary | ICD-10-CM | POA: Insufficient documentation

## 2018-10-12 NOTE — Assessment & Plan Note (Signed)
We will check LFTs at next lab draw.

## 2018-10-12 NOTE — Assessment & Plan Note (Addendum)
Thurman has completed 9 months of antifungal therapy to treat his cryptococcal cavitary pneumonia.  He had a induction with flucytosine and liposomal amphotericin B.  At the last lab visit with Dr. Graylon Good his cryptococcal antigen titer was negative.  In accordance with previous plan we will plan to stop his fluconazole treatment now.  He will return in 1 month to repeat a chest x-ray and check serum cryptococcal antigen again.  We will also make a visit in 3 months with Dr. Baxter Flattery.

## 2018-11-13 ENCOUNTER — Other Ambulatory Visit: Payer: Self-pay

## 2018-11-13 ENCOUNTER — Other Ambulatory Visit: Payer: Medicare Other

## 2018-11-13 DIAGNOSIS — R740 Nonspecific elevation of levels of transaminase and lactic acid dehydrogenase [LDH]: Secondary | ICD-10-CM | POA: Diagnosis not present

## 2018-11-13 DIAGNOSIS — B450 Pulmonary cryptococcosis: Secondary | ICD-10-CM | POA: Diagnosis not present

## 2018-11-13 DIAGNOSIS — R7401 Elevation of levels of liver transaminase levels: Secondary | ICD-10-CM

## 2018-11-16 LAB — HEPATIC FUNCTION PANEL
AG Ratio: 3 (calc) — ABNORMAL HIGH (ref 1.0–2.5)
ALT: 120 U/L — ABNORMAL HIGH (ref 9–46)
AST: 60 U/L — ABNORMAL HIGH (ref 10–40)
Albumin: 4.5 g/dL (ref 3.6–5.1)
Alkaline phosphatase (APISO): 68 U/L (ref 36–130)
Bilirubin, Direct: 0.1 mg/dL (ref 0.0–0.2)
Globulin: 1.5 g/dL (calc) — ABNORMAL LOW (ref 1.9–3.7)
Indirect Bilirubin: 0.5 mg/dL (calc) (ref 0.2–1.2)
Total Bilirubin: 0.6 mg/dL (ref 0.2–1.2)
Total Protein: 6 g/dL — ABNORMAL LOW (ref 6.1–8.1)

## 2018-11-16 LAB — CRYPTOCOCCAL AG, LTX SCR RFLX TITER
Cryptococcal Ag Screen: NOT DETECTED
MICRO NUMBER:: 462401
SPECIMEN QUALITY:: ADEQUATE

## 2018-11-20 ENCOUNTER — Telehealth: Payer: Self-pay | Admitting: Infectious Diseases

## 2018-11-20 NOTE — Telephone Encounter (Signed)
Patient returned Stephanie's call.  RN relayed advice and results, patient verbalized understanding.  He states his liver enzymes are chronically elevated due to his fatty liver disease.  Confirmed PCP, will 'cc lab results. Landis Gandy, RN

## 2018-11-20 NOTE — Telephone Encounter (Signed)
Left non-specific voicemail to patient requesting call back to discuss results of recent tests.   His lab work for his fungal infection are negative in the blood which is wonderful news. Would continue off fluconazole and follow up as scheduled with Dr. Baxter Flattery.   His liver function tests remain elevated - we can forward these to his primary care provider if he wishes for review.

## 2019-01-17 ENCOUNTER — Ambulatory Visit: Payer: Medicare Other | Admitting: Internal Medicine

## 2019-01-29 ENCOUNTER — Ambulatory Visit
Admission: RE | Admit: 2019-01-29 | Discharge: 2019-01-29 | Disposition: A | Discharge status: Home / Self Care | Payer: Medicare Other | Source: Ambulatory Visit | Attending: Internal Medicine | Admitting: Internal Medicine

## 2019-01-29 ENCOUNTER — Ambulatory Visit (INDEPENDENT_AMBULATORY_CARE_PROVIDER_SITE_OTHER): Payer: Medicare Other | Admitting: Internal Medicine

## 2019-01-29 ENCOUNTER — Other Ambulatory Visit: Payer: Self-pay

## 2019-01-29 VITALS — Temp 98.1°F | Ht 75.0 in | Wt 280.0 lb

## 2019-01-29 DIAGNOSIS — B450 Pulmonary cryptococcosis: Secondary | ICD-10-CM

## 2019-01-29 DIAGNOSIS — R918 Other nonspecific abnormal finding of lung field: Secondary | ICD-10-CM | POA: Diagnosis not present

## 2019-01-29 NOTE — Progress Notes (Signed)
Patient ID: Michael Woods, male   DOB: 1975/11/22, 43 y.o.   MRN: [id number]  HPI Michael Woods is a 89GH M with follicular lymphoma found to have cryptococcal pneumonia, cavitary lesions, with high titer concerning for invasive disease, he completed 9 months of antifungal therapy to treat his cryptococcal cavitary pneumonia.  He had a induction with flucytosine and liposomal amphotericin B. He stopped fluconazole in April 2020. He has been doing good from the malignancy standpoint, and has not had to have further treatment at this time but still continues to have surveillance scans  No recent illnesses, no covid contacts  Outpatient Encounter Medications as of 01/29/2019  Medication Sig   acetaminophen (TYLENOL) 325 MG tablet Take 2 tablets (650 mg total) by mouth every 6 (six) hours as needed for mild pain (or Fever >/= 101).   amphotericin B liposome 500 mg in dextrose 5 % 500 mL Inject 500 mg into the vein daily.   diazepam (VALIUM) 5 MG tablet Take by mouth.   diphenhydrAMINE (BENADRYL) 25 mg capsule Take 1 capsule (25 mg total) by mouth daily as needed for itching or allergies (for itching 60 minutes prior to infusion.).   fluticasone (FLONASE) 50 MCG/ACT nasal spray Place 2 sprays into both nostrils daily.    HYDROcodone-ibuprofen (VICOPROFEN) 7.5-200 MG tablet Take 1 tablet by mouth every 8 (eight) hours as needed for moderate pain.   loratadine (CLARITIN) 10 MG tablet Take 10 mg by mouth daily.   olmesartan (BENICAR) 40 MG tablet Take 40 mg by mouth daily.    pregabalin (LYRICA) 150 MG capsule Take 150 mg by mouth 2 (two) times daily.    amLODipine (NORVASC) 5 MG tablet Take 5 mg by mouth daily.   fluconazole (DIFLUCAN) 200 MG tablet Take 2 tablets (400 mg total) by mouth daily. (Patient not taking: Reported on 01/29/2019)   flucytosine (ANCOBON) 500 MG capsule Take 6 capsules (3,000 mg total) by mouth every 6 (six) hours.   lenalidomide (REVLIMID) 20 MG capsule Take 20  mg by mouth See admin instructions. Take one tablet daily for 21 days then hold for 21 days, repeat.   methylphenidate (RITALIN) 10 MG tablet    Potassium Chloride ER 20 MEQ TBCR Take 20 mEq by mouth daily. (Patient not taking: Reported on 01/29/2019)   riTUXimab (RITUXAN) 500 MG/50ML injection Inject into the vein.   No facility-administered encounter medications on file as of 01/29/2019.      Patient Active Problem List   Diagnosis Date Noted   Transaminitis 10/12/2018   Benign essential hypertension 01/11/2018   Infection 01/11/2018   Cryptococcal pneumonitis ([place]) 01/11/2018   HTN (hypertension)    Neuropathy    Follicular lymphoma ([place]) 10/26/2012     Health Maintenance Due  Topic Date Due   TETANUS/TDAP  04/28/1996     Review of Systems Review of Systems  Constitutional: Negative for fever, chills, diaphoresis, activity change, appetite change, fatigue and unexpected weight change.  HENT: Negative for congestion, sore throat, rhinorrhea, sneezing, trouble swallowing and sinus pressure.  Eyes: Negative for photophobia and visual disturbance.  Respiratory: Negative for cough, chest tightness, shortness of breath, wheezing and stridor.  Cardiovascular: Negative for chest pain, palpitations and leg swelling.  Gastrointestinal: Negative for nausea, vomiting, abdominal pain, diarrhea, constipation, blood in stool, abdominal distention and anal bleeding.  Genitourinary: Negative for dysuria, hematuria, flank pain and difficulty urinating.  Musculoskeletal: Negative for myalgias, back pain, joint swelling, arthralgias and gait problem.  Skin: Negative for color  change, pallor, rash and wound.  Neurological: Negative for dizziness, tremors, weakness and light-headedness.  Hematological: Negative for adenopathy. Does not bruise/bleed easily.  Psychiatric/Behavioral: Negative for behavioral problems, confusion, sleep disturbance, dysphoric mood, decreased concentration and  agitation.    Physical Exam   Temp 98.1 F (36.7 C) (Oral)    Ht 6' 3"  (1.905 m)    Wt 280 lb (127 kg)    BMI 35.00 kg/m    Physical Exam  Constitutional: He is oriented to person, place, and time. He appears well-developed and well-nourished. No distress.  HENT:  Mouth/Throat: Oropharynx is clear and moist. No oropharyngeal exudate.  Cardiovascular: Normal rate, regular rhythm and normal heart sounds. Exam reveals no gallop and no friction rub.  No murmur heard.  Pulmonary/Chest: Effort normal and breath sounds normal. No respiratory distress. He has no wheezes.  Skin: Skin is warm and dry. No rash noted. No erythema.  Psychiatric: He has a normal mood and affect. His behavior is normal.    CBC Lab Results  Component Value Date   WBC 2.8 (L) 01/15/2018   RBC 3.91 (L) 01/15/2018   HGB 13.7 01/15/2018   HCT 38.8 (L) 01/15/2018   PLT 197 01/15/2018   MCV 99.2 01/15/2018   MCH 35.0 (H) 01/15/2018   MCHC 35.3 01/15/2018   RDW 15.9 (H) 01/15/2018   LYMPHSABS 0.7 (L) 01/15/2018   MONOABS 0.3 01/15/2018   EOSABS 0.0 01/15/2018    BMET Lab Results  Component Value Date   NA 138 07/10/2018   K 3.9 07/10/2018   CL 102 07/10/2018   CO2 26 07/10/2018   GLUCOSE 101 (H) 07/10/2018   BUN 14 07/10/2018   CREATININE 1.29 07/10/2018   CALCIUM 9.8 07/10/2018   GFRNONAA 68 07/10/2018   GFRAA 79 07/10/2018      Assessment and Plan  Cryptococcal lung disease Will check serum cr ag to ensure still back to baseline of 1:4 from previous studies done through his oncologist  Will check CXR to see that lesion is improved  No need for further treatment at this time  If he is to receive further chemotherapy should his lymphoma relapse, he may benefit from antifungal prophylaxis. Would recommend to have him be seen back in ID clinic if that should occur  To return if needed

## 2019-01-30 DIAGNOSIS — C8299 Follicular lymphoma, unspecified, extranodal and solid organ sites: Secondary | ICD-10-CM | POA: Diagnosis not present

## 2019-01-30 DIAGNOSIS — I10 Essential (primary) hypertension: Secondary | ICD-10-CM | POA: Diagnosis not present

## 2019-01-30 DIAGNOSIS — R5383 Other fatigue: Secondary | ICD-10-CM | POA: Diagnosis not present

## 2019-01-31 ENCOUNTER — Telehealth: Payer: Self-pay | Admitting: *Deleted

## 2019-01-31 LAB — CRYPTOCOCCAL AG, LTX SCR RFLX TITER
Cryptococcal Ag Screen: DETECTED — CR
Cryptococcal Ag Titer: 1:4 {titer} — AB
MICRO NUMBER:: 709043
SPECIMEN QUALITY:: ADEQUATE

## 2019-01-31 NOTE — Telephone Encounter (Signed)
[facility] called to report the lab results from 01/29/19 Cryptococcal Ag, Latex it was detected and the titer was 1:4. Paged Baxter Flattery and waiting on response to inform her of results.

## 2019-02-19 DIAGNOSIS — R918 Other nonspecific abnormal finding of lung field: Secondary | ICD-10-CM | POA: Diagnosis not present

## 2019-02-19 DIAGNOSIS — B451 Cerebral cryptococcosis: Secondary | ICD-10-CM | POA: Diagnosis not present

## 2019-02-19 DIAGNOSIS — C8218 Follicular lymphoma grade II, lymph nodes of multiple sites: Secondary | ICD-10-CM | POA: Diagnosis not present

## 2019-02-19 DIAGNOSIS — C8590 Non-Hodgkin lymphoma, unspecified, unspecified site: Secondary | ICD-10-CM | POA: Diagnosis not present

## 2019-02-20 DIAGNOSIS — D702 Other drug-induced agranulocytosis: Secondary | ICD-10-CM | POA: Diagnosis not present

## 2019-02-20 DIAGNOSIS — C8218 Follicular lymphoma grade II, lymph nodes of multiple sites: Secondary | ICD-10-CM | POA: Diagnosis not present

## 2019-02-20 DIAGNOSIS — B451 Cerebral cryptococcosis: Secondary | ICD-10-CM | POA: Diagnosis not present

## 2019-06-22 DIAGNOSIS — C8218 Follicular lymphoma grade II, lymph nodes of multiple sites: Secondary | ICD-10-CM | POA: Diagnosis not present

## 2019-08-02 DIAGNOSIS — I10 Essential (primary) hypertension: Secondary | ICD-10-CM | POA: Diagnosis not present

## 2019-08-02 DIAGNOSIS — C8299 Follicular lymphoma, unspecified, extranodal and solid organ sites: Secondary | ICD-10-CM | POA: Diagnosis not present

## 2019-10-22 DIAGNOSIS — C8590 Non-Hodgkin lymphoma, unspecified, unspecified site: Secondary | ICD-10-CM | POA: Diagnosis not present

## 2019-10-22 DIAGNOSIS — C8218 Follicular lymphoma grade II, lymph nodes of multiple sites: Secondary | ICD-10-CM | POA: Diagnosis not present

## 2019-10-23 DIAGNOSIS — C8218 Follicular lymphoma grade II, lymph nodes of multiple sites: Secondary | ICD-10-CM | POA: Diagnosis not present

## 2019-12-13 DIAGNOSIS — H9201 Otalgia, right ear: Secondary | ICD-10-CM | POA: Diagnosis not present

## 2019-12-13 DIAGNOSIS — H6991 Unspecified Eustachian tube disorder, right ear: Secondary | ICD-10-CM | POA: Diagnosis not present

## 2020-01-30 ENCOUNTER — Ambulatory Visit (INDEPENDENT_AMBULATORY_CARE_PROVIDER_SITE_OTHER): Payer: Medicare Other | Admitting: Physician Assistant

## 2020-01-30 ENCOUNTER — Other Ambulatory Visit: Payer: Self-pay

## 2020-01-30 ENCOUNTER — Encounter: Payer: Self-pay | Admitting: Physician Assistant

## 2020-01-30 VITALS — BP 100/84 | HR 96 | Temp 97.7°F | Wt 296.0 lb

## 2020-01-30 DIAGNOSIS — I10 Essential (primary) hypertension: Secondary | ICD-10-CM | POA: Diagnosis not present

## 2020-01-30 DIAGNOSIS — M109 Gout, unspecified: Secondary | ICD-10-CM | POA: Diagnosis not present

## 2020-01-30 DIAGNOSIS — C8290 Follicular lymphoma, unspecified, unspecified site: Secondary | ICD-10-CM

## 2020-01-30 NOTE — Assessment & Plan Note (Signed)
Continue ibuprofen as needed Uric acid level pending

## 2020-01-30 NOTE — Assessment & Plan Note (Signed)
Continue follow up with Dr Bobby Rumpf

## 2020-01-30 NOTE — Assessment & Plan Note (Signed)
Well controlled.  ?No changes to medicines.  ?Continue to work on eating a healthy diet and exercise.  ?Labs drawn today.  ?

## 2020-01-30 NOTE — Progress Notes (Signed)
Established Patient Office Visit  Subjective:  Patient ID: Michael Woods, male    DOB: May 28, 1976  Age: 44 y.o. MRN: [id number]  CC:  Chief Complaint  Patient presents with   Hypertension    Fasting follow up    HPI Michael Woods presents for follow up hypertension  Pt presents for follow up of hypertension.  He is tolerating the medication well without side effects.  Compliance with treatment has been good; he takes his medication as directed, maintains his diet and exercise regimen, and follows up as directed.  Currently taking benicar 40/25 qd    Follow up of follicular lymphoma, unspecified, extranodal and solid organ sites.  pt states his disease state at this time is stable -he sees Dr Bobby Rumpf now every 6 months and next appt in October  Pt states he had a flare of gout recently but now improving and almost gone - is taking ibuprofen as needed   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[place])    Follicular lymphoma ([place]) 2011    recurrent/notes 01/11/2018   GERD (gastroesophageal reflux disease)    "food related" (01/11/2018)   History of gout    History of stomach ulcers 1990s   HTN (hypertension)    Neuropathy    Pneumonia    recurrent/notes 01/11/2018   PONV (postoperative nausea and vomiting)    Social anxiety disorder     Past Surgical History:  Procedure Laterality Date   AXILLARY LYMPH NODE BIOPSY Left 2011   CARPAL TUNNEL RELEASE Right    KNEE ARTHROSCOPY Bilateral    LUMBAR PUNCTURE  12/2017    post LP HA and found to have csf leak requiring blood patch/notes 01/02/2018   PORTA CATH INSERTION Left 2011   TONSILLECTOMY      Family History  Problem Relation Age of Onset   Hypertension Mother    Leukemia Mother    Diabetes Mellitus II Mother    Leukemia Father    Diabetes type II Father     Social History    Socioeconomic History   Marital status: Married    Spouse name: Not on file   Number of children: 1   Years of education: Not on file   Highest education level: Not on file  Occupational History   Not on file  Tobacco Use   Smoking status: Former Smoker    Packs/day: 3.00    Years: 12.00    Pack years: 36.00    Types: Cigarettes    Quit date: 2007    Years since quitting: 14.5   Smokeless tobacco: Never Used  Scientific laboratory technician Use: Former  Substance and Sexual Activity   Alcohol use: Yes    Alcohol/week: 2.0 standard drinks    Types: 2 Cans of beer per week   Drug use: Not Currently   Sexual activity: Yes  Other Topics Concern   Not on file  Social History Narrative   Not on file   Social Determinants of Health   Financial Resource Strain:    Difficulty of Paying Living Expenses:   Food Insecurity:    Worried About Charity fundraiser in the Last Year:    Arboriculturist in the Last Year:   Transportation Needs:    Film/video editor (Medical):    Lack of Transportation (Non-Medical):   Physical Activity:  Days of Exercise per Week:    Minutes of Exercise per Session:   Stress:    Feeling of Stress :   Social Connections:    Frequency of Communication with Friends and Family:    Frequency of Social Gatherings with Friends and Family:    Attends Religious Services:    Active Member of Clubs or Organizations:    Attends Music therapist:    Marital Status:   Intimate Partner Violence:    Fear of Current or Ex-Partner:    Emotionally Abused:    Physically Abused:    Sexually Abused:      Current Outpatient Medications:    acetaminophen (TYLENOL) 325 MG tablet, Take 2 tablets (650 mg total) by mouth every 6 (six) hours as needed for mild pain (or Fever >/= 101)., Disp: 12 tablet, Rfl: 0   diphenhydrAMINE (BENADRYL) 25 mg capsule, Take 1 capsule (25 mg total) by mouth daily as needed for itching or  allergies (for itching 60 minutes prior to infusion.)., Disp: 15 capsule, Rfl: 0   fluticasone (FLONASE) 50 MCG/ACT nasal spray, Place 2 sprays into both nostrils daily. , Disp: , Rfl:    loratadine (CLARITIN) 10 MG tablet, Take 10 mg by mouth daily., Disp: , Rfl:    olmesartan-hydrochlorothiazide (BENICAR HCT) 40-25 MG tablet, , Disp: , Rfl:    pregabalin (LYRICA) 150 MG capsule, Take 150 mg by mouth 2 (two) times daily. , Disp: , Rfl:    VENTOLIN HFA 108 (90 Base) MCG/ACT inhaler, , Disp: , Rfl:    Allergies  Allergen Reactions   Clonidine Other (See Comments)    Altered mental status, pt blacks out    ROS CONSTITUTIONAL: Negative for chills, fatigue, fever, unintentional weight gain and unintentional weight loss.   CARDIOVASCULAR: Negative for chest pain, dizziness, palpitations and pedal edema.  RESPIRATORY: Negative for recent cough and dyspnea.  GASTROINTESTINAL: Negative for abdominal pain, acid reflux symptoms, constipation, diarrhea, nausea and vomiting.  MSK: see HPI  PSYCHIATRIC: Negative for sleep disturbance and to question depression screen.  Negative for depression, negative for anhedonia.        Objective:    PHYSICAL EXAM:   VS: BP 100/84 (BP Location: Left Arm, Patient Position: Sitting)    Pulse 96    Temp 97.7 F (36.5 C) (Temporal)    Wt (!) 296 lb (134.3 kg)    SpO2 98%    BMI 37.00 kg/m   GEN: Well nourished, well developed, in no acute distress  Cardiac: RRR; no murmurs, rubs, or gallops,no edema -  Respiratory:  normal respiratory rate and pattern with no distress - normal breath sounds with no rales, rhonchi, wheezes or rubs MS: no deformity or atrophy  Skin: warm and dry, no rash    BP 100/84 (BP Location: Left Arm, Patient Position: Sitting)    Pulse 96    Temp 97.7 F (36.5 C) (Temporal)    Wt (!) 296 lb (134.3 kg)    SpO2 98%    BMI 37.00 kg/m  Wt Readings from Last 3 Encounters:  01/30/20 (!) 296 lb (134.3 kg)  01/29/19 280 lb (127  kg)  07/10/18 280 lb (127 kg)     Health Maintenance Due  Topic Date Due   TETANUS/TDAP  Never done    There are no preventive care reminders to display for this patient.  No results found for: TSH Lab Results  Component Value Date   WBC 2.8 (L) 01/15/2018   HGB 13.7  01/15/2018   HCT 38.8 (L) 01/15/2018   MCV 99.2 01/15/2018   PLT 197 01/15/2018   Lab Results  Component Value Date   NA 138 07/10/2018   K 3.9 07/10/2018   CO2 26 07/10/2018   GLUCOSE 101 (H) 07/10/2018   BUN 14 07/10/2018   CREATININE 1.29 07/10/2018   BILITOT 0.6 11/13/2018   ALKPHOS 56 01/15/2018   AST 60 (H) 11/13/2018   ALT 120 (H) 11/13/2018   PROT 6.0 (L) 11/13/2018   ALBUMIN 3.5 01/15/2018   CALCIUM 9.8 07/10/2018   ANIONGAP 15 01/30/2018   No results found for: CHOL No results found for: HDL No results found for: LDLCALC No results found for: TRIG No results found for: CHOLHDL No results found for: HGBA1C    Assessment & Plan:   Problem List Items Addressed This Visit      Cardiovascular and Mediastinum   HTN (hypertension) - Primary    Well controlled.  No changes to medicines.  Continue to work on eating a healthy diet and exercise.  Labs drawn today.        Relevant Medications   olmesartan-hydrochlorothiazide (BENICAR HCT) 40-25 MG tablet   Other Relevant Orders   CBC with Differential/Platelet   Comprehensive metabolic panel   Lipid panel     Musculoskeletal and Integument   Acute gout involving toe of left foot    Continue ibuprofen as needed Uric acid level pending      Relevant Orders   Uric Acid     Other   Follicular lymphoma ([place])    Continue follow up with Dr Bobby Rumpf      Relevant Orders   CBC with Differential/Platelet      No orders of the defined types were placed in this encounter.   Follow-up: Return in about 6 months (around 08/01/2020) for chronic fasting follow up.    SARA R Stephan Nelis, PA-C

## 2020-01-31 ENCOUNTER — Other Ambulatory Visit: Payer: Self-pay | Admitting: Physician Assistant

## 2020-01-31 LAB — COMPREHENSIVE METABOLIC PANEL
ALT: 86 IU/L — ABNORMAL HIGH (ref 0–44)
AST: 48 IU/L — ABNORMAL HIGH (ref 0–40)
Albumin/Globulin Ratio: 3.1 — ABNORMAL HIGH (ref 1.2–2.2)
Albumin: 4.7 g/dL (ref 4.0–5.0)
Alkaline Phosphatase: 73 IU/L (ref 48–121)
BUN/Creatinine Ratio: 12 (ref 9–20)
BUN: 17 mg/dL (ref 6–24)
Bilirubin Total: 0.8 mg/dL (ref 0.0–1.2)
CO2: 26 mmol/L (ref 20–29)
Calcium: 9.7 mg/dL (ref 8.7–10.2)
Chloride: 98 mmol/L (ref 96–106)
Creatinine, Ser: 1.46 mg/dL — ABNORMAL HIGH (ref 0.76–1.27)
GFR calc Af Amer: 68 mL/min/{1.73_m2} (ref >59)
GFR calc non Af Amer: 58 mL/min/{1.73_m2} — ABNORMAL LOW (ref >59)
Globulin, Total: 1.5 g/dL (ref 1.5–4.5)
Glucose: 104 mg/dL — ABNORMAL HIGH (ref 65–99)
Potassium: 4.4 mmol/L (ref 3.5–5.2)
Sodium: 139 mmol/L (ref 134–144)
Total Protein: 6.2 g/dL (ref 6.0–8.5)

## 2020-01-31 LAB — CBC WITH DIFFERENTIAL/PLATELET
Basophils Absolute: 0.1 10*3/uL (ref 0.0–0.2)
Basos: 1 %
EOS (ABSOLUTE): 0.1 10*3/uL (ref 0.0–0.4)
Eos: 2 %
Hematocrit: 42 % (ref 37.5–51.0)
Hemoglobin: 15.1 g/dL (ref 13.0–17.7)
Immature Grans (Abs): 0 10*3/uL (ref 0.0–0.1)
Immature Granulocytes: 0 %
Lymphocytes Absolute: 1.6 10*3/uL (ref 0.7–3.1)
Lymphs: 32 %
MCH: 32.8 pg (ref 26.6–33.0)
MCHC: 36 g/dL — ABNORMAL HIGH (ref 31.5–35.7)
MCV: 91 fL (ref 79–97)
Monocytes Absolute: 0.6 10*3/uL (ref 0.1–0.9)
Monocytes: 11 %
Neutrophils Absolute: 2.6 10*3/uL (ref 1.4–7.0)
Neutrophils: 54 %
Platelets: 238 10*3/uL (ref 150–450)
RBC: 4.61 x10E6/uL (ref 4.14–5.80)
RDW: 14.3 % (ref 11.6–15.4)
WBC: 4.9 10*3/uL (ref 3.4–10.8)

## 2020-01-31 LAB — URIC ACID: Uric Acid: 10.7 mg/dL — ABNORMAL HIGH (ref 3.8–8.4)

## 2020-01-31 LAB — LIPID PANEL
Chol/HDL Ratio: 6.7 ratio — ABNORMAL HIGH (ref 0.0–5.0)
Cholesterol, Total: 208 mg/dL — ABNORMAL HIGH (ref 100–199)
HDL: 31 mg/dL — ABNORMAL LOW (ref >39)
LDL Chol Calc (NIH): 115 mg/dL — ABNORMAL HIGH (ref 0–99)
Triglycerides: 357 mg/dL — ABNORMAL HIGH (ref 0–149)
VLDL Cholesterol Cal: 62 mg/dL — ABNORMAL HIGH (ref 5–40)

## 2020-01-31 LAB — CARDIOVASCULAR RISK ASSESSMENT

## 2020-01-31 MED ORDER — ALLOPURINOL 100 MG PO TABS
100.0000 mg | ORAL_TABLET | Freq: Every day | ORAL | 2 refills | Status: DC
Start: 2020-01-31 — End: 2022-02-25

## 2020-02-12 ENCOUNTER — Other Ambulatory Visit: Payer: Self-pay | Admitting: Physician Assistant

## 2020-04-01 DIAGNOSIS — Z23 Encounter for immunization: Secondary | ICD-10-CM | POA: Diagnosis not present

## 2020-04-01 DIAGNOSIS — S91332A Puncture wound without foreign body, left foot, initial encounter: Secondary | ICD-10-CM | POA: Diagnosis not present

## 2020-04-09 IMAGING — CR DG CHEST 2V
2 series · 2 of 2 positions shown · non-contrast
Comparison: Radiographs 11/14/2017 and 10/11/2017.  CT 09/30/2017.

CLINICAL DATA: Follow up right apical lesion. Reported fungal
infection on biopsy (per patient). On antibiotic therapy. History of
lymphoma.

EXAM:
CHEST - 2 VIEW

[chest pa]
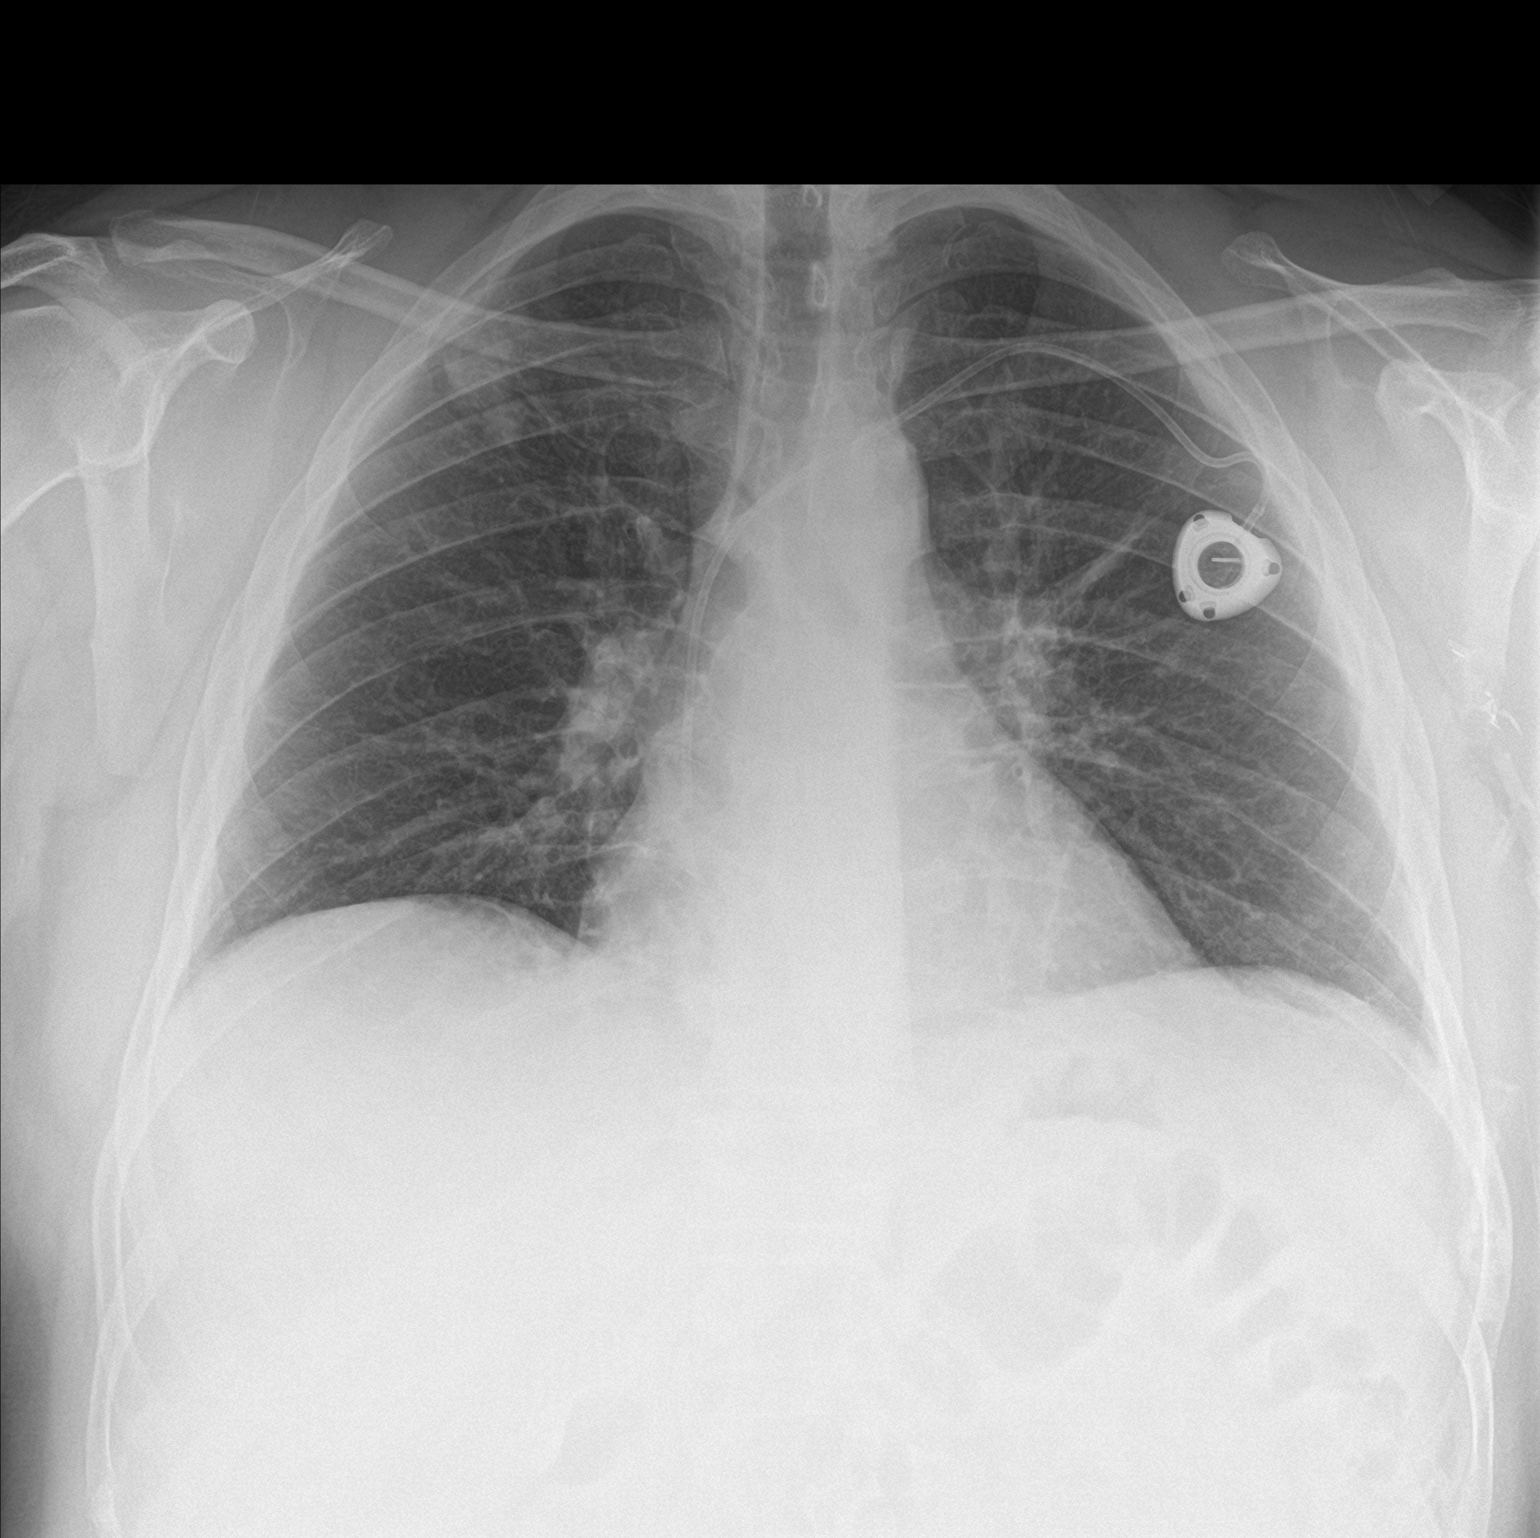

[chest lat]
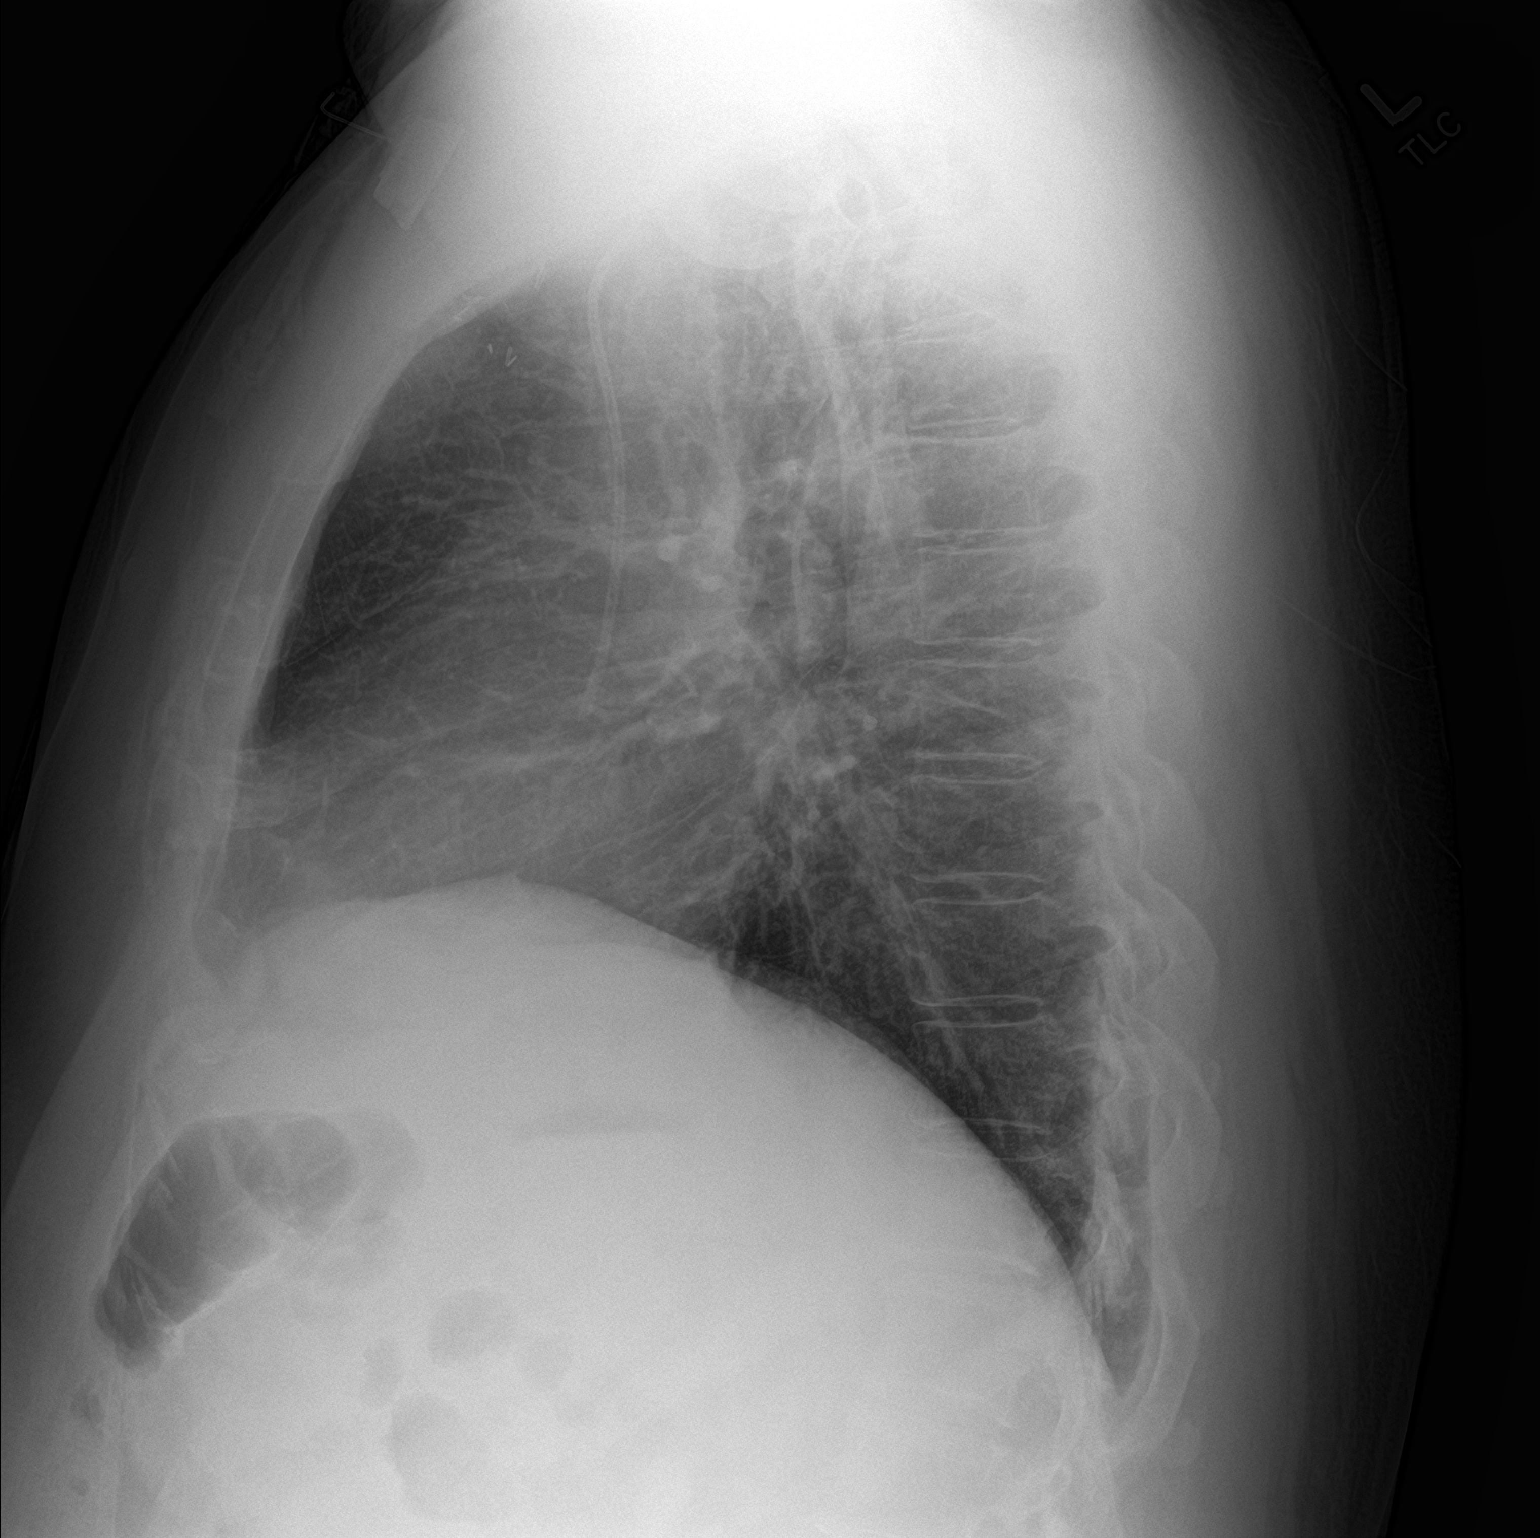

[2 of 2 positions shown; findings below may reference images not displayed]

FINDINGS: Left subclavian Port-A-Cath appears unchanged near the superior
cavoatrial junction. The heart size and mediastinal contours are
stable. Right apical nodular density has decreased in size and
density. No new or enlarging nodules or confluent airspace opacities
are present. There is no pleural effusion or pneumothorax. Surgical
clips are present in the left axilla.
IMPRESSION: Improvement in right apical nodular density, decreased in both size
and density, consistent with response to interval therapy. Continued
follow-up recommended.

## 2020-04-29 DIAGNOSIS — C8218 Follicular lymphoma grade II, lymph nodes of multiple sites: Secondary | ICD-10-CM | POA: Diagnosis not present

## 2020-07-05 DIAGNOSIS — E119 Type 2 diabetes mellitus without complications: Secondary | ICD-10-CM

## 2020-07-05 HISTORY — DX: Type 2 diabetes mellitus without complications: E11.9

## 2020-07-07 DIAGNOSIS — Z1152 Encounter for screening for COVID-19: Secondary | ICD-10-CM | POA: Diagnosis not present

## 2020-07-07 DIAGNOSIS — J019 Acute sinusitis, unspecified: Secondary | ICD-10-CM | POA: Diagnosis not present

## 2020-07-31 NOTE — Progress Notes (Signed)
Subjective:  Patient ID: Michael Woods, male    DOB: [birth year]  Age: 45 y.o. MRN: [id number]  Chief Complaint  Patient presents with  . Hypertension    HPI Hypertension: well controlled on benicar/hctz 40/25 mg once daily Follicular Lymphoma: completed chemo 2 years ago. Follows up annually. Has chemo induced neuropathy: on lyrica and tylenol or ibuprofen. Had cryptococcal pneumonitis in 2019.  Gout: Took allopurinol prn instead of scheduled. He did not understand to take it daily. Takes Ibuprofen - does not help much, but also took allopurinol at time of flare.  Current Outpatient Medications on File Prior to Visit  Medication Sig Dispense Refill  . acetaminophen (TYLENOL) 325 MG tablet Take 2 tablets (650 mg total) by mouth every 6 (six) hours as needed for mild pain (or Fever >/= 101). 12 tablet 0  . allopurinol (ZYLOPRIM) 100 MG tablet Take 1 tablet (100 mg total) by mouth daily. 30 tablet 2  . diphenhydrAMINE (BENADRYL) 25 mg capsule Take 1 capsule (25 mg total) by mouth daily as needed for itching or allergies (for itching 60 minutes prior to infusion.). 15 capsule 0  . fluticasone (FLONASE) 50 MCG/ACT nasal spray Place 2 sprays into both nostrils daily.     Marland Kitchen loratadine (CLARITIN) 10 MG tablet Take 10 mg by mouth daily.    Marland Kitchen olmesartan-hydrochlorothiazide (BENICAR HCT) 40-25 MG tablet TAKE 1 TABLET ONCE DAILY 90 tablet 1  . pregabalin (LYRICA) 150 MG capsule Take 150 mg by mouth 2 (two) times daily.     . VENTOLIN HFA 108 (90 Base) MCG/ACT inhaler      No current facility-administered medications on file prior to visit.   Past Medical History:  Diagnosis Date  . Chronic lower back pain    "pulled muscle; have bone spurs" (01/11/2018)  . Cryptococcal pneumonitis ([place])   . Follicular lymphoma ([place]) 2011    recurrent/notes 01/11/2018  . GERD (gastroesophageal reflux disease)    "food related" (01/11/2018)  . History of gout   . History of stomach ulcers 1990s  . HTN  (hypertension)   . Neuropathy   . Pneumonia    recurrent/notes 01/11/2018  . PONV (postoperative nausea and vomiting)   . Social anxiety disorder    Past Surgical History:  Procedure Laterality Date  . AXILLARY LYMPH NODE BIOPSY Left 2011  . CARPAL TUNNEL RELEASE Right   . KNEE ARTHROSCOPY Bilateral   . LUMBAR PUNCTURE  12/2017    post LP HA and found to have csf leak requiring blood patch/notes 01/02/2018  . PORTA CATH INSERTION Left 2011  . TONSILLECTOMY      Family History  Problem Relation Age of Onset  . Hypertension Mother   . Leukemia Mother   . Diabetes Mellitus II Mother   . Leukemia Father   . Diabetes type II Father    Social History   Socioeconomic History  . Marital status: Married    Spouse name: Not on file  . Number of children: 1  . Years of education: Not on file  . Highest education level: Not on file  Occupational History  . Not on file  Tobacco Use  . Smoking status: Former Smoker    Packs/day: 3.00    Years: 12.00    Pack years: 36.00    Types: Cigarettes    Quit date: 2007    Years since quitting: 15.0  . Smokeless tobacco: Never Used  Vaping Use  . Vaping Use: Former  Substance and Sexual Activity  .  Alcohol use: Yes    Alcohol/week: 2.0 standard drinks    Types: 2 Cans of beer per week  . Drug use: Not Currently  . Sexual activity: Yes  Other Topics Concern  . Not on file  Social History Narrative  . Not on file   Social Determinants of Health   Financial Resource Strain: Not on file  Food Insecurity: Not on file  Transportation Needs: Not on file  Physical Activity: Not on file  Stress: Not on file  Social Connections: Not on file    Review of Systems  Constitutional: Negative for chills, diaphoresis, fatigue and fever.  HENT: Negative for congestion, ear pain and sore throat.   Respiratory: Negative for cough and shortness of breath.   Cardiovascular: Negative for chest pain and leg swelling.  Gastrointestinal: Negative  for abdominal pain, constipation, diarrhea, nausea and vomiting.  Genitourinary: Negative for dysuria and urgency.  Musculoskeletal: Negative for arthralgias and myalgias.  Neurological: Negative for dizziness and headaches.  Psychiatric/Behavioral: Negative for dysphoric mood.     Objective:  BP 132/78 (BP Location: Left Arm, Patient Position: Sitting)   Pulse 86   Temp 98 F (36.7 C) (Temporal)   Ht 6' 3"  (1.905 m)   Wt 298 lb (135.2 kg)   SpO2 99%   BMI 37.25 kg/m   BP/Weight 08/01/2020 01/30/2020 12/29/9483  Systolic BP 462 703 -  Diastolic BP 78 84 -  Wt. (Lbs) 298 296 280  BMI 37.25 37 35    Physical Exam Vitals reviewed.  Constitutional:      Appearance: Normal appearance.  Cardiovascular:     Rate and Rhythm: Normal rate and regular rhythm.  Pulmonary:     Effort: Pulmonary effort is normal.     Breath sounds: Normal breath sounds.  Abdominal:     General: Bowel sounds are normal.  Musculoskeletal:        General: Normal range of motion.     Cervical back: Normal range of motion.  Skin:    General: Skin is warm.  Neurological:     Mental Status: He is alert.  Psychiatric:        Mood and Affect: Mood normal.        Behavior: Behavior normal.     Diabetic Foot Exam - Simple   No data filed      Lab Results  Component Value Date   WBC 5.2 08/01/2020   HGB 16.1 08/01/2020   HCT 46.8 08/01/2020   PLT 219 08/01/2020   GLUCOSE 139 (H) 08/01/2020   CHOL 209 (H) 08/01/2020   TRIG 314 (H) 08/01/2020   HDL 30 (L) 08/01/2020   LDLCALC 123 (H) 08/01/2020   ALT 126 (H) 08/01/2020   AST 70 (H) 08/01/2020   NA 141 08/01/2020   K 4.4 08/01/2020   CL 99 08/01/2020   CREATININE 1.25 08/01/2020   BUN 14 08/01/2020   CO2 27 08/01/2020      Assessment & Plan:   1. Essential hypertension Well controlled.  No changes to medicines.  Continue to work on eating a healthy diet and exercise.  Labs drawn today.  - CBC with Differential/Platelet -  Comprehensive metabolic panel  2. Follicular lymphoma, unspecified grade, unspecified body region [facility]) Follow up with oncology.   3. Mixed hyperlipidemia Well controlled.  No changes to medicines.  Continue to work on eating a healthy diet and exercise.  Labs drawn today.  - Lipid panel - Cardiovascular Risk Assessment  4. Chemotherapy-induced neuropathy (  [place]) Continue lyrica.   5. Gout, unspecified cause, unspecified chronicity, unspecified site - Colchicine (MITIGARE) 0.6 MG CAPS; One twice a day x 1 week or gout flare.  Dispense: 14 capsule; Refill: 2    Meds ordered this encounter  Medications  . Colchicine (MITIGARE) 0.6 MG CAPS    Sig: One twice a day x 1 week or gout flare.    Dispense:  14 capsule    Refill:  2    Orders Placed This Encounter  Procedures  . CBC with Differential/Platelet  . Comprehensive metabolic panel  . Lipid panel  . Cardiovascular Risk Assessment     Follow-up: Return in about 3 months (around 10/30/2020) for fasting.  An After Visit Summary was printed and given to the patient.  Rochel Brome, MD [facility] [phone]

## 2020-08-01 ENCOUNTER — Ambulatory Visit (INDEPENDENT_AMBULATORY_CARE_PROVIDER_SITE_OTHER): Payer: Medicare HMO | Admitting: Family Medicine

## 2020-08-01 ENCOUNTER — Other Ambulatory Visit: Payer: Self-pay

## 2020-08-01 ENCOUNTER — Encounter: Payer: Self-pay | Admitting: Family Medicine

## 2020-08-01 VITALS — BP 132/78 | HR 86 | Temp 98.0°F | Ht 75.0 in | Wt 298.0 lb

## 2020-08-01 DIAGNOSIS — G620 Drug-induced polyneuropathy: Secondary | ICD-10-CM

## 2020-08-01 DIAGNOSIS — C8290 Follicular lymphoma, unspecified, unspecified site: Secondary | ICD-10-CM

## 2020-08-01 DIAGNOSIS — T451X5A Adverse effect of antineoplastic and immunosuppressive drugs, initial encounter: Secondary | ICD-10-CM

## 2020-08-01 DIAGNOSIS — I10 Essential (primary) hypertension: Secondary | ICD-10-CM

## 2020-08-01 DIAGNOSIS — E782 Mixed hyperlipidemia: Secondary | ICD-10-CM | POA: Diagnosis not present

## 2020-08-01 DIAGNOSIS — M109 Gout, unspecified: Secondary | ICD-10-CM | POA: Diagnosis not present

## 2020-08-01 DIAGNOSIS — K219 Gastro-esophageal reflux disease without esophagitis: Secondary | ICD-10-CM

## 2020-08-01 MED ORDER — COLCHICINE 0.6 MG PO CAPS: ORAL_CAPSULE | ORAL | 2 refills | Status: DC

## 2020-08-02 LAB — CBC WITH DIFFERENTIAL/PLATELET
Basophils Absolute: 0 10*3/uL (ref 0.0–0.2)
Basos: 1 %
EOS (ABSOLUTE): 0.1 10*3/uL (ref 0.0–0.4)
Eos: 2 %
Hematocrit: 46.8 % (ref 37.5–51.0)
Hemoglobin: 16.1 g/dL (ref 13.0–17.7)
Immature Grans (Abs): 0 10*3/uL (ref 0.0–0.1)
Immature Granulocytes: 0 %
Lymphocytes Absolute: 1.8 10*3/uL (ref 0.7–3.1)
Lymphs: 34 %
MCH: 32.2 pg (ref 26.6–33.0)
MCHC: 34.4 g/dL (ref 31.5–35.7)
MCV: 94 fL (ref 79–97)
Monocytes Absolute: 0.5 10*3/uL (ref 0.1–0.9)
Monocytes: 10 %
Neutrophils Absolute: 2.8 10*3/uL (ref 1.4–7.0)
Neutrophils: 53 %
Platelets: 219 10*3/uL (ref 150–450)
RBC: 5 x10E6/uL (ref 4.14–5.80)
RDW: 14 % (ref 11.6–15.4)
WBC: 5.2 10*3/uL (ref 3.4–10.8)

## 2020-08-02 LAB — LIPID PANEL
Chol/HDL Ratio: 7 ratio — ABNORMAL HIGH (ref 0.0–5.0)
Cholesterol, Total: 209 mg/dL — ABNORMAL HIGH (ref 100–199)
HDL: 30 mg/dL — ABNORMAL LOW (ref >39)
LDL Chol Calc (NIH): 123 mg/dL — ABNORMAL HIGH (ref 0–99)
Triglycerides: 314 mg/dL — ABNORMAL HIGH (ref 0–149)
VLDL Cholesterol Cal: 56 mg/dL — ABNORMAL HIGH (ref 5–40)

## 2020-08-02 LAB — COMPREHENSIVE METABOLIC PANEL
ALT: 126 IU/L — ABNORMAL HIGH (ref 0–44)
AST: 70 IU/L — ABNORMAL HIGH (ref 0–40)
Albumin/Globulin Ratio: 2.9 — ABNORMAL HIGH (ref 1.2–2.2)
Albumin: 4.6 g/dL (ref 4.0–5.0)
Alkaline Phosphatase: 74 IU/L (ref 44–121)
BUN/Creatinine Ratio: 11 (ref 9–20)
BUN: 14 mg/dL (ref 6–24)
Bilirubin Total: 0.7 mg/dL (ref 0.0–1.2)
CO2: 27 mmol/L (ref 20–29)
Calcium: 10 mg/dL (ref 8.7–10.2)
Chloride: 99 mmol/L (ref 96–106)
Creatinine, Ser: 1.25 mg/dL (ref 0.76–1.27)
GFR calc Af Amer: 81 mL/min/{1.73_m2} (ref >59)
GFR calc non Af Amer: 70 mL/min/{1.73_m2} (ref >59)
Globulin, Total: 1.6 g/dL (ref 1.5–4.5)
Glucose: 139 mg/dL — ABNORMAL HIGH (ref 65–99)
Potassium: 4.4 mmol/L (ref 3.5–5.2)
Sodium: 141 mmol/L (ref 134–144)
Total Protein: 6.2 g/dL (ref 6.0–8.5)

## 2020-08-02 LAB — CARDIOVASCULAR RISK ASSESSMENT

## 2020-08-06 ENCOUNTER — Encounter: Payer: Self-pay | Admitting: Family Medicine

## 2020-08-06 LAB — HEPATITIS PANEL, ACUTE
Hep A IgM: NEGATIVE
Hep B C IgM: NEGATIVE
Hep C Virus Ab: 0.1 s/co ratio (ref 0.0–0.9)
Hepatitis B Surface Ag: NEGATIVE

## 2020-08-06 LAB — HGB A1C W/O EAG: Hgb A1c MFr Bld: 6.6 % — ABNORMAL HIGH (ref 4.8–5.6)

## 2020-08-06 LAB — SPECIMEN STATUS REPORT

## 2020-08-07 ENCOUNTER — Other Ambulatory Visit: Payer: Self-pay

## 2020-08-07 MED ORDER — OMEGA-3-ACID ETHYL ESTERS 1 G PO CAPS: 1.0000 g | ORAL_CAPSULE | Freq: Two times a day (BID) | ORAL | 0 refills | Status: DC

## 2020-08-15 ENCOUNTER — Other Ambulatory Visit: Payer: Self-pay | Admitting: Family Medicine

## 2020-10-01 ENCOUNTER — Other Ambulatory Visit: Payer: Self-pay | Admitting: Hematology and Oncology

## 2020-10-01 MED ORDER — PREGABALIN 150 MG PO CAPS: 150.0000 mg | ORAL_CAPSULE | Freq: Two times a day (BID) | ORAL | 1 refills | Status: DC

## 2020-10-24 ENCOUNTER — Telehealth: Payer: Self-pay

## 2020-10-24 ENCOUNTER — Other Ambulatory Visit: Payer: Self-pay | Admitting: Oncology

## 2020-10-24 DIAGNOSIS — C8228 Follicular lymphoma grade III, unspecified, lymph nodes of multiple sites: Secondary | ICD-10-CM

## 2020-10-24 NOTE — Telephone Encounter (Signed)
I looked in [place] to investigate. He does have pending CT orders for chest, abd & pelvis-- all with contrast, & labs...in [place]. Theses orders have not been moved to Epic, which is why they haven't been scheduled.  I notified Dr Bobby Rumpf to please enter orders into Epic.  I then called pt & notified that orders are now being entered in to Epic by Dr Bobby Rumpf. Butch Penny will check for authorization, then schedulers will call him for appt's for scans, & F/U visit.  Pt verbalized understanding.

## 2020-10-27 NOTE — Progress Notes (Deleted)
  [facility]  [address] [place],  [place]  [address] [phone]  Clinic Day:  10/27/2020  Referring physician: Rochel Brome, MD   HISTORY OF PRESENT ILLNESS:  The patient is a 45 y.o. male with  recurrent follicular lymphoma.  From September 2015 to June 9833, his follicular lymphoma was kept under ideal control with Revlimid/Rituxan.  The patient had a cryptococcal infection which led to his Revlimid/Rituxan being held, which has been the case since June 2019.  Fortunately, all physical exams, labs, and scans done since then have not shown any evidence of recurrent lymphoma.  The patient comes in today for routine follow up.  Since his last visit, the patient has been doing well.  He denies having any B symptoms or lymphadenopathy which concerns him for his follicular lymphoma returning despite being off of any form of therapy for over 2 years.   PHYSICAL EXAM:  There were no vitals taken for this visit. Wt Readings from Last 3 Encounters:  08/01/20 298 lb (135.2 kg)  01/30/20 (!) 296 lb (134.3 kg)  01/29/19 280 lb (127 kg)   There is no height or weight on file to calculate BMI. Performance status (ECOG): {CHL ONC [id number] Physical Exam  LABS:   CBC Latest Ref Rng & Units 08/01/2020 01/30/2020 01/15/2018  WBC 3.4 - 10.8 x10E3/uL 5.2 4.9 2.8(L)  Hemoglobin 13.0 - 17.7 g/dL 16.1 15.1 13.7  Hematocrit 37.5 - 51.0 % 46.8 42.0 38.8(L)  Platelets 150 - 450 x10E3/uL 219 238 197   CMP Latest Ref Rng & Units 08/01/2020 01/30/2020 11/13/2018  Glucose 65 - 99 mg/dL 139(H) 104(H) -  BUN 6 - 24 mg/dL 14 17 -  Creatinine 0.76 - 1.27 mg/dL 1.25 1.46(H) -  Sodium 134 - 144 mmol/L 141 139 -  Potassium 3.5 - 5.2 mmol/L 4.4 4.4 -  Chloride 96 - 106 mmol/L 99 98 -  CO2 20 - 29 mmol/L 27 26 -  Calcium 8.7 - 10.2 mg/dL 10.0 9.7 -  Total Protein 6.0 - 8.5 g/dL 6.2 6.2 6.0(L)  Total Bilirubin 0.0 - 1.2 mg/dL 0.7 0.8 0.6  Alkaline Phos 44 - 121 IU/L 74 73 -   AST 0 - 40 IU/L 70(H) 48(H) 60(H)  ALT 0 - 44 IU/L 126(H) 86(H) 120(H)     No results found for: CEA1 / No results found for: CEA1 No results found for: PSA1 No results found for: ASN053 No results found for: CAN125  No results found for: TOTALPROTELP, ALBUMINELP, A1GS, A2GS, BETS, BETA2SER, GAMS, MSPIKE, SPEI No results found for: TIBC, FERRITIN, IRONPCTSAT No results found for: LDH  No results found for: AFPTUMOR, TOTALPROTELP, ALBUMINELP, A1GS, A2GS, BETS, BETA2SER, GAMS, MSPIKE, SPEI, LDH, CEA1, PSA1, IGASERUM, IGGSERUM, IGMSERUM, THGAB, THYROGLB  Recent Review Flowsheet Data   There is no flowsheet data to display.      STUDIES:  No results found.    ASSESSMENT & PLAN:   Assessment/Plan:  A 45 y.o. male with *** .The patient understands all the plans discussed today and is in agreement with them.      Rashaud Ybarbo Macarthur Critchley, MD

## 2020-10-28 ENCOUNTER — Inpatient Hospital Stay: Payer: Medicare HMO | Admitting: Oncology

## 2020-11-03 ENCOUNTER — Other Ambulatory Visit: Payer: Self-pay

## 2020-11-03 ENCOUNTER — Encounter: Payer: Self-pay | Admitting: Family Medicine

## 2020-11-03 ENCOUNTER — Ambulatory Visit (INDEPENDENT_AMBULATORY_CARE_PROVIDER_SITE_OTHER): Payer: Medicare HMO | Admitting: Family Medicine

## 2020-11-03 VITALS — BP 114/80 | HR 88 | Temp 97.4°F | Resp 16 | Ht 75.0 in | Wt 294.0 lb

## 2020-11-03 DIAGNOSIS — I10 Essential (primary) hypertension: Secondary | ICD-10-CM | POA: Diagnosis not present

## 2020-11-03 DIAGNOSIS — E782 Mixed hyperlipidemia: Secondary | ICD-10-CM | POA: Diagnosis not present

## 2020-11-03 DIAGNOSIS — C8280 Other types of follicular lymphoma, unspecified site: Secondary | ICD-10-CM | POA: Diagnosis not present

## 2020-11-03 DIAGNOSIS — E118 Type 2 diabetes mellitus with unspecified complications: Secondary | ICD-10-CM | POA: Diagnosis not present

## 2020-11-03 DIAGNOSIS — Z6836 Body mass index (BMI) 36.0-36.9, adult: Secondary | ICD-10-CM | POA: Diagnosis not present

## 2020-11-03 DIAGNOSIS — H6981 Other specified disorders of Eustachian tube, right ear: Secondary | ICD-10-CM | POA: Diagnosis not present

## 2020-11-03 LAB — POCT UA - MICROALBUMIN: Microalbumin Ur, POC: 10 mg/L

## 2020-11-03 MED ORDER — PREDNISONE 50 MG PO TABS
50.0000 mg | ORAL_TABLET | Freq: Every day | ORAL | 0 refills | Status: DC
Start: 2020-11-03 — End: 2021-03-05

## 2020-11-03 NOTE — Progress Notes (Signed)
Subjective:  Patient ID: Michael Woods, male    DOB: August 06, 1975  Age: 45 y.o. MRN: [id number]  Chief Complaint  Patient presents with  . Hypertension    HPI  Michael Woods is a 45 year old white male who presents for follow-up of chronic medical problems which include follicular lymphoma diagnosed in 0973 and complicated by cryptococcal pulmonary infection in 2020.  He has been off of his chemotherapy for over a year.  He sees Dr. Lavera Guise.  While he had cryptococcal pneumonia he saw Dr. Carlyle Basques with infectious disease.  At his last visit he was diagnosed with diabetes in January 2022.  His A1c was 6.5.  Patient also has hyperlipidemia and is currently taking fenofibrate 160 mg once daily.  He is tolerating this medication well.  He also has hypertension and is currently taking Benicar HCT 40/25 mg once daily.  Patient has allergies and takes loratadine 10 mg once daily and Flonase 2 puffs daily.  He has been having problems with pressure in his right ear for approximately 2 months.  He was seen at an was instructed to use the Flonase in the hopes that this would help however it has not.  Current Outpatient Medications on File Prior to Visit  Medication Sig Dispense Refill  . acetaminophen (TYLENOL) 325 MG tablet Take 2 tablets (650 mg total) by mouth every 6 (six) hours as needed for mild pain (or Fever >/= 101). 12 tablet 0  . allopurinol (ZYLOPRIM) 100 MG tablet Take 1 tablet (100 mg total) by mouth daily. 30 tablet 2  . Colchicine (MITIGARE) 0.6 MG CAPS One twice a day x 1 week or gout flare. 14 capsule 2  . fluticasone (FLONASE) 50 MCG/ACT nasal spray Place 2 sprays into both nostrils daily.     Marland Kitchen loratadine (CLARITIN) 10 MG tablet Take 10 mg by mouth daily.    Marland Kitchen olmesartan-hydrochlorothiazide (BENICAR HCT) 40-25 MG tablet TAKE 1 TABLET ONCE DAILY 90 tablet 1  . omega-3 acid ethyl esters (LOVAZA) 1 g capsule Take 1 capsule (1 g total) by mouth 2 (two) times daily. 360  capsule 0  . pregabalin (LYRICA) 150 MG capsule Take 1 capsule (150 mg total) by mouth 2 (two) times daily. 180 capsule 1  . VENTOLIN HFA 108 (90 Base) MCG/ACT inhaler      No current facility-administered medications on file prior to visit.   Past Medical History:  Diagnosis Date  . Chronic lower back pain    "pulled muscle; have bone spurs" (01/11/2018)  . Cryptococcal pneumonitis ([place])   . Diabetes ([place]) 07/2020   new diagnosis  . Follicular lymphoma ([place]) 2011    recurrent/notes 01/11/2018  . GERD (gastroesophageal reflux disease)    "food related" (01/11/2018)  . History of gout   . History of stomach ulcers 1990s  . HTN (hypertension)   . Neuropathy   . Pneumonia    recurrent/notes 01/11/2018  . PONV (postoperative nausea and vomiting)   . Social anxiety disorder    Past Surgical History:  Procedure Laterality Date  . AXILLARY LYMPH NODE BIOPSY Left 2011  . CARPAL TUNNEL RELEASE Right   . KNEE ARTHROSCOPY Bilateral   . LUMBAR PUNCTURE  12/2017    post LP HA and found to have csf leak requiring blood patch/notes 01/02/2018  . PORTA CATH INSERTION Left 2011  . TONSILLECTOMY      Family History  Problem Relation Age of Onset  . Hypertension Mother   . Leukemia Mother   .  Diabetes Mellitus II Mother   . Leukemia Father   . Diabetes type II Father    Social History   Socioeconomic History  . Marital status: Married    Spouse name: Not on file  . Number of children: 1  . Years of education: Not on file  . Highest education level: Not on file  Occupational History  . Not on file  Tobacco Use  . Smoking status: Former Smoker    Packs/day: 3.00    Years: 12.00    Pack years: 36.00    Types: Cigarettes    Quit date: 2007    Years since quitting: 15.3  . Smokeless tobacco: Never Used  Vaping Use  . Vaping Use: Former  Substance and Sexual Activity  . Alcohol use: Yes    Alcohol/week: 2.0 standard drinks    Types: 2 Cans of beer per week  . Drug use: Not  Currently  . Sexual activity: Yes  Other Topics Concern  . Not on file  Social History Narrative  . Not on file   Social Determinants of Health   Financial Resource Strain: Not on file  Food Insecurity: Not on file  Transportation Needs: Not on file  Physical Activity: Not on file  Stress: Not on file  Social Connections: Not on file    Review of Systems  Constitutional: Negative for chills and fever.  HENT: Negative for congestion, ear pain and sore throat.   Respiratory: Negative for cough and shortness of breath.   Cardiovascular: Negative for chest pain.  Gastrointestinal: Negative for abdominal pain, constipation, nausea and vomiting.  Musculoskeletal: Positive for arthralgias (Right hip pain.  This is particularly bad when he lays flat.  Patient had some back issues approximately 2 months ago, which has improved.).  Psychiatric/Behavioral: Negative for dysphoric mood. The patient is not nervous/anxious.      Objective:  BP 114/80   Pulse 88   Temp (!) 97.4 F (36.3 C)   Resp 16   Ht 6' 3"  (1.905 m)   Wt 294 lb (133.4 kg)   BMI 36.75 kg/m   BP/Weight 11/03/2020 08/01/2020 9/47/0962  Systolic BP 836 629 476  Diastolic BP 80 78 84  Wt. (Lbs) 294 298 296  BMI 36.75 37.25 37    Physical Exam Constitutional:      Appearance: Normal appearance. He is obese.  HENT:     Right Ear: Tympanic membrane, ear canal and external ear normal.     Left Ear: Tympanic membrane, ear canal and external ear normal.     Ears:     Comments: Decreased movement of TM.  Neck:     Vascular: No carotid bruit.  Cardiovascular:     Rate and Rhythm: Normal rate and regular rhythm.     Heart sounds: Normal heart sounds.  Pulmonary:     Effort: Pulmonary effort is normal. No respiratory distress.     Breath sounds: Normal breath sounds. No wheezing, rhonchi or rales.  Abdominal:     General: Bowel sounds are normal.     Palpations: Abdomen is soft.     Tenderness: There is no  abdominal tenderness.  Musculoskeletal:        General: Tenderness (inguinal region. ) present. Normal range of motion.  Neurological:     Mental Status: He is alert and oriented to person, place, and time.  Psychiatric:        Mood and Affect: Mood normal.        Behavior:  Behavior normal.     Diabetic Foot Exam - Simple   No data filed      Lab Results  Component Value Date   WBC 5.2 08/01/2020   HGB 16.1 08/01/2020   HCT 46.8 08/01/2020   PLT 219 08/01/2020   GLUCOSE 139 (H) 08/01/2020   CHOL 209 (H) 08/01/2020   TRIG 314 (H) 08/01/2020   HDL 30 (L) 08/01/2020   LDLCALC 123 (H) 08/01/2020   ALT 126 (H) 08/01/2020   AST 70 (H) 08/01/2020   NA 141 08/01/2020   K 4.4 08/01/2020   CL 99 08/01/2020   CREATININE 1.25 08/01/2020   BUN 14 08/01/2020   CO2 27 08/01/2020   HGBA1C 6.6 (H) 08/01/2020   MICROALBUR 10 11/03/2020      Assessment & Plan:  1. Essential hypertension Well controlled.  No changes to medicines.  Continue to work on eating a healthy diet and exercise.  Labs drawn today.  - CBC with Differential/Platelet - Comprehensive metabolic panel  2. Other type of follicular lymphoma, unspecified body region [facility]) Continue follow up with oncology.   3. Mixed hyperlipidemia Elevated.  Start on vascepa 1 gm 2 capsules twice a day.  Recommend continue to work on eating healthy diet and exercise. - Lipid panel  4. Controlled type 2 diabetes mellitus with complication, without long-term current use of insulin (HCC) Control: good Recommend check feet daily. Recommend annual eye exams. Medicines: none Continue to work on eating a healthy diet and exercise.  Labs drawn today.   - Hemoglobin A1c - POCT UA - Microalbumin  5. Dysfunction of right eustachian tube Prednisone rx.   6. Severe obesity with body mass index (BMI) of 36.0 to 36.9 with serious comorbidity ([place]) Recommend continue to work on eating healthy diet and exercise.    Meds ordered  this encounter  Medications  . predniSONE (DELTASONE) 50 MG tablet    Sig: Take 1 tablet (50 mg total) by mouth daily with breakfast.    Dispense:  5 tablet    Refill:  0    Orders Placed This Encounter  Procedures  . CBC with Differential/Platelet  . Comprehensive metabolic panel  . Hemoglobin A1c  . Lipid panel  . POCT UA - Microalbumin     Follow-up: Return in about 3 months (around 02/03/2021) for fasting.  An After Visit Summary was printed and given to the patient.  Rochel Brome, MD [facility] [phone]

## 2020-11-04 DIAGNOSIS — K760 Fatty (change of) liver, not elsewhere classified: Secondary | ICD-10-CM | POA: Diagnosis not present

## 2020-11-04 DIAGNOSIS — C8590 Non-Hodgkin lymphoma, unspecified, unspecified site: Secondary | ICD-10-CM | POA: Diagnosis not present

## 2020-11-04 DIAGNOSIS — N289 Disorder of kidney and ureter, unspecified: Secondary | ICD-10-CM | POA: Diagnosis not present

## 2020-11-04 DIAGNOSIS — J984 Other disorders of lung: Secondary | ICD-10-CM | POA: Diagnosis not present

## 2020-11-04 DIAGNOSIS — R918 Other nonspecific abnormal finding of lung field: Secondary | ICD-10-CM | POA: Diagnosis not present

## 2020-11-04 DIAGNOSIS — C8290 Follicular lymphoma, unspecified, unspecified site: Secondary | ICD-10-CM | POA: Diagnosis not present

## 2020-11-04 DIAGNOSIS — J8410 Pulmonary fibrosis, unspecified: Secondary | ICD-10-CM | POA: Diagnosis not present

## 2020-11-04 LAB — CARDIOVASCULAR RISK ASSESSMENT

## 2020-11-04 LAB — CBC WITH DIFFERENTIAL/PLATELET
Basophils Absolute: 0 10*3/uL (ref 0.0–0.2)
Basos: 1 %
EOS (ABSOLUTE): 0.1 10*3/uL (ref 0.0–0.4)
Eos: 3 %
Hematocrit: 47.4 % (ref 37.5–51.0)
Hemoglobin: 16.1 g/dL (ref 13.0–17.7)
Immature Grans (Abs): 0 10*3/uL (ref 0.0–0.1)
Immature Granulocytes: 0 %
Lymphocytes Absolute: 1.5 10*3/uL (ref 0.7–3.1)
Lymphs: 30 %
MCH: 31.6 pg (ref 26.6–33.0)
MCHC: 34 g/dL (ref 31.5–35.7)
MCV: 93 fL (ref 79–97)
Monocytes Absolute: 0.5 10*3/uL (ref 0.1–0.9)
Monocytes: 10 %
Neutrophils Absolute: 2.9 10*3/uL (ref 1.4–7.0)
Neutrophils: 56 %
Platelets: 201 10*3/uL (ref 150–450)
RBC: 5.09 x10E6/uL (ref 4.14–5.80)
RDW: 13.6 % (ref 11.6–15.4)
WBC: 5 10*3/uL (ref 3.4–10.8)

## 2020-11-04 LAB — COMPREHENSIVE METABOLIC PANEL
ALT: 133 IU/L — ABNORMAL HIGH (ref 0–44)
AST: 83 IU/L — ABNORMAL HIGH (ref 0–40)
Albumin/Globulin Ratio: 2.6 — ABNORMAL HIGH (ref 1.2–2.2)
Albumin: 4.7 g/dL (ref 4.0–5.0)
Alkaline Phosphatase: 71 IU/L (ref 44–121)
BUN/Creatinine Ratio: 12 (ref 9–20)
BUN: 14 mg/dL (ref 6–24)
Bilirubin Total: 0.9 mg/dL (ref 0.0–1.2)
CO2: 26 mmol/L (ref 20–29)
Calcium: 10 mg/dL (ref 8.7–10.2)
Chloride: 100 mmol/L (ref 96–106)
Creatinine, Ser: 1.21 mg/dL (ref 0.76–1.27)
Globulin, Total: 1.8 g/dL (ref 1.5–4.5)
Glucose: 111 mg/dL — ABNORMAL HIGH (ref 65–99)
Potassium: 4.3 mmol/L (ref 3.5–5.2)
Sodium: 140 mmol/L (ref 134–144)
Total Protein: 6.5 g/dL (ref 6.0–8.5)
eGFR: 76 mL/min/{1.73_m2} (ref >59)

## 2020-11-04 LAB — HEMOGLOBIN A1C
Est. average glucose Bld gHb Est-mCnc: 143 mg/dL
Hgb A1c MFr Bld: 6.6 % — ABNORMAL HIGH (ref 4.8–5.6)

## 2020-11-04 LAB — LIPID PANEL
Chol/HDL Ratio: 7.1 ratio — ABNORMAL HIGH (ref 0.0–5.0)
Cholesterol, Total: 219 mg/dL — ABNORMAL HIGH (ref 100–199)
HDL: 31 mg/dL — ABNORMAL LOW (ref >39)
LDL Chol Calc (NIH): 143 mg/dL — ABNORMAL HIGH (ref 0–99)
Triglycerides: 247 mg/dL — ABNORMAL HIGH (ref 0–149)
VLDL Cholesterol Cal: 45 mg/dL — ABNORMAL HIGH (ref 5–40)

## 2020-11-05 NOTE — Progress Notes (Signed)
[facility]  [address] [place],  [place]  [address] [phone]  Clinic Day:  11/06/2020  Referring physician: Rochel Brome, MD   HISTORY OF PRESENT ILLNESS:  The patient is a 45 y.o. male with  recurrent follicular lymphoma.  From September 2015 to June 6314, his follicular lymphoma was kept under ideal control with Revlimid/Rituxan.  The patient had a cryptococcal infection which led to his Revlimid/Rituxan being held, which has been the case since June 2019.  Fortunately, all physical exams, labs, and scans done since then have not shown any evidence of recurrent lymphoma.  The patient comes in today for routine follow up, as well as to review his most recent scans.  Since his last visit, the patient has been doing well.  He denies having any B symptoms or lymphadenopathy which concerns him for his follicular lymphoma returning despite being off any form of therapy for nearly 3 years.   PHYSICAL EXAM:  Blood pressure 132/81, pulse 86, temperature 98.6 F (37 C), resp. rate 16, height 6' 3"  (1.905 m), weight 293 lb 1.6 oz (132.9 kg), SpO2 97 %. Wt Readings from Last 3 Encounters:  11/06/20 293 lb 1.6 oz (132.9 kg)  11/03/20 294 lb (133.4 kg)  08/01/20 298 lb (135.2 kg)   Body mass index is 36.63 kg/m. Performance status (ECOG): 0 Physical Exam Constitutional:      Appearance: Normal appearance. He is not ill-appearing.  HENT:     Mouth/Throat:     Mouth: Mucous membranes are moist.     Pharynx: Oropharynx is clear. No oropharyngeal exudate or posterior oropharyngeal erythema.  Cardiovascular:     Rate and Rhythm: Normal rate and regular rhythm.     Heart sounds: No murmur heard. No friction rub. No gallop.   Pulmonary:     Effort: Pulmonary effort is normal. No respiratory distress.     Breath sounds: Normal breath sounds. No wheezing, rhonchi or rales.  Chest:  Breasts:     Right: No axillary adenopathy or supraclavicular adenopathy.      Left: No axillary adenopathy or supraclavicular adenopathy.    Abdominal:     General: Bowel sounds are normal. There is no distension.     Palpations: Abdomen is soft. There is no mass.     Tenderness: There is no abdominal tenderness.  Musculoskeletal:        General: No swelling.     Right lower leg: No edema.     Left lower leg: No edema.  Lymphadenopathy:     Cervical: No cervical adenopathy.     Upper Body:     Right upper body: No supraclavicular or axillary adenopathy.     Left upper body: No supraclavicular or axillary adenopathy.     Lower Body: No right inguinal adenopathy. No left inguinal adenopathy.  Skin:    General: Skin is warm.     Coloration: Skin is not jaundiced.     Findings: No lesion or rash.  Neurological:     General: No focal deficit present.     Mental Status: He is alert and oriented to person, place, and time. Mental status is at baseline.     Cranial Nerves: Cranial nerves are intact.  Psychiatric:        Mood and Affect: Mood normal.        Behavior: Behavior normal.        Thought Content: Thought content normal.    LABS:   CBC Latest  Ref Rng & Units 11/03/2020 08/01/2020 01/30/2020  WBC 3.4 - 10.8 x10E3/uL 5.0 5.2 4.9  Hemoglobin 13.0 - 17.7 g/dL 16.1 16.1 15.1  Hematocrit 37.5 - 51.0 % 47.4 46.8 42.0  Platelets 150 - 450 x10E3/uL 201 219 238   CMP Latest Ref Rng & Units 11/03/2020 08/01/2020 01/30/2020  Glucose 65 - 99 mg/dL 111(H) 139(H) 104(H)  BUN 6 - 24 mg/dL 14 14 17   Creatinine 0.76 - 1.27 mg/dL 1.21 1.25 1.46(H)  Sodium 134 - 144 mmol/L 140 141 139  Potassium 3.5 - 5.2 mmol/L 4.3 4.4 4.4  Chloride 96 - 106 mmol/L 100 99 98  CO2 20 - 29 mmol/L 26 27 26   Calcium 8.7 - 10.2 mg/dL 10.0 10.0 9.7  Total Protein 6.0 - 8.5 g/dL 6.5 6.2 6.2  Total Bilirubin 0.0 - 1.2 mg/dL 0.9 0.7 0.8  Alkaline Phos 44 - 121 IU/L 71 74 73  AST 0 - 40 IU/L 83(H) 70(H) 48(H)  ALT 0 - 44 IU/L 133(H) 126(H) 86(H)    SCANS:  The CT scans of his  chest/abdomen/pelvis revealed the following: FINDINGS: CT CHEST FINDINGS  Cardiovascular: Left chest wall port with tip at the superior cavoatrial junction. No thoracic aortic aneurysm. No central pulmonary embolus. Normal size heart. No significant pericardial effusion/thickening.  Mediastinum/Nodes: No discrete thyroid nodularity. No pathologically enlarged mediastinal, hilar or axillary lymph nodes. Surgical clips in the left axilla.  Lungs/Pleura: Unchanged size of the nodular density within the lateral right lung apex measuring 2.1 cm on image 33/4. Unchanged size of the 9 mm subpleural nodule within the posterior left lower lobe on image 77/4. Calcified granuloma in the right upper lobe. No new or enlarging suspicious pulmonary nodule or mass. No pleural effusion. No pneumothorax.  Musculoskeletal: No chest wall mass or suspicious bone lesions identified.  CT ABDOMEN PELVIS FINDINGS  Hepatobiliary: Hepatic steatosis. No suspicious hepatic lesion. Gallbladder is unremarkable. No biliary ductal dilation.  Pancreas: Within normal limits.  Spleen: Normal in size without focal abnormality.  Adrenals/Urinary Tract: Bilateral adrenal glands are unremarkable.  Hypodense 7 mm left interpolar renal lesion which is technically too small to accurately characterize but favored represent cysts. Symmetric enhancement excretion of contrast of bilateral kidneys. No hydronephrosis. No suspicious filling defect within the opacified portions of the collecting systems and proximal ureter on delayed imaging.  Urinary bladder is grossly unremarkable for degree of distension.  Stomach/Bowel: Stomach is within normal limits. Appendix and terminal ileum are within normal limits. No evidence of bowel wall thickening, distention, or inflammatory changes.  Vascular/Lymphatic: No significant vascular findings are present. No enlarged abdominal or pelvic lymph nodes.  Reproductive: Prostate  is unremarkable.  Other: No abdominal wall hernia or abnormality. No abdominopelvic ascites.  Musculoskeletal: No acute or significant osseous findings.  IMPRESSION: 1. Stable examination including unchanged size of the bilateral pulmonary nodules. 2. No new or progressive findings within the chest, abdomen, or pelvis. Specifically no lymphadenopathy. 3. Hepatic steatosis.   ASSESSMENT & PLAN:  Assessment/Plan:  A 45 y.o. male with a history of recurrent follicular lymphoma.  Based upon his labs, physical exam, and CT scans, the patient remains disease-free.  Clinically, he continues to do very well.  As that is the case, I will see him back in 6 months for repeat clinical assessment.   The patient understands all the plans discussed today and is in agreement with them.      Ezri Landers Macarthur Critchley, MD

## 2020-11-06 ENCOUNTER — Telehealth: Payer: Self-pay | Admitting: Oncology

## 2020-11-06 ENCOUNTER — Inpatient Hospital Stay: Payer: Medicare HMO | Attending: Oncology | Admitting: Oncology

## 2020-11-06 ENCOUNTER — Other Ambulatory Visit: Payer: Self-pay | Admitting: Oncology

## 2020-11-06 ENCOUNTER — Other Ambulatory Visit: Payer: Self-pay | Admitting: Hematology and Oncology

## 2020-11-06 ENCOUNTER — Other Ambulatory Visit: Payer: Self-pay

## 2020-11-06 VITALS — BP 132/81 | HR 86 | Temp 98.6°F | Resp 16 | Ht 75.0 in | Wt 293.1 lb

## 2020-11-06 DIAGNOSIS — C8228 Follicular lymphoma grade III, unspecified, lymph nodes of multiple sites: Secondary | ICD-10-CM

## 2020-11-06 MED ORDER — IBUPROFEN 800 MG PO TABS: 800.0000 mg | ORAL_TABLET | Freq: Three times a day (TID) | ORAL | 1 refills | Status: DC | PRN

## 2020-11-06 MED ORDER — HEPARIN SOD (PORK) LOCK FLUSH 100 UNIT/ML IV SOLN
500.0000 [IU] | Freq: Once | INTRAVENOUS | Status: AC
Start: 2020-11-06 — End: ?
  Filled 2020-11-06: qty 5

## 2020-11-06 MED ORDER — HEPARIN SOD (PORK) LOCK FLUSH 100 UNIT/ML IV SOLN
500.0000 [IU] | Freq: Once | INTRAVENOUS | Status: AC
  Filled 2020-11-06: qty 5

## 2020-11-06 NOTE — Telephone Encounter (Signed)
Per 5/5 los next appt scheduled and given to patient

## 2020-11-10 ENCOUNTER — Other Ambulatory Visit: Payer: Self-pay | Admitting: Family Medicine

## 2020-11-17 ENCOUNTER — Encounter: Payer: Self-pay | Admitting: Oncology

## 2021-01-06 DIAGNOSIS — K1379 Other lesions of oral mucosa: Secondary | ICD-10-CM | POA: Diagnosis not present

## 2021-01-06 DIAGNOSIS — H60331 Swimmer's ear, right ear: Secondary | ICD-10-CM | POA: Diagnosis not present

## 2021-01-27 DIAGNOSIS — K1379 Other lesions of oral mucosa: Secondary | ICD-10-CM | POA: Diagnosis not present

## 2021-01-27 DIAGNOSIS — H938X3 Other specified disorders of ear, bilateral: Secondary | ICD-10-CM | POA: Diagnosis not present

## 2021-01-27 DIAGNOSIS — Z87891 Personal history of nicotine dependence: Secondary | ICD-10-CM | POA: Diagnosis not present

## 2021-01-27 DIAGNOSIS — H60331 Swimmer's ear, right ear: Secondary | ICD-10-CM | POA: Diagnosis not present

## 2021-02-10 ENCOUNTER — Other Ambulatory Visit: Payer: Self-pay

## 2021-02-10 MED ORDER — OLMESARTAN MEDOXOMIL-HCTZ 40-25 MG PO TABS: 1.0000 | ORAL_TABLET | Freq: Every day | ORAL | 1 refills | Status: DC

## 2021-02-13 ENCOUNTER — Ambulatory Visit: Payer: Medicare HMO | Admitting: Family Medicine

## 2021-02-28 ENCOUNTER — Telehealth: Payer: Self-pay

## 2021-02-28 NOTE — Telephone Encounter (Signed)
Unsuccessful outreach call attempted to schedule patient annual wellness visit.  Left voicemail advising to contact PCP for scheduling. Michael Woods

## 2021-03-04 NOTE — Progress Notes (Signed)
Subjective:  Patient ID: Michael Woods, male    DOB: [birth year]  Age: 45 y.o. MRN: [id number]  Chief Complaint  Patient presents with   Diabetes   Hyperlipidemia   Hypertension     HPI Diabetes:  Complications:hyperlipidemia. Neuropathy, but this is secondary to chemotherapy. On lyrica.  Glucose checking: not checking. Most recent A1C: 6.6  Current medications: none. Last Eye Exam: due Foot checks: every other day.  Lifestyle changes: not eating as healthy as he should be. Not exercising.   Hyperlipidemia: Current medications: on vascepa 1 gm 2 capsules twice a day.   Hypertension: Current medications: benicar 40-25 mg daily  Gout-On Allopurinol 100 mg daily and Colchicine with flares. Has not had a flare in a long time.   Follicular lymphoma: Initial dx was in 03/2014. Michael Woods has been off all tx for 3 1/2 yrs . He follows with Dr. Bobby Rumpf every 6 months.   NASH: elevated LFTS. Held fenofibrate at last labs draw in 11/03/2020.  Fatty liver seen on ct of abd/pelvis.   Current Outpatient Medications on File Prior to Visit  Medication Sig Dispense Refill   acetaminophen (TYLENOL) 325 MG tablet Take 2 tablets (650 mg total) by mouth every 6 (six) hours as needed for mild pain (or Fever >/= 101). 12 tablet 0   allopurinol (ZYLOPRIM) 100 MG tablet Take 1 tablet (100 mg total) by mouth daily. 30 tablet 2   Colchicine (MITIGARE) 0.6 MG CAPS One twice a day x 1 week or gout flare. 14 capsule 2   fluticasone (FLONASE) 50 MCG/ACT nasal spray Place 2 sprays into both nostrils daily.      ibuprofen (ADVIL) 800 MG tablet Take 1 tablet (800 mg total) by mouth every 8 (eight) hours as needed. 40 tablet 1   loratadine (CLARITIN) 10 MG tablet Take 10 mg by mouth daily.     olmesartan-hydrochlorothiazide (BENICAR HCT) 40-25 MG tablet Take 1 tablet by mouth daily. 90 tablet 1   pregabalin (LYRICA) 150 MG capsule Take 1 capsule (150 mg total) by mouth 2 (two) times daily. 180 capsule 1    VENTOLIN HFA 108 (90 Base) MCG/ACT inhaler      Current Facility-Administered Medications on File Prior to Visit  Medication Dose Route Frequency Provider Last Rate Last Admin   heparin lock flush 100 unit/mL  500 Units Intravenous Once Lewis, Dequincy A, MD       [START ON 05/11/2021] heparin lock flush 100 unit/mL  500 Units Intravenous Once Marice Potter, MD       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[place])    Diabetes ([place]) 07/2020   new diagnosis   Follicular lymphoma ([place]) 2011    recurrent/notes 01/11/2018   GERD (gastroesophageal reflux disease)    "food related" (01/11/2018)   History of gout    History of stomach ulcers 1990s   HTN (hypertension)    Neuropathy    Pneumonia    recurrent/notes 01/11/2018   PONV (postoperative nausea and vomiting)    Social anxiety disorder    Past Surgical History:  Procedure Laterality Date   AXILLARY LYMPH NODE BIOPSY Left 2011   CARPAL TUNNEL RELEASE Right    KNEE ARTHROSCOPY Bilateral    LUMBAR PUNCTURE  12/2017    post LP HA and found to have csf leak requiring blood patch/notes 01/02/2018   PORTA CATH INSERTION Left  2011   TONSILLECTOMY      Family History  Problem Relation Age of Onset   Hypertension Mother    Leukemia Mother    Diabetes Mellitus II Mother    Leukemia Father    Diabetes type II Father    Social History   Socioeconomic History   Marital status: Married    Spouse name: Not on file   Number of children: 1   Years of education: Not on file   Highest education level: Not on file  Occupational History   Not on file  Tobacco Use   Smoking status: Former    Packs/day: 3.00    Years: 12.00    Pack years: 36.00    Types: Cigarettes    Quit date: 2007    Years since quitting: 15.6   Smokeless tobacco: Never  Vaping Use   Vaping Use: Former  Substance and Sexual Activity   Alcohol use: Yes     Alcohol/week: 2.0 standard drinks    Types: 2 Cans of beer per week   Drug use: Not Currently   Sexual activity: Yes  Other Topics Concern   Not on file  Social History Narrative   Not on file   Social Determinants of Health   Financial Resource Strain: Not on file  Food Insecurity: Not on file  Transportation Needs: Not on file  Physical Activity: Not on file  Stress: Not on file  Social Connections: Not on file    Review of Systems  Constitutional:  Negative for chills, diaphoresis, fatigue and fever.  HENT:  Negative for congestion, ear pain and sore throat.   Respiratory:  Negative for cough and shortness of breath.   Cardiovascular:  Negative for chest pain and leg swelling.  Gastrointestinal:  Negative for abdominal pain, constipation, diarrhea, nausea and vomiting.  Genitourinary:  Negative for dysuria and urgency.  Musculoskeletal:  Negative for arthralgias and myalgias.  Neurological:  Negative for dizziness and headaches.  Psychiatric/Behavioral:  Negative for dysphoric mood.     Objective:  BP 104/76   Pulse 85   Temp (!) 97.3 F (36.3 C)   Ht 6' 3"  (1.905 m)   Wt 296 lb (134.3 kg)   SpO2 99%   BMI 37.00 kg/m   BP/Weight 03/05/2021 08/11/2534 12/06/4032  Systolic BP 742 595 638  Diastolic BP 76 81 80  Wt. (Lbs) 296 293.1 294  BMI 37 36.63 36.75    Physical Exam Vitals reviewed.  Constitutional:      Appearance: Normal appearance. He is obese.  Cardiovascular:     Rate and Rhythm: Normal rate and regular rhythm.     Heart sounds: No murmur heard. Pulmonary:     Effort: Pulmonary effort is normal.     Breath sounds: Normal breath sounds.  Abdominal:     General: Abdomen is flat. Bowel sounds are normal.     Palpations: Abdomen is soft.     Tenderness: There is no abdominal tenderness.  Neurological:     Mental Status: He is alert and oriented to person, place, and time.  Psychiatric:        Mood and Affect: Mood normal.        Behavior: Behavior  normal.    Diabetic Foot Exam - Simple   Simple Foot Form  03/05/2021 10:30 AM  Visual Inspection No deformities, no ulcerations, no other skin breakdown bilaterally: Yes Sensation Testing See comments: Yes Pulse Check Posterior Tibialis and Dorsalis pulse intact bilaterally: Yes Comments Decreased  sensation BL feet.       Lab Results  Component Value Date   WBC 5.8 03/05/2021   HGB 15.8 03/05/2021   HCT 46.0 03/05/2021   PLT 222 03/05/2021   GLUCOSE 98 03/05/2021   CHOL 215 (H) 03/05/2021   TRIG 225 (H) 03/05/2021   HDL 30 (L) 03/05/2021   LDLCALC 144 (H) 03/05/2021   ALT 72 (H) 03/05/2021   AST 41 (H) 03/05/2021   NA 140 03/05/2021   K 4.5 03/05/2021   CL 99 03/05/2021   CREATININE 1.36 (H) 03/05/2021   BUN 13 03/05/2021   CO2 27 03/05/2021   HGBA1C 6.1 (H) 03/05/2021   MICROALBUR 10 11/03/2020      Assessment & Plan:   1. Essential hypertension Well controlled.  No changes to medicines.  Continue to work on eating a healthy diet and exercise.  Labs drawn today.  - Comprehensive metabolic panel  2. Mixed hyperlipidemia Not at goal.  Recommend start on zetia 10 mg daily.  Recommend continue vascepa 1 gm 2 caps twice daily.  Continue to work on eating a healthy diet and exercise.  Labs drawn today.  - Lipid panel  3. Controlled type 2 diabetes mellitus with complication, without long-term current use of insulin (HCC) Control: good.  Recommend check sugars fasting daily. Recommend check feet daily. Recommend annual eye exams. Medicines: recommend start on mounjaro 0.25 mg once weekly.  Continue to work on eating a healthy diet and exercise.  Labs drawn today.   Schedule retinal screening - Hemoglobin A1c - CBC with Differential/Platelet   4. Gout The current medical regimen is effective;  continue present plan and medications.  5.. Follicular lymphome, Grade 3  Management per specialist. No current treatment.   6. Morbid obesity, with bmi 37  and serious comorbidities.  Recommend start Mounjaro 0.25 mg once daily.   7. NASH: Recommend weight loss.  Recommend healthy diet and exercise.    Meds ordered this encounter  Medications   icosapent Ethyl (VASCEPA) 1 g capsule    Sig: Take 2 capsules (2 g total) by mouth 2 (two) times daily.    Dispense:  360 capsule    Refill:  1     Orders Placed This Encounter  Procedures   Lipid panel   Hemoglobin A1c   Comprehensive metabolic panel   CBC with Differential/Platelet   Cardiovascular Risk Assessment      Follow-up: Return in about 4 weeks (around 04/02/2021) for AWV WITH CHECK ON NEW DM MEDICINE.  An After Visit Summary was printed and given to the patient.  Rochel Brome, MD [facility] [phone]

## 2021-03-05 ENCOUNTER — Other Ambulatory Visit: Payer: Self-pay

## 2021-03-05 ENCOUNTER — Ambulatory Visit (INDEPENDENT_AMBULATORY_CARE_PROVIDER_SITE_OTHER): Payer: Medicare HMO | Admitting: Family Medicine

## 2021-03-05 VITALS — BP 104/76 | HR 85 | Temp 97.3°F | Ht 75.0 in | Wt 296.0 lb

## 2021-03-05 DIAGNOSIS — K7581 Nonalcoholic steatohepatitis (NASH): Secondary | ICD-10-CM | POA: Diagnosis not present

## 2021-03-05 DIAGNOSIS — E782 Mixed hyperlipidemia: Secondary | ICD-10-CM | POA: Diagnosis not present

## 2021-03-05 DIAGNOSIS — M109 Gout, unspecified: Secondary | ICD-10-CM | POA: Diagnosis not present

## 2021-03-05 DIAGNOSIS — C8228 Follicular lymphoma grade III, unspecified, lymph nodes of multiple sites: Secondary | ICD-10-CM | POA: Diagnosis not present

## 2021-03-05 DIAGNOSIS — I10 Essential (primary) hypertension: Secondary | ICD-10-CM

## 2021-03-05 DIAGNOSIS — E118 Type 2 diabetes mellitus with unspecified complications: Secondary | ICD-10-CM | POA: Diagnosis not present

## 2021-03-05 MED ORDER — ICOSAPENT ETHYL 1 G PO CAPS: 2.0000 g | ORAL_CAPSULE | Freq: Two times a day (BID) | ORAL | 1 refills | Status: DC

## 2021-03-06 LAB — HEMOGLOBIN A1C
Est. average glucose Bld gHb Est-mCnc: 128 mg/dL
Hgb A1c MFr Bld: 6.1 % — ABNORMAL HIGH (ref 4.8–5.6)

## 2021-03-06 LAB — CBC WITH DIFFERENTIAL/PLATELET
Basophils Absolute: 0 10*3/uL (ref 0.0–0.2)
Basos: 1 %
EOS (ABSOLUTE): 0.1 10*3/uL (ref 0.0–0.4)
Eos: 2 %
Hematocrit: 46 % (ref 37.5–51.0)
Hemoglobin: 15.8 g/dL (ref 13.0–17.7)
Immature Grans (Abs): 0 10*3/uL (ref 0.0–0.1)
Immature Granulocytes: 0 %
Lymphocytes Absolute: 1.8 10*3/uL (ref 0.7–3.1)
Lymphs: 31 %
MCH: 31.5 pg (ref 26.6–33.0)
MCHC: 34.3 g/dL (ref 31.5–35.7)
MCV: 92 fL (ref 79–97)
Monocytes Absolute: 0.6 10*3/uL (ref 0.1–0.9)
Monocytes: 10 %
Neutrophils Absolute: 3.3 10*3/uL (ref 1.4–7.0)
Neutrophils: 56 %
Platelets: 222 10*3/uL (ref 150–450)
RBC: 5.01 x10E6/uL (ref 4.14–5.80)
RDW: 13.9 % (ref 11.6–15.4)
WBC: 5.8 10*3/uL (ref 3.4–10.8)

## 2021-03-06 LAB — LIPID PANEL
Chol/HDL Ratio: 7.2 ratio — ABNORMAL HIGH (ref 0.0–5.0)
Cholesterol, Total: 215 mg/dL — ABNORMAL HIGH (ref 100–199)
HDL: 30 mg/dL — ABNORMAL LOW (ref >39)
LDL Chol Calc (NIH): 144 mg/dL — ABNORMAL HIGH (ref 0–99)
Triglycerides: 225 mg/dL — ABNORMAL HIGH (ref 0–149)
VLDL Cholesterol Cal: 41 mg/dL — ABNORMAL HIGH (ref 5–40)

## 2021-03-06 LAB — COMPREHENSIVE METABOLIC PANEL
ALT: 72 IU/L — ABNORMAL HIGH (ref 0–44)
AST: 41 IU/L — ABNORMAL HIGH (ref 0–40)
Albumin/Globulin Ratio: 2.6 — ABNORMAL HIGH (ref 1.2–2.2)
Albumin: 4.6 g/dL (ref 4.0–5.0)
Alkaline Phosphatase: 75 IU/L (ref 44–121)
BUN/Creatinine Ratio: 10 (ref 9–20)
BUN: 13 mg/dL (ref 6–24)
Bilirubin Total: 0.8 mg/dL (ref 0.0–1.2)
CO2: 27 mmol/L (ref 20–29)
Calcium: 10.4 mg/dL — ABNORMAL HIGH (ref 8.7–10.2)
Chloride: 99 mmol/L (ref 96–106)
Creatinine, Ser: 1.36 mg/dL — ABNORMAL HIGH (ref 0.76–1.27)
Globulin, Total: 1.8 g/dL (ref 1.5–4.5)
Glucose: 98 mg/dL (ref 65–99)
Potassium: 4.5 mmol/L (ref 3.5–5.2)
Sodium: 140 mmol/L (ref 134–144)
Total Protein: 6.4 g/dL (ref 6.0–8.5)
eGFR: 66 mL/min/{1.73_m2} (ref >59)

## 2021-03-06 LAB — CARDIOVASCULAR RISK ASSESSMENT

## 2021-03-10 ENCOUNTER — Other Ambulatory Visit: Payer: Self-pay

## 2021-03-10 ENCOUNTER — Encounter: Payer: Self-pay | Admitting: Family Medicine

## 2021-03-10 MED ORDER — EZETIMIBE 10 MG PO TABS
10.0000 mg | ORAL_TABLET | Freq: Every day | ORAL | 1 refills | Status: DC
Start: 2021-03-10 — End: 2021-06-09

## 2021-03-26 ENCOUNTER — Other Ambulatory Visit: Payer: Self-pay | Admitting: Oncology

## 2021-03-26 MED ORDER — PREGABALIN 150 MG PO CAPS: 150.0000 mg | ORAL_CAPSULE | Freq: Two times a day (BID) | ORAL | 3 refills | Status: DC

## 2021-04-02 ENCOUNTER — Other Ambulatory Visit: Payer: Self-pay

## 2021-04-02 ENCOUNTER — Ambulatory Visit (INDEPENDENT_AMBULATORY_CARE_PROVIDER_SITE_OTHER): Payer: Medicare HMO | Admitting: Family Medicine

## 2021-04-02 VITALS — BP 106/70 | HR 84 | Temp 97.4°F | Resp 16 | Ht 75.0 in | Wt 297.0 lb

## 2021-04-02 DIAGNOSIS — Z0000 Encounter for general adult medical examination without abnormal findings: Secondary | ICD-10-CM | POA: Diagnosis not present

## 2021-04-02 DIAGNOSIS — Z6837 Body mass index (BMI) 37.0-37.9, adult: Secondary | ICD-10-CM

## 2021-04-02 MED ORDER — TIRZEPATIDE 5 MG/0.5ML ~~LOC~~ SOAJ: 5.0000 mg | SUBCUTANEOUS | 0 refills | Status: DC

## 2021-04-02 NOTE — Patient Instructions (Signed)
Increase mounjaro to 0.5 mg weekly. Sent 90 day rx.

## 2021-04-02 NOTE — Progress Notes (Signed)
Subjective:  Patient ID: Michael Woods, male    DOB: 30-Aug-1975  Age: 45 y.o. MRN: [id number]  Chief Complaint  Patient presents with   Annual Exam    HPI Well Adult Physical: Patient here for a comprehensive physical exam.The patient reports no problems Do you take any herbs or supplements that were not prescribed by a doctor? no Are you taking calcium supplements? no Are you taking aspirin daily? no  Encounter for general adult medical examination without abnormal findings  Physical ("At Risk" items are starred): Patient's last physical exam was 1 year ago .  Smoking: Life-long non-smoker ;  Physical Activity: Exercises at least 3 times per week ;  Alcohol/Drug Use: Is a non-drinker ; No illicit drug use ;  Patient is not afflicted from Stress Incontinence and Urge Incontinence  Safety: reviewed. Patient wears a seat belt, has smoke detectors, has carbon monoxide detectors, practices appropriate gun safety, and wears sunscreen with extended sun exposure. Dental Care: biannual cleanings, brushes and flosses daily. Ophthalmology/Optometry: Annual visit.  Hearing loss: none Vision impairments: none  Pt was started on mounjaro 0.25 mg weekly for diabetes and weight loss. Pt is tolerating medicine. Has noted a decrease in appetitie, but no decrease in weight by our scales. Tries to eat healthy. No exercise.   [place] Office Visit from 03/05/2021 in [place]  PHQ-2 Total Score 0               Social Hx   Social History   Socioeconomic History   Marital status: Married    Spouse name: Not on file   Number of children: 1   Years of education: Not on file   Highest education level: Not on file  Occupational History   Not on file  Tobacco Use   Smoking status: Former    Packs/day: 3.00    Years: 12.00    Pack years: 36.00    Types: Cigarettes    Quit date: 2007    Years since quitting: 15.7   Smokeless tobacco: Never  Vaping Use   Vaping Use:  Former  Substance and Sexual Activity   Alcohol use: Yes    Alcohol/week: 2.0 standard drinks    Types: 2 Cans of beer per week   Drug use: Not Currently   Sexual activity: Yes  Other Topics Concern   Not on file  Social History Narrative   Not on file   Social Determinants of Health   Financial Resource Strain: Not on file  Food Insecurity: Not on file  Transportation Needs: Not on file  Physical Activity: Not on file  Stress: Not on file  Social Connections: Not on file   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[place])    Diabetes ([place]) 07/2020   new diagnosis   Follicular lymphoma ([place]) 2011    recurrent/notes 01/11/2018   GERD (gastroesophageal reflux disease)    "food related" (01/11/2018)   History of gout    History of stomach ulcers 1990s   HTN (hypertension)    Neuropathy    Pneumonia    recurrent/notes 01/11/2018   PONV (postoperative nausea and vomiting)    Social anxiety disorder    Past Surgical History:  Procedure Laterality Date   AXILLARY LYMPH NODE BIOPSY Left 2011   CARPAL TUNNEL RELEASE Right    KNEE ARTHROSCOPY Bilateral    LUMBAR  PUNCTURE  12/2017    post LP HA and found to have csf leak requiring blood patch/notes 01/02/2018   PORTA CATH INSERTION Left 2011   TONSILLECTOMY      Family History  Problem Relation Age of Onset   Hypertension Mother    Leukemia Mother    Diabetes Mellitus II Mother    Leukemia Father    Diabetes type II Father     Review of Systems  Constitutional:  Negative for chills and fever.  HENT:  Positive for congestion. Negative for rhinorrhea and sore throat.   Respiratory:  Negative for cough and shortness of breath.   Cardiovascular:  Negative for chest pain and palpitations.  Gastrointestinal:  Negative for abdominal pain, constipation, diarrhea, nausea and vomiting.  Genitourinary:  Negative for dysuria and  urgency.  Musculoskeletal:  Positive for back pain. Negative for arthralgias and myalgias.  Neurological:  Negative for dizziness and headaches.  Psychiatric/Behavioral:  Negative for dysphoric mood. The patient is not nervous/anxious.     Objective:  BP 106/70   Pulse 84   Temp (!) 97.4 F (36.3 C)   Resp 16   Ht 6' 3"  (1.905 m)   Wt 297 lb (134.7 kg)   BMI 37.12 kg/m   BP/Weight 04/02/2021 03/08/8545 08/11/348  Systolic BP 093 818 299  Diastolic BP 70 76 81  Wt. (Lbs) 297 296 293.1  BMI 37.12 37 36.63    Physical Exam Vitals reviewed.  Constitutional:      Appearance: Normal appearance. He is obese.  HENT:     Right Ear: Tympanic membrane, ear canal and external ear normal.     Left Ear: Tympanic membrane, ear canal and external ear normal.     Nose: Nose normal. No congestion or rhinorrhea.     Mouth/Throat:     Mouth: Mucous membranes are moist.     Pharynx: No oropharyngeal exudate or posterior oropharyngeal erythema.  Neck:     Vascular: No carotid bruit.  Cardiovascular:     Rate and Rhythm: Normal rate and regular rhythm.     Heart sounds: Normal heart sounds.  Pulmonary:     Effort: Pulmonary effort is normal. No respiratory distress.     Breath sounds: Normal breath sounds. No wheezing, rhonchi or rales.  Abdominal:     General: Bowel sounds are normal.     Palpations: Abdomen is soft.     Tenderness: There is no abdominal tenderness.  Lymphadenopathy:     Cervical: No cervical adenopathy.  Skin:    Findings: No lesion.  Neurological:     Mental Status: He is alert.  Psychiatric:        Mood and Affect: Mood normal.        Behavior: Behavior normal.    Lab Results  Component Value Date   WBC 5.8 03/05/2021   HGB 15.8 03/05/2021   HCT 46.0 03/05/2021   PLT 222 03/05/2021   GLUCOSE 98 03/05/2021   CHOL 215 (H) 03/05/2021   TRIG 225 (H) 03/05/2021   HDL 30 (L) 03/05/2021   LDLCALC 144 (H) 03/05/2021   ALT 72 (H) 03/05/2021   AST 41 (H)  03/05/2021   NA 140 03/05/2021   K 4.5 03/05/2021   CL 99 03/05/2021   CREATININE 1.36 (H) 03/05/2021   BUN 13 03/05/2021   CO2 27 03/05/2021   HGBA1C 6.1 (H) 03/05/2021   MICROALBUR 10 11/03/2020      Assessment & Plan:  1. Well adult exam 2.  Class 2 severe obesity due to excess calories with serious comorbidity and body mass index (BMI) of 37.0 to 37.9 in adult [facility])   Body mass index is 37.12 kg/m.   These are the goals we discussed:  Goals   Weight loss. Increase mounjaro to 0.5 mg once weekly for diabetes control and weight loss. Work on [employer] and increasing exercise.       This is a list of the screening recommended for you and due dates:  Health Maintenance  Topic Date Due   Flu Shot  10/02/2021*   Tetanus Vaccine  04/01/2030   HIV Screening  Completed   HPV Vaccine  Aged Out   COVID-19 Vaccine  Discontinued   Hepatitis C Screening: USPSTF Recommendation to screen - Ages 10-79 yo.  Discontinued  *Topic was postponed. The date shown is not the original due date.     AN INDIVIDUALIZED CARE PLAN: was established or reinforced today.   SELF MANAGEMENT: The patient and I together assessed ways to personally work towards obtaining the recommended goals  Support needs The patient and/or family needs were assessed and services were offered if appropriate.  Meds ordered this encounter  Medications   tirzepatide (MOUNJARO) 5 MG/0.5ML Pen    Sig: Inject 5 mg into the skin once a week.    Dispense:  6 mL    Refill:  0     Follow-up: Return in about 2 months (around 06/08/2021) for chronic fasting.  An After Visit Summary was printed and given to the patient.  Rochel Brome, MD [facility] [phone]

## 2021-04-04 ENCOUNTER — Encounter: Payer: Self-pay | Admitting: Family Medicine

## 2021-05-05 NOTE — Progress Notes (Signed)
[facility]  [address] [place],  [place]  [address] [phone]  Clinic Day:  05/11/2021  Referring physician: Rochel Brome, MD  This document serves as a record of services personally performed by Marice Potter, MD. It was created on their behalf by [facility], a trained medical scribe. The creation of this record is based on the scribe's personal observations and the provider's statements to them.  HISTORY OF PRESENT ILLNESS:  The patient is a 45 y.o. male with  recurrent follicular lymphoma.  From September 2015 to June 1601, his follicular lymphoma was kept under ideal control with Revlimid/Rituxan.  The patient had a cryptococcal infection which led to his Revlimid/Rituxan being held, which has been the case since June 2019.  Fortunately, all physical exams, labs, and scans done since then have not shown any evidence of recurrent lymphoma.  The patient comes in today for routine follow up.  Since his last visit, the patient has been doing well.  He denies having any B symptoms or lymphadenopathy which concerns him for his follicular lymphoma returning despite being off any form of therapy for over 3 years.  PHYSICAL EXAM:  Blood pressure 110/64, pulse 81, temperature 97.6 F (36.4 C), temperature source Oral, resp. rate 20, height 6' 3"  (1.905 m), weight 282 lb (127.9 kg), SpO2 96 %. Wt Readings from Last 3 Encounters:  05/11/21 282 lb (127.9 kg)  04/02/21 297 lb (134.7 kg)  03/05/21 296 lb (134.3 kg)   Body mass index is 35.25 kg/m. Performance status (ECOG): 0 Physical Exam Constitutional:      Appearance: Normal appearance. He is not ill-appearing.  HENT:     Mouth/Throat:     Mouth: Mucous membranes are moist.     Pharynx: Oropharynx is clear. No oropharyngeal exudate or posterior oropharyngeal erythema.  Cardiovascular:     Rate and Rhythm: Normal rate and regular rhythm.     Heart sounds: No murmur heard.   No friction rub.  No gallop.  Pulmonary:     Effort: Pulmonary effort is normal. No respiratory distress.     Breath sounds: Normal breath sounds. No wheezing, rhonchi or rales.  Abdominal:     General: Bowel sounds are normal. There is no distension.     Palpations: Abdomen is soft. There is no mass.     Tenderness: There is no abdominal tenderness.  Musculoskeletal:        General: No swelling.     Right lower leg: No edema.     Left lower leg: No edema.  Lymphadenopathy:     Cervical: No cervical adenopathy.     Upper Body:     Right upper body: No supraclavicular or axillary adenopathy.     Left upper body: No supraclavicular or axillary adenopathy.     Lower Body: No right inguinal adenopathy. No left inguinal adenopathy.  Skin:    General: Skin is warm.     Coloration: Skin is not jaundiced.     Findings: No lesion or rash.  Neurological:     General: No focal deficit present.     Mental Status: He is alert and oriented to person, place, and time. Mental status is at baseline.  Psychiatric:        Mood and Affect: Mood normal.        Behavior: Behavior normal.        Thought Content: Thought content normal.   LABS:   CBC Latest Ref Rng &  Units 05/11/2021 03/05/2021 11/03/2020  WBC - 5.8 5.8 5.0  Hemoglobin 13.5 - 17.5 15.5 15.8 16.1  Hematocrit 41 - 53 46 46.0 47.4  Platelets 150 - 399 172 222 201   CMP Latest Ref Rng & Units 05/11/2021 03/05/2021 11/03/2020  Glucose 65 - 99 mg/dL - 98 111(H)  BUN 4 - 21 21 13 14   Creatinine 0.6 - 1.3 1.5(A) 1.36(H) 1.21  Sodium 137 - 147 138 140 140  Potassium 3.4 - 5.3 3.6 4.5 4.3  Chloride 99 - 108 103 99 100  CO2 13 - 22 26(A) 27 26  Calcium 8.7 - 10.7 9.2 10.4(H) 10.0  Total Protein 6.0 - 8.5 g/dL - 6.4 6.5  Total Bilirubin 0.0 - 1.2 mg/dL - 0.8 0.9  Alkaline Phos 25 - 125 55 75 71  AST 14 - 40 37 41(H) 83(H)  ALT 10 - 40 55(A) 72(H) 133(H)    ASSESSMENT & PLAN:  Assessment/Plan:  A 46 y.o. male with a history of recurrent follicular lymphoma.   Based upon his labs and physical exam the patient remains disease-free.  Clinically, he continues to do very well.  As that is the case, I will see him back in 6 months for repeat clinical assessment.  CT scans will be done a day before his next visit to ensure there remains no radiographic evidence of disease recurrence.  The patient understands all the plans discussed today and is in agreement with them.    I, Rita Ohara, am acting as scribe for Marice Potter, MD    I have reviewed this report as typed by the medical scribe, and it is complete and accurate.  Dequincy Macarthur Critchley, MD

## 2021-05-11 ENCOUNTER — Inpatient Hospital Stay: Payer: Medicare HMO

## 2021-05-11 ENCOUNTER — Encounter: Payer: Self-pay | Admitting: Oncology

## 2021-05-11 ENCOUNTER — Other Ambulatory Visit: Payer: Self-pay | Admitting: Oncology

## 2021-05-11 ENCOUNTER — Inpatient Hospital Stay: Payer: Medicare HMO | Attending: Oncology | Admitting: Oncology

## 2021-05-11 VITALS — BP 110/64 | HR 81 | Temp 97.6°F | Resp 20 | Ht 75.0 in | Wt 282.0 lb

## 2021-05-11 DIAGNOSIS — C8298 Follicular lymphoma, unspecified, lymph nodes of multiple sites: Secondary | ICD-10-CM | POA: Diagnosis not present

## 2021-05-11 DIAGNOSIS — D649 Anemia, unspecified: Secondary | ICD-10-CM | POA: Diagnosis not present

## 2021-05-11 DIAGNOSIS — C8228 Follicular lymphoma grade III, unspecified, lymph nodes of multiple sites: Secondary | ICD-10-CM

## 2021-05-11 DIAGNOSIS — C8598 Non-Hodgkin lymphoma, unspecified, lymph nodes of multiple sites: Secondary | ICD-10-CM | POA: Insufficient documentation

## 2021-05-11 DIAGNOSIS — C8290 Follicular lymphoma, unspecified, unspecified site: Secondary | ICD-10-CM | POA: Diagnosis not present

## 2021-05-11 LAB — BASIC METABOLIC PANEL
BUN: 21 (ref 4–21)
CO2: 26 — AB (ref 13–22)
Chloride: 103 (ref 99–108)
Creatinine: 1.5 — AB (ref 0.6–1.3)
Glucose: 86
Potassium: 3.6 (ref 3.4–5.3)
Sodium: 138 (ref 137–147)

## 2021-05-11 LAB — HEPATIC FUNCTION PANEL
ALT: 55 — AB (ref 10–40)
AST: 37 (ref 14–40)
Alkaline Phosphatase: 55 (ref 25–125)
Bilirubin, Total: 1

## 2021-05-11 LAB — COMPREHENSIVE METABOLIC PANEL
Albumin: 4 (ref 3.5–5.0)
Calcium: 9.2 (ref 8.7–10.7)

## 2021-05-11 LAB — CBC: RBC: 4.92 (ref 3.87–5.11)

## 2021-05-11 LAB — CBC AND DIFFERENTIAL
HCT: 46 (ref 41–53)
Hemoglobin: 15.5 (ref 13.5–17.5)
Neutrophils Absolute: 3.13
Platelets: 172 (ref 150–399)
WBC: 5.8

## 2021-05-11 LAB — LACTATE DEHYDROGENASE: LDH: 135 U/L (ref 98–192)

## 2021-05-12 ENCOUNTER — Other Ambulatory Visit: Payer: Self-pay

## 2021-05-12 ENCOUNTER — Ambulatory Visit: Payer: Medicare HMO

## 2021-05-12 LAB — HM DIABETES EYE EXAM

## 2021-05-18 ENCOUNTER — Encounter: Payer: Self-pay | Admitting: Family Medicine

## 2021-06-09 ENCOUNTER — Other Ambulatory Visit: Payer: Self-pay | Admitting: Family Medicine

## 2021-06-09 DIAGNOSIS — M109 Gout, unspecified: Secondary | ICD-10-CM

## 2021-06-10 NOTE — Progress Notes (Signed)
Cancelled.  

## 2021-06-11 ENCOUNTER — Ambulatory Visit (INDEPENDENT_AMBULATORY_CARE_PROVIDER_SITE_OTHER): Payer: Medicare HMO | Admitting: Family Medicine

## 2021-06-11 DIAGNOSIS — E782 Mixed hyperlipidemia: Secondary | ICD-10-CM

## 2021-06-11 DIAGNOSIS — I10 Essential (primary) hypertension: Secondary | ICD-10-CM

## 2021-06-11 DIAGNOSIS — E118 Type 2 diabetes mellitus with unspecified complications: Secondary | ICD-10-CM

## 2021-06-11 DIAGNOSIS — M109 Gout, unspecified: Secondary | ICD-10-CM

## 2021-06-11 DIAGNOSIS — C8228 Follicular lymphoma grade III, unspecified, lymph nodes of multiple sites: Secondary | ICD-10-CM

## 2021-06-15 DIAGNOSIS — J019 Acute sinusitis, unspecified: Secondary | ICD-10-CM | POA: Diagnosis not present

## 2021-06-15 DIAGNOSIS — B9689 Other specified bacterial agents as the cause of diseases classified elsewhere: Secondary | ICD-10-CM | POA: Diagnosis not present

## 2021-06-30 ENCOUNTER — Other Ambulatory Visit: Payer: Self-pay

## 2021-06-30 MED ORDER — TIRZEPATIDE 5 MG/0.5ML ~~LOC~~ SOAJ: 5.0000 mg | SUBCUTANEOUS | 0 refills | Status: DC

## 2021-06-30 NOTE — Telephone Encounter (Signed)
Would like call back with response.   Royce Macadamia, [place] 06/30/21 11:45 AM

## 2021-07-07 DIAGNOSIS — B9689 Other specified bacterial agents as the cause of diseases classified elsewhere: Secondary | ICD-10-CM | POA: Diagnosis not present

## 2021-07-07 DIAGNOSIS — J988 Other specified respiratory disorders: Secondary | ICD-10-CM | POA: Diagnosis not present

## 2021-07-07 DIAGNOSIS — J019 Acute sinusitis, unspecified: Secondary | ICD-10-CM | POA: Diagnosis not present

## 2021-07-14 DIAGNOSIS — M109 Gout, unspecified: Secondary | ICD-10-CM | POA: Insufficient documentation

## 2021-07-14 DIAGNOSIS — K7581 Nonalcoholic steatohepatitis (NASH): Secondary | ICD-10-CM | POA: Insufficient documentation

## 2021-07-14 DIAGNOSIS — E118 Type 2 diabetes mellitus with unspecified complications: Secondary | ICD-10-CM | POA: Insufficient documentation

## 2021-07-14 DIAGNOSIS — E782 Mixed hyperlipidemia: Secondary | ICD-10-CM | POA: Insufficient documentation

## 2021-07-14 HISTORY — DX: Gout, unspecified: M10.9

## 2021-07-15 ENCOUNTER — Ambulatory Visit (INDEPENDENT_AMBULATORY_CARE_PROVIDER_SITE_OTHER): Payer: Medicare HMO | Admitting: Family Medicine

## 2021-07-15 ENCOUNTER — Other Ambulatory Visit: Payer: Self-pay

## 2021-07-15 ENCOUNTER — Encounter: Payer: Self-pay | Admitting: Family Medicine

## 2021-07-15 VITALS — BP 112/68 | HR 71 | Temp 96.8°F | Ht 75.0 in | Wt 277.0 lb

## 2021-07-15 DIAGNOSIS — E118 Type 2 diabetes mellitus with unspecified complications: Secondary | ICD-10-CM

## 2021-07-15 DIAGNOSIS — E782 Mixed hyperlipidemia: Secondary | ICD-10-CM | POA: Diagnosis not present

## 2021-07-15 DIAGNOSIS — I10 Essential (primary) hypertension: Secondary | ICD-10-CM

## 2021-07-15 DIAGNOSIS — G629 Polyneuropathy, unspecified: Secondary | ICD-10-CM | POA: Diagnosis not present

## 2021-07-15 DIAGNOSIS — M109 Gout, unspecified: Secondary | ICD-10-CM | POA: Diagnosis not present

## 2021-07-15 DIAGNOSIS — K7581 Nonalcoholic steatohepatitis (NASH): Secondary | ICD-10-CM

## 2021-07-15 DIAGNOSIS — C8298 Follicular lymphoma, unspecified, lymph nodes of multiple sites: Secondary | ICD-10-CM

## 2021-07-15 LAB — POCT UA - MICROALBUMIN: Microalbumin Ur, POC: 30 mg/L

## 2021-07-15 NOTE — Patient Instructions (Addendum)
Consider increasing mounjaro 7.5 mg weekly to help with weight loss.  Recommend continue to work on eating healthy diet and increase exercise. Recommend get Eye exam.  .

## 2021-07-15 NOTE — Progress Notes (Signed)
Subjective:  Patient ID: Michael Woods, male    DOB: 1975/12/10  Age: 46 y.o. MRN: [id number]  Chief Complaint  Patient presents with   Diabetes   Hypertension   Hyperlipidemia    HPI  Diabetes:  Complications: HTN Most recent A1C: 6.1 Current medications: Mounjaro 5 mg weekly. Helps to suppress his appetite.  Last Eye Exam:  Foot checks: Yes  Neuropathy in BL feet taking Lyrica.  Hyperlipidemia: Current medications: Zetia and Vascepa  Hypertension: Current medications: Benicar 40-25 mg daily  Diet: Eating healthy (trying) Exercise: Does not exercise regularly  Current Outpatient Medications on File Prior to Visit  Medication Sig Dispense Refill   acetaminophen (TYLENOL) 325 MG tablet Take 2 tablets (650 mg total) by mouth every 6 (six) hours as needed for mild pain (or Fever >/= 101). 12 tablet 0   allopurinol (ZYLOPRIM) 100 MG tablet Take 1 tablet (100 mg total) by mouth daily. 30 tablet 2   ezetimibe (ZETIA) 10 MG tablet Take 1 tablet (10 mg total) by mouth daily. 90 tablet 1   fluticasone (FLONASE) 50 MCG/ACT nasal spray Place 2 sprays into both nostrils daily.      ibuprofen (ADVIL) 800 MG tablet Take 1 tablet (800 mg total) by mouth every 8 (eight) hours as needed. 40 tablet 1   icosapent Ethyl (VASCEPA) 1 g capsule Take 2 capsules (2 g total) by mouth 2 (two) times daily. 360 capsule 1   loratadine (CLARITIN) 10 MG tablet Take 10 mg by mouth daily.     MITIGARE 0.6 MG CAPS TAKE ONE CAPSULE BY MOUTH TWICE DAILY FOR 7 DAYS FOR GOUT flare 14 capsule 2   olmesartan-hydrochlorothiazide (BENICAR HCT) 40-25 MG tablet Take 1 tablet by mouth daily. 90 tablet 1   pregabalin (LYRICA) 150 MG capsule Take 1 capsule (150 mg total) by mouth 2 (two) times daily. 180 capsule 3   tirzepatide (MOUNJARO) 5 MG/0.5ML Pen Inject 5 mg into the skin once a week. 6 mL 0   VENTOLIN HFA 108 (90 Base) MCG/ACT inhaler      Current Facility-Administered Medications on File Prior to Visit   Medication Dose Route Frequency Provider Last Rate Last Admin   heparin lock flush 100 unit/mL  500 Units Intravenous Once Lewis, Dequincy A, MD       heparin lock flush 100 unit/mL  500 Units Intravenous Once Marice Potter, MD       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[place])    Diabetes ([place]) 07/2020   new diagnosis   Follicular lymphoma ([place]) 2011    recurrent/notes 01/11/2018   GERD (gastroesophageal reflux disease)    "food related" (01/11/2018)   History of gout    History of stomach ulcers 1990s   HTN (hypertension)    Neuropathy    Pneumonia    recurrent/notes 01/11/2018   PONV (postoperative nausea and vomiting)    Social anxiety disorder    Past Surgical History:  Procedure Laterality Date   AXILLARY LYMPH NODE BIOPSY Left 2011   CARPAL TUNNEL RELEASE Right    KNEE ARTHROSCOPY Bilateral    LUMBAR PUNCTURE  12/2017    post LP HA and found to have csf leak requiring blood patch/notes 01/02/2018   PORTA CATH INSERTION Left 2011   TONSILLECTOMY      Family History  Problem Relation Age of Onset   Hypertension Mother  Leukemia Mother    Diabetes Mellitus II Mother    Leukemia Father    Diabetes type II Father    Social History   Socioeconomic History   Marital status: Married    Spouse name: Not on file   Number of children: 1   Years of education: Not on file   Highest education level: Not on file  Occupational History   Not on file  Tobacco Use   Smoking status: Former    Packs/day: 3.00    Years: 12.00    Pack years: 36.00    Types: Cigarettes    Quit date: 2007    Years since quitting: 16.0   Smokeless tobacco: Never  Vaping Use   Vaping Use: Former  Substance and Sexual Activity   Alcohol use: Yes    Alcohol/week: 2.0 standard drinks    Types: 2 Cans of beer per week   Drug use: Not Currently   Sexual activity: Yes  Other Topics  Concern   Not on file  Social History Narrative   Not on file   Social Determinants of Health   Financial Resource Strain: Not on file  Food Insecurity: Not on file  Transportation Needs: Not on file  Physical Activity: Not on file  Stress: Not on file  Social Connections: Not on file    Review of Systems  Constitutional:  Negative for chills, diaphoresis, fatigue and fever.  HENT:  Negative for congestion, ear pain and sore throat.   Respiratory:  Negative for cough and shortness of breath.   Cardiovascular:  Negative for chest pain and leg swelling.  Gastrointestinal:  Negative for abdominal pain, constipation, diarrhea, nausea and vomiting.  Genitourinary:  Negative for dysuria and urgency.  Musculoskeletal:  Positive for arthralgias, back pain and myalgias.  Neurological:  Negative for dizziness and headaches.  Psychiatric/Behavioral:  Negative for dysphoric mood.     Objective:  BP 112/68    Pulse 71    Temp (!) 96.8 F (36 C)    Ht 6\' 3"  (1.905 m)    Wt 277 lb (125.6 kg)    SpO2 99%    BMI 34.62 kg/m   BP/Weight 07/15/2021 05/11/2021 7/90/2409  Systolic BP 735 329 924  Diastolic BP 68 64 70  Wt. (Lbs) 277 282 297  BMI 34.62 35.25 37.12    Physical Exam Vitals reviewed.  Constitutional:      Appearance: Normal appearance. He is obese.  Neck:     Vascular: No carotid bruit.  Cardiovascular:     Rate and Rhythm: Normal rate and regular rhythm.     Pulses: Normal pulses.     Heart sounds: Normal heart sounds.  Pulmonary:     Effort: Pulmonary effort is normal.     Breath sounds: Normal breath sounds. No wheezing, rhonchi or rales.  Abdominal:     General: Bowel sounds are normal.     Palpations: Abdomen is soft.     Tenderness: There is no abdominal tenderness.  Skin:    General: Skin is warm and dry.  Neurological:     Mental Status: He is alert.  Psychiatric:        Mood and Affect: Mood normal.        Behavior: Behavior normal.    Diabetic Foot Exam  - Simple   Simple Foot Form Diabetic Foot exam was performed with the following findings: Yes 07/15/2021 11:04 AM  Visual Inspection No deformities, no ulcerations, no other skin breakdown bilaterally: Yes  Sensation Testing See comments: Yes Pulse Check Posterior Tibialis and Dorsalis pulse intact bilaterally: Yes Comments Decreased sensation BL.       Lab Results  Component Value Date   WBC 11.4 (H) 07/15/2021   HGB 16.8 07/15/2021   HCT 48.7 07/15/2021   PLT 250 07/15/2021   GLUCOSE 80 07/15/2021   CHOL 215 (H) 07/15/2021   TRIG 149 07/15/2021   HDL 46 07/15/2021   LDLCALC 142 (H) 07/15/2021   ALT 47 (H) 07/15/2021   AST 24 07/15/2021   NA 141 07/15/2021   K 4.3 07/15/2021   CL 98 07/15/2021   CREATININE 1.43 (H) 07/15/2021   BUN 20 07/15/2021   CO2 28 07/15/2021   HGBA1C 5.8 (H) 07/15/2021   MICROALBUR 30 07/15/2021      Assessment & Plan:   Problem List Items Addressed This Visit       Cardiovascular and Mediastinum   Benign essential hypertension - Primary    Well controlled.  No changes to medicines. Cotiue benicar/hct 40/25 mg daily. Continue to work on eating a healthy diet and exercise.  Labs drawn today.        Relevant Orders   Comprehensive metabolic panel (Completed)   CBC with Differential/Platelet (Completed)     Digestive   NASH (nonalcoholic steatohepatitis)    Stable.        Endocrine   Controlled type 2 diabetes mellitus with complication, without long-term current use of insulin (HCC)    Control: greatly improved.  Recommend check feet daily. Recommend annual eye exams. Medicines: Continue mounjaro 5 mg weekly.  Continue to work on eating a healthy diet and exercise.  Labs drawn today.         Relevant Orders   Hemoglobin A1c (Completed)   POCT UA - Microalbumin (Completed)     Nervous and Auditory   Neuropathy    Fair control. Continue lyrica.         Other   Follicular lymphoma ([place]) (Chronic)    Management  per specialist.  Send results to Dr. Bobby Rumpf.      Gout    Continue allopurinol. The current medical regimen is effective;  continue present plan and medications.       Mixed hyperlipidemia    Not at goal.  Recommend continue to work on eating healthy diet and exercise. Labs came back as follows: Trigs greatly improved. LDL still high. HDL improved. Recommend crestor 10 mg once daily at night.       Relevant Orders   Lipid panel (Completed)  .  No orders of the defined types were placed in this encounter.   Orders Placed This Encounter  Procedures   Comprehensive metabolic panel   Hemoglobin A1c   Lipid panel   CBC with Differential/Platelet   POCT UA - Microalbumin     Follow-up: Return in about 3 months (around 10/13/2021) for chronic fasting.  An After Visit Summary was printed and given to the patient.  Rochel Brome, MD [facility] [phone]

## 2021-07-16 LAB — CBC WITH DIFFERENTIAL/PLATELET
Basophils Absolute: 0.1 10*3/uL (ref 0.0–0.2)
Basos: 1 %
EOS (ABSOLUTE): 0.1 10*3/uL (ref 0.0–0.4)
Eos: 1 %
Hematocrit: 48.7 % (ref 37.5–51.0)
Hemoglobin: 16.8 g/dL (ref 13.0–17.7)
Immature Grans (Abs): 0 10*3/uL (ref 0.0–0.1)
Immature Granulocytes: 0 %
Lymphocytes Absolute: 3.8 10*3/uL — ABNORMAL HIGH (ref 0.7–3.1)
Lymphs: 33 %
MCH: 31.1 pg (ref 26.6–33.0)
MCHC: 34.5 g/dL (ref 31.5–35.7)
MCV: 90 fL (ref 79–97)
Monocytes Absolute: 0.9 10*3/uL (ref 0.1–0.9)
Monocytes: 8 %
Neutrophils Absolute: 6.5 10*3/uL (ref 1.4–7.0)
Neutrophils: 57 %
Platelets: 250 10*3/uL (ref 150–450)
RBC: 5.41 x10E6/uL (ref 4.14–5.80)
RDW: 14 % (ref 11.6–15.4)
WBC: 11.4 10*3/uL — ABNORMAL HIGH (ref 3.4–10.8)

## 2021-07-16 LAB — LIPID PANEL
Chol/HDL Ratio: 4.7 ratio (ref 0.0–5.0)
Cholesterol, Total: 215 mg/dL — ABNORMAL HIGH (ref 100–199)
HDL: 46 mg/dL (ref >39)
LDL Chol Calc (NIH): 142 mg/dL — ABNORMAL HIGH (ref 0–99)
Triglycerides: 149 mg/dL (ref 0–149)
VLDL Cholesterol Cal: 27 mg/dL (ref 5–40)

## 2021-07-16 LAB — COMPREHENSIVE METABOLIC PANEL
ALT: 47 IU/L — ABNORMAL HIGH (ref 0–44)
AST: 24 IU/L (ref 0–40)
Albumin/Globulin Ratio: 2.6 — ABNORMAL HIGH (ref 1.2–2.2)
Albumin: 4.7 g/dL (ref 4.0–5.0)
Alkaline Phosphatase: 69 IU/L (ref 44–121)
BUN/Creatinine Ratio: 14 (ref 9–20)
BUN: 20 mg/dL (ref 6–24)
Bilirubin Total: 0.8 mg/dL (ref 0.0–1.2)
CO2: 28 mmol/L (ref 20–29)
Calcium: 11.4 mg/dL — ABNORMAL HIGH (ref 8.7–10.2)
Chloride: 98 mmol/L (ref 96–106)
Creatinine, Ser: 1.43 mg/dL — ABNORMAL HIGH (ref 0.76–1.27)
Globulin, Total: 1.8 g/dL (ref 1.5–4.5)
Glucose: 80 mg/dL (ref 70–99)
Potassium: 4.3 mmol/L (ref 3.5–5.2)
Sodium: 141 mmol/L (ref 134–144)
Total Protein: 6.5 g/dL (ref 6.0–8.5)
eGFR: 62 mL/min/{1.73_m2} (ref >59)

## 2021-07-16 LAB — HEMOGLOBIN A1C
Est. average glucose Bld gHb Est-mCnc: 120 mg/dL
Hgb A1c MFr Bld: 5.8 % — ABNORMAL HIGH (ref 4.8–5.6)

## 2021-07-19 NOTE — Progress Notes (Signed)
Blood count abnormal. Wbc elevated slightly. Ask if pt has been sick? Liver function normal.  Kidney function abnormal. Stable.  Calcium little high.  Cholesterol: trigs greatly improved. LDL still high. HDL improved. Recommend crestor 10 mg once daily at night.  HBA1C: 5.8. improved.  I will forward to Dr. Bobby Rumpf.

## 2021-07-27 NOTE — Assessment & Plan Note (Signed)
Continue allopurinol. The current medical regimen is effective;  continue present plan and medications.

## 2021-07-27 NOTE — Assessment & Plan Note (Signed)
Well controlled.  No changes to medicines. Cotiue benicar/hct 40/25 mg daily. Continue to work on eating a healthy diet and exercise.  Labs drawn today.

## 2021-07-27 NOTE — Assessment & Plan Note (Signed)
Management per specialist.  Send results to Dr. Bobby Rumpf.

## 2021-07-27 NOTE — Assessment & Plan Note (Signed)
Fair control. Continue lyrica.

## 2021-07-27 NOTE — Assessment & Plan Note (Signed)
Stable

## 2021-07-27 NOTE — Assessment & Plan Note (Signed)
Control: greatly improved.  Recommend check feet daily. Recommend annual eye exams. Medicines: Continue mounjaro 5 mg weekly.  Continue to work on eating a healthy diet and exercise.  Labs drawn today.

## 2021-07-27 NOTE — Assessment & Plan Note (Signed)
Not at goal.  Recommend continue to work on eating healthy diet and exercise. Labs came back as follows: Trigs greatly improved. LDL still high. HDL improved. Recommend crestor 10 mg once daily at night.

## 2021-08-06 ENCOUNTER — Other Ambulatory Visit: Payer: Self-pay | Admitting: Family Medicine

## 2021-09-07 ENCOUNTER — Other Ambulatory Visit: Payer: Self-pay

## 2021-09-07 MED ORDER — EZETIMIBE 10 MG PO TABS: 10.0000 mg | ORAL_TABLET | Freq: Every day | ORAL | 1 refills | Status: DC

## 2021-09-14 ENCOUNTER — Other Ambulatory Visit: Payer: Self-pay

## 2021-09-14 DIAGNOSIS — M109 Gout, unspecified: Secondary | ICD-10-CM

## 2021-09-14 MED ORDER — TIRZEPATIDE 5 MG/0.5ML ~~LOC~~ SOAJ
5.0000 mg | SUBCUTANEOUS | 0 refills | Status: DC
Start: 2021-09-14 — End: 2021-11-16

## 2021-09-14 MED ORDER — COLCHICINE 0.6 MG PO CAPS: ORAL_CAPSULE | ORAL | 2 refills | Status: DC

## 2021-10-16 ENCOUNTER — Ambulatory Visit: Payer: Medicare HMO | Admitting: Family Medicine

## 2021-11-06 DIAGNOSIS — C8290 Follicular lymphoma, unspecified, unspecified site: Secondary | ICD-10-CM | POA: Diagnosis not present

## 2021-11-06 DIAGNOSIS — D691 Qualitative platelet defects: Secondary | ICD-10-CM | POA: Diagnosis not present

## 2021-11-06 DIAGNOSIS — C8228 Follicular lymphoma grade III, unspecified, lymph nodes of multiple sites: Secondary | ICD-10-CM | POA: Diagnosis not present

## 2021-11-06 DIAGNOSIS — K820 Obstruction of gallbladder: Secondary | ICD-10-CM | POA: Diagnosis not present

## 2021-11-06 DIAGNOSIS — R918 Other nonspecific abnormal finding of lung field: Secondary | ICD-10-CM | POA: Diagnosis not present

## 2021-11-08 NOTE — Progress Notes (Signed)
?[facility]  ?[address] ?[place],  [place]  [address] ?[phone] ? ?Clinic Day:  11/09/2021 ? ?Referring physician: Rochel Brome, MD ? ?HISTORY OF PRESENT ILLNESS:  ?The patient is a 46 y.o. male with  recurrent follicular lymphoma.  From September 2015 to June 4081, his follicular lymphoma was kept under ideal control with Revlimid/Rituxan.  The patient had a cryptococcal infection which led to his Revlimid/Rituxan being held, which has been the case since June 2019.  Fortunately, all physical exams, labs, and scans done since then have not shown any evidence of recurrent lymphoma.  The patient comes in today for routine follow up.  Since his last visit, the patient has been doing well.  He denies having any B symptoms or bulky lymphadenopathy which concerns him for his follicular lymphoma returning despite being off any form of therapy for nearly 4 years ? ?PHYSICAL EXAM:  ?Blood pressure 120/77, pulse 82, temperature 98.6 ?F (37 ?C), resp. rate 18, height 6' 3"  (1.905 m), weight 269 lb 6.4 oz (122.2 kg), SpO2 97 %. ?Wt Readings from Last 3 Encounters:  ?11/09/21 269 lb 6.4 oz (122.2 kg)  ?07/15/21 277 lb (125.6 kg)  ?05/11/21 282 lb (127.9 kg)  ? ?Body mass index is 33.67 kg/m?Marland Kitchen ?Performance status (ECOG): 0 ?Physical Exam ?Constitutional:   ?   Appearance: Normal appearance. He is not ill-appearing.  ?HENT:  ?   Mouth/Throat:  ?   Mouth: Mucous membranes are moist.  ?   Pharynx: Oropharynx is clear. No oropharyngeal exudate or posterior oropharyngeal erythema.  ?Cardiovascular:  ?   Rate and Rhythm: Normal rate and regular rhythm.  ?   Heart sounds: No murmur heard. ?  No friction rub. No gallop.  ?Pulmonary:  ?   Effort: Pulmonary effort is normal. No respiratory distress.  ?   Breath sounds: Normal breath sounds. No wheezing, rhonchi or rales.  ?Abdominal:  ?   General: Bowel sounds are normal. There is no distension.  ?   Palpations: Abdomen is soft. There is no mass.   ?   Tenderness: There is no abdominal tenderness.  ?Musculoskeletal:     ?   General: No swelling.  ?   Right lower leg: No edema.  ?   Left lower leg: No edema.  ?Lymphadenopathy:  ?   Cervical: No cervical adenopathy.  ?   Upper Body:  ?   Right upper body: No supraclavicular or axillary adenopathy.  ?   Left upper body: No supraclavicular or axillary adenopathy.  ?   Lower Body: No right inguinal adenopathy. No left inguinal adenopathy.  ?Skin: ?   General: Skin is warm.  ?   Coloration: Skin is not jaundiced.  ?   Findings: No lesion or rash.  ?Neurological:  ?   General: No focal deficit present.  ?   Mental Status: He is alert and oriented to person, place, and time. Mental status is at baseline.  ?Psychiatric:     ?   Mood and Affect: Mood normal.     ?   Behavior: Behavior normal.     ?   Thought Content: Thought content normal.  ? ?SCANS:  CT scans of his chest/abdomen/pelvis revealed the following: ?FINDINGS: ?CT CHEST FINDINGS ? ?Cardiovascular: Left chest port catheter. Normal heart size. No ?pericardial effusion. ? ?Mediastinum/Nodes: No enlarged mediastinal, hilar, or axillary lymph ?nodes. Thyroid gland, trachea, and esophagus demonstrate no ?significant findings. ? ?Lungs/Pleura: Unchanged coarsely spiculated nodule of the  peripheral ?right apex measuring 2.2 x 1.3 cm (series 301, image 33). Unchanged ?subpleural nodules of the dependent left lower lobe measuring up to ?0.9 cm (series 301, image 70). No pleural effusion or pneumothorax. ? ?Musculoskeletal: No chest wall mass or suspicious osseous lesions ?identified. ? ?CT ABDOMEN PELVIS FINDINGS ? ?Hepatobiliary: No solid liver abnormality is seen. Contracted ?gallbladder. No gallstones, gallbladder wall thickening, or biliary ?dilatation. ? ?Pancreas: Unremarkable. No pancreatic ductal dilatation or ?surrounding inflammatory changes. ? ?Spleen: Normal in size without significant abnormality. ? ?Adrenals/Urinary Tract: Adrenal glands are  unremarkable. Kidneys are ?normal, without renal calculi, solid lesion, or hydronephrosis. ?Bladder is unremarkable. ? ?Stomach/Bowel: Stomach is within normal limits. Appendix appears ?normal. No evidence of bowel wall thickening, distention, or ?inflammatory changes. Cecal and sigmoid diverticula. ? ?Vascular/Lymphatic: No significant vascular findings are present. No ?enlarged abdominal or pelvic lymph nodes. ? ?Reproductive: No mass or other abnormality. ? ?Other: No abdominal wall hernia or abnormality. No ascites. ? ?Musculoskeletal: No acute osseous findings. ? ?IMPRESSION: ?1. No evidence of lymphadenopathy in the chest, abdomen, or pelvis. ?2. Unchanged bilateral pulmonary nodules. No new nodules. ? ? ? ?LABS:  ? ? ?  ?ASSESSMENT & PLAN:  ?Assessment/Plan:  A 46 y.o. male with a history of recurrent follicular lymphoma.  In clinic today, I went over his CT scan images with him, for which he could see there remains no radiographic evidence of disease recurrence.  Furthermore, his labs and physical exam suggest that he remains remains disease-free.  Clinically, he continues to do very well.  As that is the case, I will see him back in 6 months for repeat clinical assessment. The patient understands all the plans discussed today and is in agreement with them.   ? ? ?Iliya Spivack Macarthur Critchley, MD ? ? ? ?  ?

## 2021-11-09 ENCOUNTER — Inpatient Hospital Stay: Payer: Medicare HMO | Attending: Oncology | Admitting: Oncology

## 2021-11-09 ENCOUNTER — Other Ambulatory Visit: Payer: Self-pay | Admitting: Oncology

## 2021-11-09 VITALS — BP 120/77 | HR 82 | Temp 98.6°F | Resp 18 | Ht 75.0 in | Wt 269.4 lb

## 2021-11-09 DIAGNOSIS — C8208 Follicular lymphoma grade I, lymph nodes of multiple sites: Secondary | ICD-10-CM | POA: Diagnosis not present

## 2021-11-09 DIAGNOSIS — C8228 Follicular lymphoma grade III, unspecified, lymph nodes of multiple sites: Secondary | ICD-10-CM

## 2021-11-11 ENCOUNTER — Encounter: Payer: Self-pay | Admitting: Oncology

## 2021-11-12 ENCOUNTER — Other Ambulatory Visit: Payer: Self-pay | Admitting: Oncology

## 2021-11-16 ENCOUNTER — Encounter: Payer: Self-pay | Admitting: Family Medicine

## 2021-11-16 ENCOUNTER — Ambulatory Visit (INDEPENDENT_AMBULATORY_CARE_PROVIDER_SITE_OTHER): Payer: Medicare HMO | Admitting: Family Medicine

## 2021-11-16 VITALS — BP 98/62 | HR 74 | Temp 98.2°F | Ht 75.0 in | Wt 269.0 lb

## 2021-11-16 DIAGNOSIS — G629 Polyneuropathy, unspecified: Secondary | ICD-10-CM | POA: Diagnosis not present

## 2021-11-16 DIAGNOSIS — E782 Mixed hyperlipidemia: Secondary | ICD-10-CM | POA: Diagnosis not present

## 2021-11-16 DIAGNOSIS — K7581 Nonalcoholic steatohepatitis (NASH): Secondary | ICD-10-CM | POA: Diagnosis not present

## 2021-11-16 DIAGNOSIS — M109 Gout, unspecified: Secondary | ICD-10-CM | POA: Diagnosis not present

## 2021-11-16 DIAGNOSIS — I10 Essential (primary) hypertension: Secondary | ICD-10-CM | POA: Diagnosis not present

## 2021-11-16 DIAGNOSIS — C8298 Follicular lymphoma, unspecified, lymph nodes of multiple sites: Secondary | ICD-10-CM | POA: Diagnosis not present

## 2021-11-16 DIAGNOSIS — E118 Type 2 diabetes mellitus with unspecified complications: Secondary | ICD-10-CM

## 2021-11-16 MED ORDER — TIRZEPATIDE 7.5 MG/0.5ML ~~LOC~~ SOAJ: 7.5000 mg | SUBCUTANEOUS | 0 refills | Status: DC

## 2021-11-16 NOTE — Assessment & Plan Note (Addendum)
The current medical regimen is effective;  continue present plan and medications. Continue lyrica ?

## 2021-11-16 NOTE — Assessment & Plan Note (Addendum)
Stable. Check cmp.  ?

## 2021-11-16 NOTE — Assessment & Plan Note (Signed)
Management per specialist. 

## 2021-11-16 NOTE — Assessment & Plan Note (Addendum)
The current medical regimen is effective;  continue present plan and medications. ?Continue allopurinol 100 mg daily.  ?

## 2021-11-16 NOTE — Progress Notes (Signed)
? ?Subjective:  ?Patient ID: Michael Woods, male    DOB: 1976-05-29  Age: 46 y.o. MRN: [id number] ? ?Chief Complaint  ?Patient presents with  ? Hypertension  ? Hyperlipidemia  ? ? Diabetes:  ?Complications: HTN ?Patient does not check sugars daily. ?Most recent A1C: 5.8 ?Current medications: Mounjaro 5 mg weekly. Helps to suppress his appetite.  ?Last Eye Exam:  ?Foot checks: Yes  ?Neuropathy in BL feet taking Lyrica. ?  ?Hyperlipidemia: ?Current medications: Zetia and Vascepa ?  ?Hypertension: ?Current medications: Benicar 40-25 mg daily ?BP been running low. Been feeling a little dizzy from time to time.  ? ? ?Diet: Eating healthy (trying) ?Exercise: Patient does not exercise regularly ?  ?Current Outpatient Medications on File Prior to Visit  ?Medication Sig Dispense Refill  ? acetaminophen (TYLENOL) 325 MG tablet Take 2 tablets (650 mg total) by mouth every 6 (six) hours as needed for mild pain (or Fever >/= 101). 12 tablet 0  ? allopurinol (ZYLOPRIM) 100 MG tablet Take 1 tablet (100 mg total) by mouth daily. 30 tablet 2  ? Colchicine (MITIGARE) 0.6 MG CAPS TAKE ONE CAPSULE BY MOUTH TWICE DAILY FOR 7 DAYS FOR GOUT flare 14 capsule 2  ? ezetimibe (ZETIA) 10 MG tablet Take 1 tablet (10 mg total) by mouth daily. 90 tablet 1  ? fluticasone (FLONASE) 50 MCG/ACT nasal spray Place 2 sprays into both nostrils daily.     ? ibuprofen (ADVIL) 800 MG tablet Take 1 tablet (800 mg total) by mouth every 8 (eight) hours as needed. 40 tablet 1  ? icosapent Ethyl (VASCEPA) 1 g capsule Take 2 capsules (2 g total) by mouth 2 (two) times daily. 360 capsule 1  ? loratadine (CLARITIN) 10 MG tablet Take 10 mg by mouth daily.    ? olmesartan-hydrochlorothiazide (BENICAR HCT) 40-25 MG tablet TAKE ONE TABLET BY MOUTH ONCE DAILY 90 tablet 1  ? pregabalin (LYRICA) 150 MG capsule Take 1 capsule (150 mg total) by mouth 2 (two) times daily. 180 capsule 3  ? VENTOLIN HFA 108 (90 Base) MCG/ACT inhaler     ? ?Current Facility-Administered  Medications on File Prior to Visit  ?Medication Dose Route Frequency Provider Last Rate Last Admin  ? heparin lock flush 100 unit/mL  500 Units Intravenous Once Lewis, Dequincy A, MD      ? heparin lock flush 100 unit/mL  500 Units Intravenous Once Marice Potter, MD      ? ?Past Medical History:  ?Diagnosis Date  ? Chronic lower back pain   ? "pulled muscle; have bone spurs" (01/11/2018)  ? 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 Diabetes ([facility]) 07/2020  ? new diagnosis  ? Follicular lymphoma ([place] [facility] [place]) 2011  ?  recurrent/notes 01/11/2018  ? GERD (gastroesophageal reflux disease)   ? "food related" (01/11/2018)  ? History of gout   ? History of stomach ulcers 1990s  ? HTN (hypertension)   ? Neuropathy   ? Pneumonia   ? recurrent/notes 01/11/2018  ? PONV (postoperative nausea and vomiting)   ? Social anxiety disorder   ? ?Past Surgical History:  ?Procedure Laterality Date  ? AXILLARY LYMPH NODE BIOPSY Left 2011  ? CARPAL TUNNEL RELEASE Right   ? KNEE ARTHROSCOPY Bilateral   ? LUMBAR PUNCTURE  12/2017  ?  post LP HA and found to have csf leak requiring blood patch/notes 01/02/2018  ? PORTA CATH INSERTION Left 2011  ? TONSILLECTOMY    ?  ?Family History  ?Problem Relation Age of Onset  ?  Hypertension Mother   ? Leukemia Mother   ? Diabetes Mellitus II Mother   ? Leukemia Father   ? Diabetes type II Father   ? ?Social History  ? ?Socioeconomic History  ? Marital status: Married  ?  Spouse name: Not on file  ? Number of children: 1  ? Years of education: Not on file  ? Highest education level: Not on file  ?Occupational History  ? Not on file  ?Tobacco Use  ? Smoking status: Former  ?  Packs/day: 3.00  ?  Years: 12.00  ?  Pack years: 36.00  ?  Types: Cigarettes  ?  Quit date: 2007  ?  Years since quitting: 16.3  ? Smokeless tobacco: Never  ?Vaping Use  ? Vaping Use: Former  ?Substance and Sexual Activity  ? Alcohol use: Yes  ?  Alcohol/week: 2.0 standard drinks  ?  Types: 2 Cans of beer per week  ? Drug use: Not Currently  ? Sexual  activity: Yes  ?Other Topics Concern  ? Not on file  ?Social History Narrative  ? Not on file  ? ?Social Determinants of Health  ? ?Financial Resource Strain: Not on file  ?Food Insecurity: Not on file  ?Transportation Needs: Not on file  ?Physical Activity: Not on file  ?Stress: Not on file  ?Social Connections: Not on file  ? ? ?Review of Systems  ?Constitutional:  Negative for appetite change, fatigue and fever.  ?HENT:  Negative for congestion, ear pain, sinus pressure and sore throat.   ?Respiratory:  Negative for cough, chest tightness, shortness of breath and wheezing.   ?Cardiovascular:  Negative for chest pain and palpitations.  ?Gastrointestinal:  Negative for abdominal pain, constipation, diarrhea, nausea and vomiting.  ?Genitourinary:  Negative for dysuria and hematuria.  ?Musculoskeletal:  Negative for arthralgias, back pain, joint swelling and myalgias.  ?Skin:  Negative for rash.  ?Neurological:  Positive for dizziness. Negative for weakness and headaches.  ?Psychiatric/Behavioral:  Negative for dysphoric mood. The patient is not nervous/anxious.   ? ? ?Objective:  ?BP 98/62 (BP Location: Left Arm, Patient Position: Sitting)   Pulse 74   Temp 98.2 ?F (36.8 ?C) (Temporal)   Ht 6' 3"  (1.905 m)   Wt 269 lb (122 kg)   SpO2 96%   BMI 33.62 kg/m?  ? ? ?  11/16/2021  ?  8:43 AM 11/09/2021  ? 10:13 AM 07/15/2021  ? 10:30 AM  ?BP/Weight  ?Systolic BP 98 277 824  ?Diastolic BP 62 77 68  ?Wt. (Lbs) 269 269.4 277  ?BMI 33.62 kg/m2 33.67 kg/m2 34.62 kg/m2  ? ? ?Physical Exam ?Vitals reviewed.  ?Constitutional:   ?   Appearance: Normal appearance. He is normal weight.  ?Cardiovascular:  ?   Rate and Rhythm: Normal rate and regular rhythm.  ?   Pulses: Normal pulses.  ?   Heart sounds: Normal heart sounds.  ?Pulmonary:  ?   Effort: Pulmonary effort is normal.  ?   Breath sounds: Normal breath sounds.  ?Abdominal:  ?   General: Abdomen is flat. Bowel sounds are normal.  ?   Palpations: Abdomen is soft.   ?Neurological:  ?   Mental Status: He is alert and oriented to person, place, and time.  ?Psychiatric:     ?   Mood and Affect: Mood normal.     ?   Behavior: Behavior normal.  ? ? ?Diabetic Foot Exam - Simple   ?No data filed ?  ?  ? ?  Lab Results  ?Component Value Date  ? WBC 11.4 (H) 07/15/2021  ? HGB 16.8 07/15/2021  ? HCT 48.7 07/15/2021  ? PLT 250 07/15/2021  ? GLUCOSE 80 07/15/2021  ? CHOL 215 (H) 07/15/2021  ? TRIG 149 07/15/2021  ? HDL 46 07/15/2021  ? LDLCALC 142 (H) 07/15/2021  ? ALT 47 (H) 07/15/2021  ? AST 24 07/15/2021  ? NA 141 07/15/2021  ? K 4.3 07/15/2021  ? CL 98 07/15/2021  ? CREATININE 1.43 (H) 07/15/2021  ? BUN 20 07/15/2021  ? CO2 28 07/15/2021  ? HGBA1C 5.8 (H) 07/15/2021  ? MICROALBUR 30 07/15/2021  ? ? ? ? ?Assessment & Plan:  ? ?Problem List Items Addressed This Visit   ? ?  ? Cardiovascular and Mediastinum  ? Benign essential hypertension - Primary  ?  Decrease benicar/hct 40/25 mg 1/2 pill daily. ?Check BP daily.  ?Goal bp 110-130/78-85 ?  ?  ? Relevant Orders  ? CBC With Diff/Platelet  ? Comprehensive metabolic panel  ?  ? Digestive  ? NASH (nonalcoholic steatohepatitis)  ?  Stable. Check cmp.  ? ?  ?  ?  ? Endocrine  ? Controlled type 2 diabetes mellitus with complication, without long-term current use of insulin ([place])  ?  Control: Well controlled. ?Recommend check feet daily. ?Recommend annual eye exams. ?Medicines: Increase mounjaro to 7.5 mg once weekly. Hopefully this will help with his weight.  ?Continue to work on eating a healthy diet and exercise.  ?Labs drawn today.   ?  ?  ? Relevant Medications  ? tirzepatide (MOUNJARO) 7.5 MG/0.5ML Pen  ? Other Relevant Orders  ? Hemoglobin A1c  ?  ? Nervous and Auditory  ? Neuropathy  ?  The current medical regimen is effective;  continue present plan and medications. Continue lyrica ? ?  ?  ?  ? Other  ? Follicular lymphoma ([facility]) (Chronic)  ?  Management per specialist. ? ?  ?  ? Gout  ?  The current medical regimen is effective;   continue present plan and medications. ?Continue allopurinol 100 mg daily.  ? ?  ?  ? Mixed hyperlipidemia  ?  Improving but still not at goal. ?No changes to medicines.  ?Continue to work on eating a healthy diet and exercise.

## 2021-11-16 NOTE — Assessment & Plan Note (Signed)
Improving but still not at goal. ?No changes to medicines.  ?Continue to work on eating a healthy diet and exercise.  ?Labs drawn today.  ?

## 2021-11-16 NOTE — Assessment & Plan Note (Signed)
Decrease benicar/hct 40/25 mg 1/2 pill daily. ?Check BP daily.  ?Goal bp 110-130/78-85 ?

## 2021-11-16 NOTE — Assessment & Plan Note (Addendum)
Control: Well controlled. ?Recommend check feet daily. ?Recommend annual eye exams. ?Medicines: Increase mounjaro to 7.5 mg once weekly. Hopefully this will help with his weight.  ?Continue to work on eating a healthy diet and exercise.  ?Labs drawn today.   ?

## 2021-11-16 NOTE — Patient Instructions (Addendum)
Decrease benicar/hct 40/25 mg 1/2 pill daily. ?Check BP daily.  ?Goal bp 110-130/78-85 ? ? ?Increase mounjaro to 7.5 mg once weekly.  ?

## 2021-11-17 LAB — CBC WITH DIFF/PLATELET
Basophils Absolute: 0.1 10*3/uL (ref 0.0–0.2)
Basos: 1 %
EOS (ABSOLUTE): 0.1 10*3/uL (ref 0.0–0.4)
Eos: 1 %
Hematocrit: 49.1 % (ref 37.5–51.0)
Hemoglobin: 16.8 g/dL (ref 13.0–17.7)
Immature Grans (Abs): 0 10*3/uL (ref 0.0–0.1)
Immature Granulocytes: 0 %
Lymphocytes Absolute: 2 10*3/uL (ref 0.7–3.1)
Lymphs: 36 %
MCH: 30.9 pg (ref 26.6–33.0)
MCHC: 34.2 g/dL (ref 31.5–35.7)
MCV: 90 fL (ref 79–97)
Monocytes Absolute: 0.5 10*3/uL (ref 0.1–0.9)
Monocytes: 10 %
Neutrophils Absolute: 2.9 10*3/uL (ref 1.4–7.0)
Neutrophils: 52 %
Platelets: 215 10*3/uL (ref 150–450)
RBC: 5.44 x10E6/uL (ref 4.14–5.80)
RDW: 13.3 % (ref 11.6–15.4)
WBC: 5.5 10*3/uL (ref 3.4–10.8)

## 2021-11-17 LAB — HEMOGLOBIN A1C
Est. average glucose Bld gHb Est-mCnc: 111 mg/dL
Hgb A1c MFr Bld: 5.5 % (ref 4.8–5.6)

## 2021-11-17 LAB — COMPREHENSIVE METABOLIC PANEL
ALT: 41 IU/L (ref 0–44)
AST: 28 IU/L (ref 0–40)
Albumin/Globulin Ratio: 2.6 — ABNORMAL HIGH (ref 1.2–2.2)
Albumin: 4.7 g/dL (ref 4.0–5.0)
Alkaline Phosphatase: 75 IU/L (ref 44–121)
BUN/Creatinine Ratio: 11 (ref 9–20)
BUN: 15 mg/dL (ref 6–24)
Bilirubin Total: 0.5 mg/dL (ref 0.0–1.2)
CO2: 25 mmol/L (ref 20–29)
Calcium: 10.5 mg/dL — ABNORMAL HIGH (ref 8.7–10.2)
Chloride: 100 mmol/L (ref 96–106)
Creatinine, Ser: 1.37 mg/dL — ABNORMAL HIGH (ref 0.76–1.27)
Globulin, Total: 1.8 g/dL (ref 1.5–4.5)
Glucose: 82 mg/dL (ref 70–99)
Potassium: 4.7 mmol/L (ref 3.5–5.2)
Sodium: 140 mmol/L (ref 134–144)
Total Protein: 6.5 g/dL (ref 6.0–8.5)
eGFR: 65 mL/min/{1.73_m2} (ref >59)

## 2021-11-17 LAB — LIPID PANEL
Chol/HDL Ratio: 6.2 ratio — ABNORMAL HIGH (ref 0.0–5.0)
Cholesterol, Total: 179 mg/dL (ref 100–199)
HDL: 29 mg/dL — ABNORMAL LOW (ref >39)
LDL Chol Calc (NIH): 110 mg/dL — ABNORMAL HIGH (ref 0–99)
Triglycerides: 229 mg/dL — ABNORMAL HIGH (ref 0–149)
VLDL Cholesterol Cal: 40 mg/dL (ref 5–40)

## 2021-11-17 LAB — CARDIOVASCULAR RISK ASSESSMENT

## 2021-11-17 NOTE — Progress Notes (Signed)
Blood count normal.  ?Liver function normal.  ?Kidney function abnormal. Stable. ?Cholesterol: trigs elevated to 229. LDL 110 improved from 142. HDL decreased to 29.  ?Continue zetia 10 mg daily and vascepa 1 gm 2 capsules twice daily. Recommend fenofibrate 160 mg daily.  ?HBA1C: improved to 5.5. ?

## 2021-11-18 ENCOUNTER — Other Ambulatory Visit: Payer: Self-pay

## 2021-11-18 MED ORDER — FENOFIBRATE 160 MG PO TABS
160.0000 mg | ORAL_TABLET | Freq: Every day | ORAL | 3 refills | Status: DC
Start: 2021-11-18 — End: 2022-11-25

## 2021-11-18 NOTE — Telephone Encounter (Signed)
Refill sent to pharmacy.   

## 2021-12-28 ENCOUNTER — Other Ambulatory Visit: Payer: Self-pay | Admitting: Hematology and Oncology

## 2021-12-28 MED ORDER — PREGABALIN 150 MG PO CAPS: 150.0000 mg | ORAL_CAPSULE | Freq: Two times a day (BID) | ORAL | 3 refills | Status: DC

## 2022-01-11 ENCOUNTER — Other Ambulatory Visit: Payer: Self-pay | Admitting: Family Medicine

## 2022-02-24 ENCOUNTER — Encounter: Payer: Self-pay | Admitting: Pharmacist

## 2022-02-24 DIAGNOSIS — Z9189 Other specified personal risk factors, not elsewhere classified: Secondary | ICD-10-CM

## 2022-02-24 NOTE — Progress Notes (Signed)
Michael Woods)                                            Michael Woods                                        Statin Quality Measure Assessment    02/24/2022  Michael Woods Michael Woods Nov 23, 1975 [id number]    Per review of chart and payor information, patient has a diagnosis of diabetes but is not currently filling a statin prescription.  This places patient into the SUPD (Statin Use In Patients with Diabetes) measure for CMS.    No mention of statin intolerance. Upcoming appointment 02/25/22. If deemed therapeutically appropriate, patient could be evaluated tor statin therapy.  If he has statin intolerance, a statin exclusion code can be associated with the upcoming visit (which will remove the patient from the measure). ASCVD risk is low @ 4.2%  The 10-year ASCVD risk score (Arnett DK, et al., 2019) is: 4.2%   Values used to calculate the score:     Age: 46 years     Sex: Male     Is Non-Hispanic African American: No     Diabetic: Yes     Tobacco smoker: No     Systolic Blood Pressure: 98 mmHg     Is BP treated: Yes     HDL Cholesterol: 29 mg/dL     Total Cholesterol: 179 mg/dL 11/16/2021     Component Value Date/Time   CHOL 179 11/16/2021 0915   TRIG 229 (H) 11/16/2021 0915   HDL 29 (L) 11/16/2021 0915   CHOLHDL 6.2 (H) 11/16/2021 0915   LDLCALC 110 (H) 11/16/2021 0915    Please consider ONE of the following recommendations:  Initiate high intensity statin [name], #90, 3 refills   [name], #90, 3 refills    Initiate moderate intensity          statin with reduced frequency if prior          statin intolerance 1x weekly, #13, 3 refills   2x weekly, #26, 3 refills   3x weekly, #39, 3 refills    Code for past statin intolerance or  other exclusions (required annually)   Provider Requirements:  Associate code during an office visit or telehealth encounter  Drug  Induced Myopathy G72.0   Myopathy, unspecified G72.9   Myositis, unspecified M60.9   Rhabdomyolysis M6Y78.29 Alcoholic fatty liver K7F62.1 Cirrhosis of liver K74.69   Prediabetes R73.03   PCOS E28.2   Toxic liver disease, unspecified K71.9        Plan:  Route note to PCP prior to upcoming appointment. Michael GuerinPharmD, [facility] Michael Woods ([phone] .

## 2022-02-24 NOTE — Progress Notes (Unsigned)
Subjective:  Patient ID: Michael Woods, male    DOB: 1975/11/07  Age: 46 y.o. MRN: [id number]  Chief Complaint  Patient presents with   Diabetes   Hyperlipidemia   Hypertension   ****Needs re-wording***      Diabetes:  Complications: HTN Patient does not check sugars daily. Most recent A1C: 5.8 Current medications: Mounjaro 5 mg weekly. Helps to suppress his appetite.  Last Eye Exam:  Foot checks: Yes  Neuropathy in BL feet taking Lyrica.   Hyperlipidemia: Current medications: Zetia and Vascepa   Hypertension: Current medications: Benicar 40-25 mg daily BP been running low. Been feeling a little dizzy from time to time.    Diet: Eating healthy (trying) Exercise: Patient does not exercise regularly  Current Outpatient Medications on File Prior to Visit  Medication Sig Dispense Refill   acetaminophen (TYLENOL) 325 MG tablet Take 2 tablets (650 mg total) by mouth every 6 (six) hours as needed for mild pain (or Fever >/= 101). 12 tablet 0   allopurinol (ZYLOPRIM) 100 MG tablet Take 1 tablet (100 mg total) by mouth daily. 30 tablet 2   Colchicine (MITIGARE) 0.6 MG CAPS TAKE ONE CAPSULE BY MOUTH TWICE DAILY FOR 7 DAYS FOR GOUT flare 14 capsule 2   ezetimibe (ZETIA) 10 MG tablet Take 1 tablet (10 mg total) by mouth daily. 90 tablet 1   fenofibrate 160 MG tablet Take 1 tablet (160 mg total) by mouth daily. 90 tablet 3   fluticasone (FLONASE) 50 MCG/ACT nasal spray Place 2 sprays into both nostrils daily.      ibuprofen (ADVIL) 800 MG tablet Take 1 tablet (800 mg total) by mouth every 8 (eight) hours as needed. 40 tablet 1   icosapent Ethyl (VASCEPA) 1 g capsule Take 2 capsules (2 g total) by mouth 2 (two) times daily. 360 capsule 1   loratadine (CLARITIN) 10 MG tablet Take 10 mg by mouth daily.     olmesartan-hydrochlorothiazide (BENICAR HCT) 40-25 MG tablet TAKE ONE TABLET BY MOUTH ONCE DAILY 90 tablet 1   omega-3 acid ethyl esters (LOVAZA) 1 g capsule TAKE TWO CAPSULES  BY MOUTH TWICE DAILY 360 capsule 1   pregabalin (LYRICA) 150 MG capsule Take 1 capsule (150 mg total) by mouth 2 (two) times daily. 180 capsule 3   tirzepatide (MOUNJARO) 7.5 MG/0.5ML Pen Inject 7.5 mg into the skin once a week. 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[place])    Diabetes ([place]) 07/2020   new diagnosis   Follicular lymphoma ([place]) 2011    recurrent/notes 01/11/2018   GERD (gastroesophageal reflux disease)    "food related" (01/11/2018)   History of gout    History of stomach ulcers 1990s   HTN (hypertension)    Neuropathy    Pneumonia    recurrent/notes 01/11/2018   PONV (postoperative nausea and vomiting)    Social anxiety disorder    Past Surgical History:  Procedure Laterality Date  AXILLARY LYMPH NODE BIOPSY Left 2011   CARPAL TUNNEL RELEASE Right    KNEE ARTHROSCOPY Bilateral    LUMBAR PUNCTURE  12/2017    post LP HA and found to have csf leak requiring blood patch/notes 01/02/2018   PORTA CATH INSERTION Left 2011   TONSILLECTOMY      Family History  Problem Relation Age of Onset   Hypertension Mother    Leukemia Mother    Diabetes Mellitus II Mother    Leukemia Father    Diabetes type II Father    Social History   Socioeconomic History   Marital status: Married    Spouse name: Not on file   Number of children: 1   Years of education: Not on file   Highest education level: Not on file  Occupational History   Not on file  Tobacco Use   Smoking status: Former    Packs/day: 3.00    Years: 12.00     Total pack years: 36.00    Types: Cigarettes    Quit date: 2007    Years since quitting: 16.6   Smokeless tobacco: Never  Vaping Use   Vaping Use: Former  Substance and Sexual Activity   Alcohol use: Yes    Alcohol/week: 2.0 standard drinks of alcohol    Types: 2 Cans of beer per week   Drug use: Not Currently   Sexual activity: Yes  Other Topics Concern   Not on file  Social History Narrative   Not on file   Social Determinants of Health   Financial Resource Strain: Not on file  Food Insecurity: Not on file  Transportation Needs: Not on file  Physical Activity: Not on file  Stress: Not on file  Social Connections: Not on file    Review of Systems  Constitutional:  Negative for appetite change, fatigue and fever.  HENT:  Negative for congestion, ear pain, sinus pressure and sore throat.   Respiratory:  Negative for cough, chest tightness, shortness of breath and wheezing.   Cardiovascular:  Negative for chest pain and palpitations.  Gastrointestinal:  Negative for abdominal pain, constipation, diarrhea, nausea and vomiting.  Genitourinary:  Negative for dysuria and hematuria.  Musculoskeletal:  Negative for arthralgias, back pain, joint swelling and myalgias.  Skin:  Negative for rash.  Neurological:  Negative for dizziness, weakness and headaches.  Psychiatric/Behavioral:  Negative for dysphoric mood. The patient is not nervous/anxious.      Objective:  There were no vitals taken for this visit.     11/16/2021    8:43 AM 11/09/2021   10:13 AM 07/15/2021   10:30 AM  BP/Weight  Systolic BP 98 456 256  Diastolic BP 62 77 68  Wt. (Lbs) 269 269.4 277  BMI 33.62 kg/m2 33.67 kg/m2 34.62 kg/m2    Physical Exam Vitals reviewed.  Constitutional:      Appearance: Normal appearance. He is normal weight.  Cardiovascular:     Rate and Rhythm: Normal rate and regular rhythm.     Heart sounds: Normal heart sounds.  Pulmonary:     Effort: Pulmonary effort is normal.      Breath sounds: Normal breath sounds.  Abdominal:     General: Abdomen is flat. Bowel sounds are normal.     Palpations: Abdomen is soft.  Neurological:     Mental Status: He is alert and oriented to person, place, and time.  Psychiatric:        Mood and Affect: Mood normal.  Behavior: Behavior normal.     Diabetic Foot Exam - Simple   No data filed      Lab Results  Component Value Date   WBC 5.5 11/16/2021   HGB 16.8 11/16/2021   HCT 49.1 11/16/2021   PLT 215 11/16/2021   GLUCOSE 82 11/16/2021   CHOL 179 11/16/2021   TRIG 229 (H) 11/16/2021   HDL 29 (L) 11/16/2021   LDLCALC 110 (H) 11/16/2021   ALT 41 11/16/2021   AST 28 11/16/2021   NA 140 11/16/2021   K 4.7 11/16/2021   CL 100 11/16/2021   CREATININE 1.37 (H) 11/16/2021   BUN 15 11/16/2021   CO2 25 11/16/2021   HGBA1C 5.5 11/16/2021   MICROALBUR 30 07/15/2021      Assessment & Plan:   Problem List Items Addressed This Visit       Cardiovascular and Mediastinum   Benign essential hypertension - Primary     Digestive   NASH (nonalcoholic steatohepatitis)     Endocrine   Controlled type 2 diabetes mellitus with complication, without long-term current use of insulin (HCC)     Nervous and Auditory   Neuropathy     Other   Follicular lymphoma (HCC) (Chronic)   Gout   Mixed hyperlipidemia  .  No orders of the defined types were placed in this encounter.   No orders of the defined types were placed in this encounter.    Follow-up: No follow-ups on file.  An After Visit Summary was printed and given to the patient.    I,Lauren M Auman,acting as a scribe for Rochel Brome, MD.,have documented all relevant documentation on the behalf of Rochel Brome, MD,as directed by  Rochel Brome, MD while in the presence of Rochel Brome, MD.   Rochel Brome, MD [facility] [phone]

## 2022-02-25 ENCOUNTER — Encounter: Payer: Self-pay | Admitting: Family Medicine

## 2022-02-25 ENCOUNTER — Ambulatory Visit (INDEPENDENT_AMBULATORY_CARE_PROVIDER_SITE_OTHER): Payer: Medicare HMO | Admitting: Family Medicine

## 2022-02-25 VITALS — BP 120/70 | HR 68 | Temp 97.3°F | Ht 75.0 in | Wt 263.0 lb

## 2022-02-25 DIAGNOSIS — I10 Essential (primary) hypertension: Secondary | ICD-10-CM

## 2022-02-25 DIAGNOSIS — C8298 Follicular lymphoma, unspecified, lymph nodes of multiple sites: Secondary | ICD-10-CM

## 2022-02-25 DIAGNOSIS — E6609 Other obesity due to excess calories: Secondary | ICD-10-CM

## 2022-02-25 DIAGNOSIS — E782 Mixed hyperlipidemia: Secondary | ICD-10-CM

## 2022-02-25 DIAGNOSIS — G629 Polyneuropathy, unspecified: Secondary | ICD-10-CM | POA: Diagnosis not present

## 2022-02-25 DIAGNOSIS — E118 Type 2 diabetes mellitus with unspecified complications: Secondary | ICD-10-CM

## 2022-02-25 DIAGNOSIS — E1159 Type 2 diabetes mellitus with other circulatory complications: Secondary | ICD-10-CM | POA: Diagnosis not present

## 2022-02-25 DIAGNOSIS — Z6832 Body mass index (BMI) 32.0-32.9, adult: Secondary | ICD-10-CM | POA: Diagnosis not present

## 2022-02-25 DIAGNOSIS — M109 Gout, unspecified: Secondary | ICD-10-CM

## 2022-02-25 DIAGNOSIS — K7581 Nonalcoholic steatohepatitis (NASH): Secondary | ICD-10-CM

## 2022-02-25 DIAGNOSIS — I152 Hypertension secondary to endocrine disorders: Secondary | ICD-10-CM

## 2022-02-25 MED ORDER — ALLOPURINOL 100 MG PO TABS: 100.0000 mg | ORAL_TABLET | Freq: Every day | ORAL | 1 refills | Status: DC

## 2022-02-25 MED ORDER — TIRZEPATIDE 10 MG/0.5ML ~~LOC~~ SOAJ: 10.0000 mg | SUBCUTANEOUS | 0 refills | Status: DC

## 2022-02-25 MED ORDER — EZETIMIBE 10 MG PO TABS: 10.0000 mg | ORAL_TABLET | Freq: Every day | ORAL | 1 refills | Status: DC

## 2022-02-25 NOTE — Assessment & Plan Note (Signed)
Continue lyrica 150 mg twice daily

## 2022-02-25 NOTE — Assessment & Plan Note (Signed)
Continue mounjaro.

## 2022-02-25 NOTE — Patient Instructions (Signed)
Start on allopurinol 100 mg once daily. increase mounjaro to 10 mg weekly.  Consider starting statin cholesterol medicine.

## 2022-02-25 NOTE — Assessment & Plan Note (Signed)
Well controlled off medicines.

## 2022-02-25 NOTE — Assessment & Plan Note (Signed)
Control: good Recommend check feet daily. Recommend annual eye exams. Medicines: increase mounjaro to 10 mg weekly.  Continue to work on eating a healthy diet and exercise.  Labs drawn today.

## 2022-02-25 NOTE — Assessment & Plan Note (Signed)
Followed by Dr. Bobby Rumpf.

## 2022-02-25 NOTE — Assessment & Plan Note (Signed)
Start on allopurinol 100 mg once daily.

## 2022-02-25 NOTE — Assessment & Plan Note (Signed)
Not at goal. Consider adding statin. Continue zetia, fenofibrate, and Lovaza. Continue to work on eating a healthy diet and exercise.  Labs drawn today.

## 2022-02-26 LAB — COMPREHENSIVE METABOLIC PANEL
ALT: 44 IU/L (ref 0–44)
AST: 30 IU/L (ref 0–40)
Albumin/Globulin Ratio: 3.1 — ABNORMAL HIGH (ref 1.2–2.2)
Albumin: 4.9 g/dL (ref 4.1–5.1)
Alkaline Phosphatase: 76 IU/L (ref 44–121)
BUN/Creatinine Ratio: 11 (ref 9–20)
BUN: 15 mg/dL (ref 6–24)
Bilirubin Total: 0.5 mg/dL (ref 0.0–1.2)
CO2: 25 mmol/L (ref 20–29)
Calcium: 10.7 mg/dL — ABNORMAL HIGH (ref 8.7–10.2)
Chloride: 102 mmol/L (ref 96–106)
Creatinine, Ser: 1.4 mg/dL — ABNORMAL HIGH (ref 0.76–1.27)
Globulin, Total: 1.6 g/dL (ref 1.5–4.5)
Glucose: 85 mg/dL (ref 70–99)
Potassium: 4.5 mmol/L (ref 3.5–5.2)
Sodium: 143 mmol/L (ref 134–144)
Total Protein: 6.5 g/dL (ref 6.0–8.5)
eGFR: 64 mL/min/{1.73_m2} (ref >59)

## 2022-02-26 LAB — CBC WITH DIFF/PLATELET
Basophils Absolute: 0.1 10*3/uL (ref 0.0–0.2)
Basos: 1 %
EOS (ABSOLUTE): 0.1 10*3/uL (ref 0.0–0.4)
Eos: 1 %
Hematocrit: 52 % — ABNORMAL HIGH (ref 37.5–51.0)
Hemoglobin: 17.2 g/dL (ref 13.0–17.7)
Immature Grans (Abs): 0 10*3/uL (ref 0.0–0.1)
Immature Granulocytes: 0 %
Lymphocytes Absolute: 2.2 10*3/uL (ref 0.7–3.1)
Lymphs: 35 %
MCH: 29.8 pg (ref 26.6–33.0)
MCHC: 33.1 g/dL (ref 31.5–35.7)
MCV: 90 fL (ref 79–97)
Monocytes Absolute: 0.6 10*3/uL (ref 0.1–0.9)
Monocytes: 10 %
Neutrophils Absolute: 3.4 10*3/uL (ref 1.4–7.0)
Neutrophils: 53 %
Platelets: 216 10*3/uL (ref 150–450)
RBC: 5.77 x10E6/uL (ref 4.14–5.80)
RDW: 14.4 % (ref 11.6–15.4)
WBC: 6.5 10*3/uL (ref 3.4–10.8)

## 2022-02-26 LAB — LIPID PANEL
Chol/HDL Ratio: 5.5 ratio — ABNORMAL HIGH (ref 0.0–5.0)
Cholesterol, Total: 197 mg/dL (ref 100–199)
HDL: 36 mg/dL — ABNORMAL LOW (ref >39)
LDL Chol Calc (NIH): 129 mg/dL — ABNORMAL HIGH (ref 0–99)
Triglycerides: 178 mg/dL — ABNORMAL HIGH (ref 0–149)
VLDL Cholesterol Cal: 32 mg/dL (ref 5–40)

## 2022-02-26 LAB — MICROALBUMIN / CREATININE URINE RATIO
Creatinine, Urine: 100.1 mg/dL
Microalb/Creat Ratio: <3 mg/g creat (ref 0–29)
Microalbumin, Urine: <3 ug/mL

## 2022-02-26 LAB — HEMOGLOBIN A1C
Est. average glucose Bld gHb Est-mCnc: 105 mg/dL
Hgb A1c MFr Bld: 5.3 % (ref 4.8–5.6)

## 2022-02-26 LAB — CARDIOVASCULAR RISK ASSESSMENT

## 2022-02-28 NOTE — Progress Notes (Signed)
Blood count normal.  Liver function normal.  Kidney function abnormal. Fairly stable. Calcium elevated.  Cholesterol: elevated LDL and Trigs. Trigs have improved.  Low hdl. Continue zetia, fenofibrate, lovaza. Recommend low dose lipitor 10 mg before bed.  Recommend continue to work on eating healthy diet and exercise. HBA1C: 5.3. Improved.  Spilling protein in urine.

## 2022-03-01 ENCOUNTER — Other Ambulatory Visit: Payer: Self-pay

## 2022-03-01 MED ORDER — ATORVASTATIN CALCIUM 10 MG PO TABS: 10.0000 mg | ORAL_TABLET | Freq: Every day | ORAL | 2 refills | Status: DC

## 2022-03-23 ENCOUNTER — Other Ambulatory Visit: Payer: Self-pay | Admitting: Family Medicine

## 2022-03-23 DIAGNOSIS — M109 Gout, unspecified: Secondary | ICD-10-CM

## 2022-03-24 DIAGNOSIS — R051 Acute cough: Secondary | ICD-10-CM | POA: Diagnosis not present

## 2022-03-24 DIAGNOSIS — H1031 Unspecified acute conjunctivitis, right eye: Secondary | ICD-10-CM | POA: Diagnosis not present

## 2022-03-24 DIAGNOSIS — J180 Bronchopneumonia, unspecified organism: Secondary | ICD-10-CM | POA: Diagnosis not present

## 2022-04-26 DIAGNOSIS — H5213 Myopia, bilateral: Secondary | ICD-10-CM | POA: Diagnosis not present

## 2022-04-26 LAB — HM DIABETES EYE EXAM

## 2022-04-27 DIAGNOSIS — H52209 Unspecified astigmatism, unspecified eye: Secondary | ICD-10-CM | POA: Diagnosis not present

## 2022-04-27 DIAGNOSIS — H524 Presbyopia: Secondary | ICD-10-CM | POA: Diagnosis not present

## 2022-04-27 DIAGNOSIS — H5213 Myopia, bilateral: Secondary | ICD-10-CM | POA: Diagnosis not present

## 2022-05-11 ENCOUNTER — Other Ambulatory Visit: Payer: Self-pay

## 2022-05-11 DIAGNOSIS — M109 Gout, unspecified: Secondary | ICD-10-CM

## 2022-05-11 DIAGNOSIS — C8228 Follicular lymphoma grade III, unspecified, lymph nodes of multiple sites: Secondary | ICD-10-CM

## 2022-05-11 MED ORDER — COLCHICINE 0.6 MG PO TABS: ORAL_TABLET | ORAL | 2 refills | Status: DC

## 2022-05-11 NOTE — Progress Notes (Signed)
Michael Woods  [address] [place],  [place]  [address] [phone]  Clinic Day:  05/12/2022  Referring physician: Rochel Brome, MD  HISTORY OF PRESENT ILLNESS:  The patient is a 46 y.o. male with recurrent follicular lymphoma.  From September 2015 to June 5625, his follicular lymphoma was kept under ideal control with Revlimid/Rituxan.  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  Fortunately, all physical exams, labs, and scans done since then have not shown any evidence of recurrent lymphoma to where systemic therapy has not needed to be reinitiated.  The patient comes in today for routine follow up.  Since his last visit, he has been doing well.  He denies having any B symptoms or bulky lymphadenopathy which concerns him for his follicular lymphoma returning despite being off any form of systemic therapy for over 4 years.  PHYSICAL EXAM:  Blood pressure (!) 155/99, pulse 82, temperature 98.4 F (36.9 C), resp. rate 18, height 6' 3"  (1.905 m), weight 258 lb 11.2 oz (117.3 kg), SpO2 97 %. Wt Readings from Last 3 Encounters:  05/12/22 258 lb 11.2 oz (117.3 kg)  02/25/22 263 lb (119.3 kg)  11/16/21 269 lb (122 kg)   Body mass index is 32.34 kg/m. Performance status (ECOG): 0 Physical Exam Constitutional:      Appearance: Normal appearance. He is not ill-appearing.  HENT:     Mouth/Throat:     Mouth: Mucous membranes are moist.     Pharynx: Oropharynx is clear. No oropharyngeal exudate or posterior oropharyngeal erythema.  Cardiovascular:     Rate and Rhythm: Normal rate and regular rhythm.     Heart sounds: No murmur heard.    No friction rub. No gallop.  Pulmonary:     Effort: Pulmonary effort is normal. No respiratory distress.     Breath sounds: Normal breath sounds. No wheezing, rhonchi or rales.  Abdominal:     General: Bowel sounds are normal. There is no distension.     Palpations:  Abdomen is soft. There is no mass.     Tenderness: There is no abdominal tenderness.  Musculoskeletal:        General: No swelling.     Right lower leg: No edema.     Left lower leg: No edema.  Lymphadenopathy:     Cervical: No cervical adenopathy.     Upper Body:     Right upper body: No supraclavicular or axillary adenopathy.     Left upper body: No supraclavicular or axillary adenopathy.     Lower Body: No right inguinal adenopathy. No left inguinal adenopathy.  Skin:    General: Skin is warm.     Coloration: Skin is not jaundiced.     Findings: No lesion or rash.  Neurological:     General: No focal deficit present.     Mental Status: He is alert and oriented to person, place, and time. Mental status is at baseline.  Psychiatric:        Mood and Affect: Mood normal.        Behavior: Behavior normal.        Thought Content: Thought content normal.    LABS:    Latest Reference Range & Units 05/12/22 09:40  Sodium 135 - 145 mmol/L 142  Potassium 3.5 - 5.1 mmol/L 3.3 (L)  Chloride 98 - 111 mmol/L 106  CO2 22 - 32 mmol/L 27  Glucose 70 - 99 mg/dL  131 (H)  BUN 6 - 20 mg/dL 14  Creatinine 0.61 - 1.24 mg/dL 1.32 (H)  Calcium 8.9 - 10.3 mg/dL 9.7  Anion gap 5 - 15  9  Alkaline Phosphatase 38 - 126 U/L 62  Albumin 3.5 - 5.0 g/dL 4.1  AST 15 - 41 U/L 27  ALT 0 - 44 U/L 33  Total Protein 6.5 - 8.1 g/dL 6.5  Total Bilirubin 0.3 - 1.2 mg/dL 0.7  GFR, Est Non African American >60 mL/min >60  LDH 98 - 192 U/L 121  WBC 4.0 - 10.5 K/uL 4.6  RBC 4.22 - 5.81 MIL/uL 4.95  Hemoglobin 13.0 - 17.0 g/dL 14.5  HCT 39.0 - 52.0 % 44.5  MCV 80.0 - 100.0 fL 89.9  MCH 26.0 - 34.0 pg 29.3  MCHC 30.0 - 36.0 g/dL 32.6  RDW 11.5 - 15.5 % 15.1  Platelets 150 - 400 K/uL 208  nRBC 0.0 - 0.2 % 0.0  Neutrophils % 51  Lymphocytes % 38  Monocytes Relative % 9  Eosinophil % 1  Basophil % 1  Immature Granulocytes % 0  NEUT# 1.7 - 7.7 K/uL 2.3  Lymphocyte # 0.7 - 4.0 K/uL 1.7  Monocyte # 0.1 -  1.0 K/uL 0.4  Eosinophils Absolute 0.0 - 0.5 K/uL 0.1  Basophils Absolute 0.0 - 0.1 K/uL 0.0  Abs Immature Granulocytes 0.00 - 0.07 K/uL 0.01  (L): Data is abnormally low (H): Data is abnormally high  ASSESSMENT & PLAN:  Assessment/Plan:  A 46 y.o. male with a history of recurrent follicular lymphoma.  Based upon his labs and physical exam today, the patient remains disease-free.   Clinically, he continues to do very well.  As that is the case, I will see him back in another 6 months for repeat clinical assessment.  CT scans will be done a day before his next visit to ensure there remains no radiographic evidence of disease recurrence.  The patient understands all the plans discussed today and is in agreement with them.     Fareedah Mahler Macarthur Critchley, MD

## 2022-05-12 ENCOUNTER — Inpatient Hospital Stay: Payer: Medicare HMO

## 2022-05-12 ENCOUNTER — Inpatient Hospital Stay: Payer: Medicare HMO | Attending: Oncology | Admitting: Oncology

## 2022-05-12 ENCOUNTER — Other Ambulatory Visit: Payer: Self-pay | Admitting: Oncology

## 2022-05-12 VITALS — BP 155/99 | HR 82 | Temp 98.4°F | Resp 18 | Ht 75.0 in | Wt 258.7 lb

## 2022-05-12 DIAGNOSIS — Z8572 Personal history of non-Hodgkin lymphomas: Secondary | ICD-10-CM | POA: Insufficient documentation

## 2022-05-12 DIAGNOSIS — C8228 Follicular lymphoma grade III, unspecified, lymph nodes of multiple sites: Secondary | ICD-10-CM

## 2022-05-12 DIAGNOSIS — C8280 Other types of follicular lymphoma, unspecified site: Secondary | ICD-10-CM

## 2022-05-12 DIAGNOSIS — C8298 Follicular lymphoma, unspecified, lymph nodes of multiple sites: Secondary | ICD-10-CM

## 2022-05-12 LAB — CMP (CANCER CENTER ONLY)
ALT: 33 U/L (ref 0–44)
AST: 27 U/L (ref 15–41)
Albumin: 4.1 g/dL (ref 3.5–5.0)
Alkaline Phosphatase: 62 U/L (ref 38–126)
Anion gap: 9 (ref 5–15)
BUN: 14 mg/dL (ref 6–20)
CO2: 27 mmol/L (ref 22–32)
Calcium: 9.7 mg/dL (ref 8.9–10.3)
Chloride: 106 mmol/L (ref 98–111)
Creatinine: 1.32 mg/dL — ABNORMAL HIGH (ref 0.61–1.24)
GFR, Estimated: >60 mL/min (ref >60)
Glucose, Bld: 131 mg/dL — ABNORMAL HIGH (ref 70–99)
Potassium: 3.3 mmol/L — ABNORMAL LOW (ref 3.5–5.1)
Sodium: 142 mmol/L (ref 135–145)
Total Bilirubin: 0.7 mg/dL (ref 0.3–1.2)
Total Protein: 6.5 g/dL (ref 6.5–8.1)

## 2022-05-12 LAB — CBC WITH DIFFERENTIAL (CANCER CENTER ONLY)
Abs Immature Granulocytes: 0.01 10*3/uL (ref 0.00–0.07)
Basophils Absolute: 0 10*3/uL (ref 0.0–0.1)
Basophils Relative: 1 %
Eosinophils Absolute: 0.1 10*3/uL (ref 0.0–0.5)
Eosinophils Relative: 1 %
HCT: 44.5 % (ref 39.0–52.0)
Hemoglobin: 14.5 g/dL (ref 13.0–17.0)
Immature Granulocytes: 0 %
Lymphocytes Relative: 38 %
Lymphs Abs: 1.7 10*3/uL (ref 0.7–4.0)
MCH: 29.3 pg (ref 26.0–34.0)
MCHC: 32.6 g/dL (ref 30.0–36.0)
MCV: 89.9 fL (ref 80.0–100.0)
Monocytes Absolute: 0.4 10*3/uL (ref 0.1–1.0)
Monocytes Relative: 9 %
Neutro Abs: 2.3 10*3/uL (ref 1.7–7.7)
Neutrophils Relative %: 51 %
Platelet Count: 208 10*3/uL (ref 150–400)
RBC: 4.95 MIL/uL (ref 4.22–5.81)
RDW: 15.1 % (ref 11.5–15.5)
WBC Count: 4.6 10*3/uL (ref 4.0–10.5)
nRBC: 0 % (ref 0.0–0.2)

## 2022-05-12 LAB — LACTATE DEHYDROGENASE: LDH: 121 U/L (ref 98–192)

## 2022-05-12 MED ORDER — SODIUM CHLORIDE 0.9% FLUSH
10.0000 mL | INTRAVENOUS | Status: DC | PRN
  Administered 2022-05-12: 10 mL

## 2022-05-12 MED ORDER — HEPARIN SOD (PORK) LOCK FLUSH 100 UNIT/ML IV SOLN
500.0000 [IU] | Freq: Once | INTRAVENOUS | Status: AC | PRN
  Administered 2022-05-12: 500 [IU]

## 2022-05-14 ENCOUNTER — Encounter: Payer: Self-pay | Admitting: Hematology and Oncology

## 2022-05-24 ENCOUNTER — Other Ambulatory Visit: Payer: Self-pay | Admitting: Family Medicine

## 2022-06-01 NOTE — Assessment & Plan Note (Signed)
Well controlled.  ?No changes to medicines.  ?Continue to work on eating a healthy diet and exercise.  ?Labs drawn today.  ?

## 2022-06-01 NOTE — Progress Notes (Deleted)
Subjective:  Patient ID: Michael Woods, male    DOB: 1976-05-16  Age: 46 y.o. MRN: [id number]  Chief Complaint  Patient presents with   Diabetes   Hyperlipidemia    HPI  Diabetes:  Complications: HTN Patient does not check sugars daily. Most recent A1C: 5.3 Current medications: Mounjaro 5 mg weekly. Helps to suppress his appetite.  Last Eye Exam: 05/12/2021 (retinal screening.) Foot checks: Yes  Neuropathy in BL feet taking Lyrica.   Hyperlipidemia: Current medications: Zetia, fenofibrate, and lovaza   Hypertension: Current medications: Benicar 40-25 mg 1/2 daily. BP continued to run low, so stopped it. BP has been fine since.   Neuropathy associated with chemo. Takes lyrica 150 mg one twice daily.   Gout flares about once a month. Responds to colchicine. Patient has not been on allopurinol Diet: Eating healthy (trying) Exercise: Patient does not exercise regularly.  Current Outpatient Medications on File Prior to Visit  Medication Sig Dispense Refill   acetaminophen (TYLENOL) 325 MG tablet Take 2 tablets (650 mg total) by mouth every 6 (six) hours as needed for mild pain (or Fever >/= 101). 12 tablet 0   allopurinol (ZYLOPRIM) 100 MG tablet Take 1 tablet (100 mg total) by mouth daily. 90 tablet 1   atorvastatin (LIPITOR) 10 MG tablet Take 1 tablet (10 mg total) by mouth daily. 90 tablet 0   colchicine 0.6 MG tablet TAKE ONE TABLET BY MOUTH TWICE DAILY FOR 7 DAYS FOR GOUT flare. Hold atorvastatin while taking course of colchicine. 14 tablet 2   ezetimibe (ZETIA) 10 MG tablet Take 1 tablet (10 mg total) by mouth daily. 90 tablet 1   fenofibrate 160 MG tablet Take 1 tablet (160 mg total) by mouth daily. 90 tablet 3   fluticasone (FLONASE) 50 MCG/ACT nasal spray Place 2 sprays into both nostrils daily.      ibuprofen (ADVIL) 800 MG tablet Take 1 tablet (800 mg total) by mouth every 8 (eight) hours as needed. 40 tablet 1   loratadine (CLARITIN) 10 MG tablet Take 10 mg by  mouth daily.     omega-3 acid ethyl esters (LOVAZA) 1 g capsule TAKE TWO CAPSULES BY MOUTH TWICE DAILY 360 capsule 1   pregabalin (LYRICA) 150 MG capsule Take 1 capsule (150 mg total) by mouth 2 (two) times daily. 180 capsule 3   tirzepatide (MOUNJARO) 10 MG/0.5ML Pen Inject 10 mg into the skin once a week. 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[place])    Diabetes ([facility]) 07/2020   new diagnosis   Follicular lymphoma ([place]) 2011    recurrent/notes 01/11/2018   GERD (gastroesophageal reflux disease)    "food related" (01/11/2018)   History of gout    History of stomach ulcers 1990s   HTN (hypertension)    Neuropathy    Pneumonia    recurrent/notes 01/11/2018   PONV (postoperative nausea and vomiting)    Social anxiety disorder    Past  Surgical History:  Procedure Laterality Date   AXILLARY LYMPH NODE BIOPSY Left 2011   CARPAL TUNNEL RELEASE Right    KNEE ARTHROSCOPY Bilateral    LUMBAR PUNCTURE  12/2017    post LP HA and found to have csf leak requiring blood patch/notes 01/02/2018   PORTA CATH INSERTION Left 2011   TONSILLECTOMY      Family History  Problem Relation Age of Onset   Hypertension Mother    Leukemia Mother    Diabetes Mellitus II Mother    Leukemia Father    Diabetes type II Father    Social History   Socioeconomic History   Marital status: Married    Spouse name: Not on file   Number of children: 1   Years of education: Not on file   Highest education level: Not on file  Occupational History   Not on  file  Tobacco Use   Smoking status: Former    Packs/day: 3.00    Years: 12.00    Total pack years: 36.00    Types: Cigarettes    Quit date: 2007    Years since quitting: 16.9   Smokeless tobacco: Never  Vaping Use   Vaping Use: Former  Substance and Sexual Activity   Alcohol use: Yes    Alcohol/week: 2.0 standard drinks of alcohol    Types: 2 Cans of beer per week   Drug use: Not Currently   Sexual activity: Yes  Other Topics Concern   Not on file  Social History Narrative   Not on file   Social Determinants of Health   Financial Resource Strain: Not on file  Food Insecurity: Not on file  Transportation Needs: Not on file  Physical Activity: Not on file  Stress: Not on file  Social Connections: Not on file    Review of Systems   Objective:  There were no vitals taken for this visit.     05/12/2022   10:20 AM 02/25/2022    7:32 AM 11/16/2021    8:43 AM  BP/Weight  Systolic BP 786 767 98  Diastolic BP 99 70 62  Wt. (Lbs) 258.7 263 269  BMI 32.34 kg/m2 32.87 kg/m2 33.62 kg/m2    Physical Exam  Diabetic Foot Exam - Simple   No data filed      Lab Results  Component Value Date   WBC 4.6 05/12/2022   HGB 14.5 05/12/2022   HCT 44.5 05/12/2022   PLT 208 05/12/2022   GLUCOSE 131 (H) 05/12/2022   CHOL 197 02/25/2022   TRIG 178 (H) 02/25/2022   HDL 36 (L) 02/25/2022   LDLCALC 129 (H) 02/25/2022   ALT 33 05/12/2022   AST 27 05/12/2022   NA 142 05/12/2022   K 3.3 (L) 05/12/2022   CL 106 05/12/2022   CREATININE 1.32 (H) 05/12/2022   BUN 14 05/12/2022   CO2 27 05/12/2022   HGBA1C 5.3 02/25/2022   MICROALBUR 30 07/15/2021      Assessment & Plan:   Problem List Items Addressed This Visit       Cardiovascular and Mediastinum   Hypertension associated with diabetes ([facility]) - Primary    Well controlled.  No changes to medicines.  Continue to work on eating a healthy diet and exercise.  Labs drawn today.          Endocrine   Controlled type 2  diabetes mellitus with complication, without long-term current use of insulin (HCC)    Control:  Recommend check sugars fasting  daily. Recommend check feet daily. Recommend annual eye exams. Medicines:  Continue to work on eating a healthy diet and exercise.  Labs drawn today.           Other   Mixed hyperlipidemia    Well controlled.  No changes to medicines.  Continue to work on eating a healthy diet and exercise.  Labs drawn today.       .  No orders of the defined types were placed in this encounter.   No orders of the defined types were placed in this encounter.    Follow-up: No follow-ups on file.  An After Visit Summary was printed and given to the patient.  Rochel Brome, MD [facility] [phone]

## 2022-06-01 NOTE — Assessment & Plan Note (Addendum)
Control: good Recommend check sugars fasting daily. Recommend check feet daily. Recommend annual eye exams. Medicines: mounjaro 10 mg qweek  Continue to work on eating a healthy diet and exercise.  Labs drawn today.

## 2022-06-01 NOTE — Patient Instructions (Signed)
Things to do to keep yourself healthy  - Exercise at least 30-45 minutes a day, 3-4 days a week.  - Eat a low-fat diet with lots of fruits and vegetables, up to 7-9 servings per day.  - Seatbelts can save your life. Wear them always.  - Smoke detectors on every level of your home, check batteries every year.  - Eye Doctor - have an eye exam every 1-2 years  - [place]. Choose someone to speak for you if you are not able.  - Depression is common in our stressful world.If you're feeling down or losing interest in things you normally enjoy, please come in for a visit.  - Violence - If anyone is threatening or hurting you, please call immediately.  Increase allopurinol to 300 mg once daily after colchicine course completed.

## 2022-06-01 NOTE — Progress Notes (Signed)
Subjective:  Patient ID: Michael Woods, male    DOB: 11/16/1975  Age: 46 y.o. MRN: [id number]  Chief Complaint  Patient presents with   Diabetes   Hyperlipidemia    HPI Well Adult Physical: Patient here for a comprehensive physical exam.The patient reports  gout in left foot.  Do you take any herbs or supplements that were not prescribed by a doctor? no Are you taking calcium supplements? no Are you taking aspirin daily? no  Encounter for general adult medical examination without abnormal findings  Physical ("At Risk" items are starred): Patient's last physical exam was 1 year ago .  Patient is not afflicted from Stress Incontinence and Urge Incontinence  Patient wears a seat belt, has smoke detectors, has carbon monoxide detectors, practices appropriate gun safety, and wears sunscreen with extended sun exposure. Dental Care: biannual cleanings, brushes and flosses daily. Ophthalmology/Optometry: Annual visit.  Hearing loss: none Vision impairments: none  Diabetes:  Complications: HTN Patient does not check sugars daily. Most recent A1C: 5.5 Current medications: Mounjaro 10 mg weekly.  Last Eye Exam: 05/12/2021 (retinal screening.) Foot checks: Yes  Neuropathy in BL feet taking Lyrica 150 mg twice daily.   Hyperlipidemia: Current medications: Zetia 10 mg daily, lipitor 10 mg daily. Fenofibrate 160 mg once daily, and lovaza 1 gm 2 capsules twice daily.    Hypertension: Off all bp medicines due to low bp.   Neuropathy associated with chemo. Takes lyrica 150 mg one twice daily.   Gout flares about once a month. Responds to colchicine. Patient has not been on allopurinol Diet: Eating healthy (trying).   [place] Office Visit from 06/02/2022 in [place]  PHQ-2 Total Score 0         Social Hx   Social History   Socioeconomic History   Marital status: Married    Spouse name: Not on file   Number of children: 1   Years of education: Not on file    Highest education level: Not on file  Occupational History   Not on file  Tobacco Use   Smoking status: Former    Packs/day: 3.00    Years: 12.00    Total pack years: 36.00    Types: Cigarettes    Quit date: 2007    Years since quitting: 16.9   Smokeless tobacco: Never  Vaping Use   Vaping Use: Former  Substance and Sexual Activity   Alcohol use: Not Currently    Alcohol/week: 2.0 standard drinks of alcohol    Types: 2 Cans of beer per week   Drug use: Not Currently   Sexual activity: Yes  Other Topics Concern   Not on file  Social History Narrative   Not on file   Social Determinants of Health   Financial Resource Strain: Low Risk  (06/02/2022)   Overall Financial Resource Strain (CARDIA)    Difficulty of Paying Living Expenses: Not hard at all  Food Insecurity: No Food Insecurity (06/02/2022)   Hunger Vital Sign    Worried About Running Out of Food in the Last Year: Never true    [place] in the Last Year: Never true  Transportation Needs: No Transportation Needs (06/02/2022)   PRAPARE - Hydrologist (Medical): No    Lack of Transportation (Non-Medical): No  Physical Activity: Inactive (06/02/2022)   Exercise Vital Sign    Days of Exercise per Week: 0 days    Minutes of Exercise per Session: 0  min  Stress: No Stress Concern Present (06/02/2022)   [place]    Feeling of Stress : Only a little  Social Connections: Moderately Isolated (06/02/2022)   Social Connection and Isolation Panel [NHANES]    Frequency of Communication with Friends and Family: More than three times a week    Frequency of Social Gatherings with Friends and Family: More than three times a week    Attends Religious Services: Never    Marine scientist or Organizations: No    Attends Archivist Meetings: Never    Marital Status: Married   Past Medical History:  Diagnosis Date    Chronic lower back pain    "pulled muscle; have bone spurs" (01/11/2018)   Cryptococcal pneumonitis ([place])    Diabetes ([place]) 07/2020   new diagnosis   Follicular lymphoma ([place]) 2011    recurrent/notes 01/11/2018   GERD (gastroesophageal reflux disease)    "food related" (01/11/2018)   History of gout    History of stomach ulcers 1990s   HTN (hypertension)    Neuropathy    Pneumonia    recurrent/notes 01/11/2018   PONV (postoperative nausea and vomiting)    Social anxiety disorder    Past Surgical History:  Procedure Laterality Date   AXILLARY LYMPH NODE BIOPSY Left 2011   CARPAL TUNNEL RELEASE Right    KNEE ARTHROSCOPY Bilateral    LUMBAR PUNCTURE  12/2017    post LP HA and found to have csf leak requiring blood patch/notes 01/02/2018   PORTA CATH INSERTION Left 2011   TONSILLECTOMY      Family History  Problem Relation Age of Onset   Hypertension Mother    Leukemia Mother    Diabetes Mellitus II Mother    Leukemia Father    Diabetes type II Father     Review of Systems  Constitutional:  Negative for chills and fever.  HENT:  Negative for congestion, rhinorrhea and sore throat.   Respiratory:  Negative for cough and shortness of breath.   Cardiovascular:  Negative for chest pain and palpitations.  Gastrointestinal:  Negative for abdominal pain, constipation, diarrhea, nausea and vomiting.  Genitourinary:  Negative for dysuria and urgency.  Musculoskeletal:  Negative for arthralgias (gouty pain in left foot), back pain and myalgias.  Neurological:  Negative for dizziness and headaches.  Psychiatric/Behavioral:  Negative for dysphoric mood. The patient is not nervous/anxious.      Objective:  BP 120/76   Pulse 76   Temp (!) 96.9 F (36.1 C)   Resp 18   Ht 6' 3"  (1.905 m)   Wt 256 lb (116.1 kg)   BMI 32.00 kg/m      06/02/2022    9:03 AM 05/12/2022   10:20 AM 02/25/2022    7:32 AM  BP/Weight  Systolic BP 237 628 315  Diastolic BP 76 99 70  Wt. (Lbs) 256 258.7  263  BMI 32 kg/m2 32.34 kg/m2 32.87 kg/m2    Physical Exam Vitals reviewed.  Constitutional:      Appearance: Normal appearance.  Cardiovascular:     Rate and Rhythm: Normal rate and regular rhythm.     Heart sounds: Normal heart sounds.  Pulmonary:     Effort: Pulmonary effort is normal.     Breath sounds: Normal breath sounds.  Musculoskeletal:        General: Tenderness (left lateral ankle: edema, mild increased erythema.) present.  Neurological:     Mental Status:  He is alert and oriented to person, place, and time.  Psychiatric:        Mood and Affect: Mood normal.        Behavior: Behavior normal.     Lab Results  Component Value Date   WBC 5.6 06/02/2022   HGB 16.1 06/02/2022   HCT 46.2 06/02/2022   PLT 217 06/02/2022   GLUCOSE 82 06/02/2022   CHOL 120 06/02/2022   TRIG 73 06/02/2022   HDL 36 (L) 06/02/2022   LDLCALC 69 06/02/2022   ALT 43 06/02/2022   AST 34 06/02/2022   NA 141 06/02/2022   K 4.8 06/02/2022   CL 103 06/02/2022   CREATININE 1.41 (H) 06/02/2022   BUN 13 06/02/2022   CO2 25 06/02/2022   HGBA1C 5.3 06/02/2022   MICROALBUR 30 07/15/2021      Assessment & Plan:   Problem List Items Addressed This Visit       Cardiovascular and Mediastinum   Hypertension associated with diabetes ([place])    Well controlled.  No changes to medicines.  Continue to work on eating a healthy diet and exercise.  Labs drawn today.        Relevant Orders   Hemoglobin A1c (Completed)     Endocrine   Controlled type 2 diabetes mellitus with complication, without long-term current use of insulin (HCC)    Control: good Recommend check sugars fasting daily. Recommend check feet daily. Recommend annual eye exams. Medicines: mounjaro 10 mg qweek  Continue to work on eating a healthy diet and exercise.  Labs drawn today.         Relevant Orders   CBC with Differential/Platelet (Completed)   Comprehensive metabolic panel (Completed)   Lipid panel  (Completed)     Other   Follicular lymphoma (HCC) (Chronic)    Management per specialist.        Relevant Medications   allopurinol (ZYLOPRIM) 300 MG tablet   Gout    Change allopurinol 300 mg daily.  Increase after colchicine course.       Relevant Medications   allopurinol (ZYLOPRIM) 300 MG tablet   Other Relevant Orders   Uric acid (Completed)   Mixed hyperlipidemia    Well controlled.  No changes to medicines.  Continue to work on eating a healthy diet and exercise.  Labs drawn today.        Class 1 obesity due to excess calories with serious comorbidity and body mass index (BMI) of 32.0 to 32.9 in adult    Recommend continue to work on eating healthy diet and exercise.       Colon cancer screening   Relevant Orders   Ambulatory referral to Gastroenterology   Encounter for Medicare annual wellness exam - Primary    Things to do to keep yourself healthy  - Exercise at least 30-45 minutes a day, 3-4 days a week.  - Eat a low-fat diet with lots of fruits and vegetables, up to 7-9 servings per day.  - Seatbelts can save your life. Wear them always.  - Smoke detectors on every level of your home, check batteries every year.  - Eye Doctor - have an eye exam every 1-2 years  - [place]. Choose someone to speak for you if you are not able.  - Depression is common in our stressful world.If you're feeling down or losing interest in things you normally enjoy, please come in for a visit.  - Violence - If anyone is  threatening or hurting you, please call immediately.      This is a list of the screening recommended for you and due dates:  Health Maintenance  Topic Date Due   Colon Cancer Screening  Never done   Eye exam for diabetics  05/12/2022   Flu Shot  10/03/2022*   Hemoglobin A1C  12/01/2022   Yearly kidney health urinalysis for diabetes  02/26/2023   Complete foot exam   02/26/2023   Yearly kidney function blood test for diabetes  06/03/2023    Medicare Annual Wellness Visit  06/03/2023   DTaP/Tdap/Td vaccine (2 - Td or Tdap) 04/01/2030   HIV Screening  Completed   HPV Vaccine  Aged Out   COVID-19 Vaccine  Discontinued   Hepatitis C Screening: USPSTF Recommendation to screen - Ages 80-79 yo.  Discontinued  *Topic was postponed. The date shown is not the original due date.     Meds ordered this encounter  Medications   DISCONTD: allopurinol (ZYLOPRIM) 100 MG tablet    Sig: Take 3 tablets (300 mg total) by mouth daily.    Dispense:  90 tablet    Refill:  1   allopurinol (ZYLOPRIM) 300 MG tablet    Sig: Take 1 tablet (300 mg total) by mouth daily.    Dispense:  90 tablet    Refill:  1    Follow-up: Return in 1 year (on 06/03/2023).  An After Visit Summary was printed and given to the patient.  Rochel Brome, MD [facility] [phone]

## 2022-06-02 ENCOUNTER — Ambulatory Visit (INDEPENDENT_AMBULATORY_CARE_PROVIDER_SITE_OTHER): Payer: Medicare HMO | Admitting: Family Medicine

## 2022-06-02 ENCOUNTER — Other Ambulatory Visit: Payer: Self-pay | Admitting: Family Medicine

## 2022-06-02 ENCOUNTER — Encounter: Payer: Self-pay | Admitting: Family Medicine

## 2022-06-02 VITALS — BP 120/76 | HR 76 | Temp 96.9°F | Resp 18 | Ht 75.0 in | Wt 256.0 lb

## 2022-06-02 DIAGNOSIS — E6609 Other obesity due to excess calories: Secondary | ICD-10-CM | POA: Diagnosis not present

## 2022-06-02 DIAGNOSIS — E118 Type 2 diabetes mellitus with unspecified complications: Secondary | ICD-10-CM | POA: Diagnosis not present

## 2022-06-02 DIAGNOSIS — E782 Mixed hyperlipidemia: Secondary | ICD-10-CM | POA: Diagnosis not present

## 2022-06-02 DIAGNOSIS — Z0000 Encounter for general adult medical examination without abnormal findings: Secondary | ICD-10-CM | POA: Diagnosis not present

## 2022-06-02 DIAGNOSIS — E1159 Type 2 diabetes mellitus with other circulatory complications: Secondary | ICD-10-CM

## 2022-06-02 DIAGNOSIS — Z1211 Encounter for screening for malignant neoplasm of colon: Secondary | ICD-10-CM

## 2022-06-02 DIAGNOSIS — M109 Gout, unspecified: Secondary | ICD-10-CM

## 2022-06-02 DIAGNOSIS — I152 Hypertension secondary to endocrine disorders: Secondary | ICD-10-CM | POA: Diagnosis not present

## 2022-06-02 DIAGNOSIS — M1A4720 Other secondary chronic gout, left ankle and foot, without tophus (tophi): Secondary | ICD-10-CM

## 2022-06-02 DIAGNOSIS — C8208 Follicular lymphoma grade I, lymph nodes of multiple sites: Secondary | ICD-10-CM | POA: Diagnosis not present

## 2022-06-02 DIAGNOSIS — Z6832 Body mass index (BMI) 32.0-32.9, adult: Secondary | ICD-10-CM

## 2022-06-02 MED ORDER — ALLOPURINOL 100 MG PO TABS: 300.0000 mg | ORAL_TABLET | Freq: Every day | ORAL | 1 refills | Status: DC

## 2022-06-03 LAB — URIC ACID: Uric Acid: 4.6 mg/dL (ref 3.8–8.4)

## 2022-06-03 LAB — COMPREHENSIVE METABOLIC PANEL
ALT: 43 IU/L (ref 0–44)
AST: 34 IU/L (ref 0–40)
Albumin/Globulin Ratio: 3.3 — ABNORMAL HIGH (ref 1.2–2.2)
Albumin: 4.9 g/dL (ref 4.1–5.1)
Alkaline Phosphatase: 69 IU/L (ref 44–121)
BUN/Creatinine Ratio: 9 (ref 9–20)
BUN: 13 mg/dL (ref 6–24)
Bilirubin Total: 0.8 mg/dL (ref 0.0–1.2)
CO2: 25 mmol/L (ref 20–29)
Calcium: 10.5 mg/dL — ABNORMAL HIGH (ref 8.7–10.2)
Chloride: 103 mmol/L (ref 96–106)
Creatinine, Ser: 1.41 mg/dL — ABNORMAL HIGH (ref 0.76–1.27)
Globulin, Total: 1.5 g/dL (ref 1.5–4.5)
Glucose: 82 mg/dL (ref 70–99)
Potassium: 4.8 mmol/L (ref 3.5–5.2)
Sodium: 141 mmol/L (ref 134–144)
Total Protein: 6.4 g/dL (ref 6.0–8.5)
eGFR: 63 mL/min/{1.73_m2} (ref >59)

## 2022-06-03 LAB — CBC WITH DIFFERENTIAL/PLATELET
Basophils Absolute: 0 10*3/uL (ref 0.0–0.2)
Basos: 1 %
EOS (ABSOLUTE): 0.1 10*3/uL (ref 0.0–0.4)
Eos: 1 %
Hematocrit: 46.2 % (ref 37.5–51.0)
Hemoglobin: 16.1 g/dL (ref 13.0–17.7)
Immature Grans (Abs): 0 10*3/uL (ref 0.0–0.1)
Immature Granulocytes: 0 %
Lymphocytes Absolute: 1.9 10*3/uL (ref 0.7–3.1)
Lymphs: 33 %
MCH: 30.3 pg (ref 26.6–33.0)
MCHC: 34.8 g/dL (ref 31.5–35.7)
MCV: 87 fL (ref 79–97)
Monocytes Absolute: 0.5 10*3/uL (ref 0.1–0.9)
Monocytes: 10 %
Neutrophils Absolute: 3.1 10*3/uL (ref 1.4–7.0)
Neutrophils: 55 %
Platelets: 217 10*3/uL (ref 150–450)
RBC: 5.32 x10E6/uL (ref 4.14–5.80)
RDW: 13.9 % (ref 11.6–15.4)
WBC: 5.6 10*3/uL (ref 3.4–10.8)

## 2022-06-03 LAB — LIPID PANEL
Chol/HDL Ratio: 3.3 ratio (ref 0.0–5.0)
Cholesterol, Total: 120 mg/dL (ref 100–199)
HDL: 36 mg/dL — ABNORMAL LOW (ref >39)
LDL Chol Calc (NIH): 69 mg/dL (ref 0–99)
Triglycerides: 73 mg/dL (ref 0–149)
VLDL Cholesterol Cal: 15 mg/dL (ref 5–40)

## 2022-06-03 LAB — HEMOGLOBIN A1C
Est. average glucose Bld gHb Est-mCnc: 105 mg/dL
Hgb A1c MFr Bld: 5.3 % (ref 4.8–5.6)

## 2022-06-03 LAB — CARDIOVASCULAR RISK ASSESSMENT

## 2022-06-03 NOTE — Progress Notes (Signed)
Blood count normal.  Liver function normal.  Kidney function abnormal, but fairly stable. Uric acid normal. After colchicine course completed. Recommend increase allopurinol. Cholesterol: great HBA1C: 5.3

## 2022-06-04 ENCOUNTER — Encounter: Payer: Self-pay | Admitting: Family Medicine

## 2022-06-06 DIAGNOSIS — Z0000 Encounter for general adult medical examination without abnormal findings: Secondary | ICD-10-CM | POA: Insufficient documentation

## 2022-06-06 DIAGNOSIS — Z1211 Encounter for screening for malignant neoplasm of colon: Secondary | ICD-10-CM | POA: Insufficient documentation

## 2022-06-06 MED ORDER — ALLOPURINOL 300 MG PO TABS: 300.0000 mg | ORAL_TABLET | Freq: Every day | ORAL | 1 refills | Status: DC

## 2022-06-06 NOTE — Progress Notes (Signed)
Sent allopurinol 300 mg daily.

## 2022-06-06 NOTE — Assessment & Plan Note (Signed)
Recommend continue to work on eating healthy diet and exercise.  

## 2022-06-06 NOTE — Assessment & Plan Note (Signed)
Management per specialist. 

## 2022-06-06 NOTE — Assessment & Plan Note (Signed)
Change allopurinol 300 mg daily.  Increase after colchicine course.

## 2022-06-06 NOTE — Assessment & Plan Note (Signed)
Things to do to keep yourself healthy  - Exercise at least 30-45 minutes a day, 3-4 days a week.  - Eat a low-fat diet with lots of fruits and vegetables, up to 7-9 servings per day.  - Seatbelts can save your life. Wear them always.  - Smoke detectors on every level of your home, check batteries every year.  - Eye Doctor - have an eye exam every 1-2 years  - [place]. Choose someone to speak for you if you are not able.  - Depression is common in our stressful world.If you're feeling down or losing interest in things you normally enjoy, please come in for a visit.  - Violence - If anyone is threatening or hurting you, please call immediately.

## 2022-06-08 ENCOUNTER — Other Ambulatory Visit: Payer: Self-pay | Admitting: Family Medicine

## 2022-06-08 DIAGNOSIS — E782 Mixed hyperlipidemia: Secondary | ICD-10-CM

## 2022-06-10 ENCOUNTER — Encounter: Payer: Self-pay | Admitting: Family Medicine

## 2022-06-18 ENCOUNTER — Encounter: Payer: Self-pay | Admitting: Gastroenterology

## 2022-06-24 ENCOUNTER — Other Ambulatory Visit: Payer: Self-pay | Admitting: Family Medicine

## 2022-07-19 ENCOUNTER — Other Ambulatory Visit: Payer: Self-pay

## 2022-07-19 ENCOUNTER — Ambulatory Visit (AMBULATORY_SURGERY_CENTER): Payer: Medicare HMO | Admitting: *Deleted

## 2022-07-19 VITALS — Ht 75.0 in | Wt 250.0 lb

## 2022-07-19 DIAGNOSIS — Z1211 Encounter for screening for malignant neoplasm of colon: Secondary | ICD-10-CM

## 2022-07-19 MED ORDER — PEG 3350-KCL-NA BICARB-NACL 420 G PO SOLR: 4000.0000 mL | Freq: Once | ORAL | 0 refills | Status: AC

## 2022-07-19 NOTE — Progress Notes (Signed)
Pre visit conducted over telephone. Instructions mailed to home address.    No egg or soy allergy known to patient  No issues known to pt with past sedation with any surgeries or procedures Patient denies ever being told they had issues or difficulty with intubation  No FH of Malignant Hyperthermia Pt is not on diet pills Pt is not on  home 02  Pt is not on blood thinners  Pt denies issues with constipation  No A fib or A flutter   Pt instructed to use Singlecare.com or GoodRx for a price reduction on prep

## 2022-08-12 ENCOUNTER — Encounter: Payer: Self-pay | Admitting: Gastroenterology

## 2022-08-13 ENCOUNTER — Encounter: Payer: Medicare HMO | Admitting: Gastroenterology

## 2022-08-25 ENCOUNTER — Ambulatory Visit (AMBULATORY_SURGERY_CENTER): Payer: Medicare HMO | Admitting: Gastroenterology

## 2022-08-25 ENCOUNTER — Encounter: Payer: Self-pay | Admitting: Gastroenterology

## 2022-08-25 VITALS — BP 115/76 | HR 73 | Temp 99.1°F | Resp 14

## 2022-08-25 DIAGNOSIS — Z1211 Encounter for screening for malignant neoplasm of colon: Secondary | ICD-10-CM | POA: Diagnosis not present

## 2022-08-25 DIAGNOSIS — D123 Benign neoplasm of transverse colon: Secondary | ICD-10-CM | POA: Diagnosis not present

## 2022-08-25 DIAGNOSIS — D125 Benign neoplasm of sigmoid colon: Secondary | ICD-10-CM | POA: Diagnosis not present

## 2022-08-25 DIAGNOSIS — K635 Polyp of colon: Secondary | ICD-10-CM | POA: Diagnosis not present

## 2022-08-25 DIAGNOSIS — D120 Benign neoplasm of cecum: Secondary | ICD-10-CM | POA: Diagnosis not present

## 2022-08-25 MED ORDER — SODIUM CHLORIDE 0.9 % IV SOLN: 500.0000 mL | Freq: Once | INTRAVENOUS | Status: DC

## 2022-08-25 NOTE — Progress Notes (Signed)
Pt's states no medical or surgical changes since previsit or office visit. 

## 2022-08-25 NOTE — Progress Notes (Signed)
[facility] History and Physical   Primary Care Physician:  Rochel Brome, MD   Reason for Procedure:   Colon cancer screening -   Plan:    colonoscopy     HPI: Michael Woods is a 47 y.o. male  here for colonoscopy screening - first time exam.    Patient denies any bowel symptoms at this time. No known family history of colon cancer known - his father had "intestinal surgery". Otherwise feels well without any cardiopulmonary symptoms.   I have discussed risks / benefits of anesthesia and endoscopic procedure with Michael Woods and they wish to proceed with the exams as outlined today.    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[place])    Diabetes ([place]) 07/2020   new diagnosis   Follicular lymphoma ([place]) 2011    recurrent/notes 01/11/2018   GERD (gastroesophageal reflux disease)    "food related" (01/11/2018)   History of gout    History of stomach ulcers 1990s   HTN (hypertension)    Neuropathy    Pneumonia    recurrent/notes 01/11/2018   PONV (postoperative nausea and vomiting)    Social anxiety disorder     Past Surgical History:  Procedure Laterality Date   AXILLARY LYMPH NODE BIOPSY Left 2011   CARPAL TUNNEL RELEASE Right    KNEE ARTHROSCOPY Bilateral    LUMBAR PUNCTURE  12/2017    post LP HA and found to have csf leak requiring blood patch/notes 01/02/2018   PORTA CATH INSERTION Left 2011   TONSILLECTOMY      Prior to Admission medications   Medication Sig Start Date End Date Taking? Authorizing Provider  allopurinol (ZYLOPRIM) 300 MG tablet Take 1 tablet (300 mg total) by mouth daily. 06/06/22  Yes Cox, Kirsten, MD  atorvastatin (LIPITOR) 10 MG tablet Take 1 tablet (10 mg total) by mouth daily. 05/24/22  Yes Cox, Kirsten, MD  ezetimibe (ZETIA) 10 MG tablet Take 1 tablet (10 mg total) by mouth daily. 06/08/22  Yes Cox, Kirsten, MD  fenofibrate  160 MG tablet Take 1 tablet (160 mg total) by mouth daily. 11/18/21  Yes Cox, Kirsten, MD  loratadine (CLARITIN) 10 MG tablet Take 10 mg by mouth daily.   Yes [provider]  MOUNJARO 10 MG/0.5ML Pen Inject 10 mg into the skin once a week. 06/02/22  Yes Cox, Kirsten, MD  omega-3 acid ethyl esters (LOVAZA) 1 g capsule TAKE TWO CAPSULES BY MOUTH TWICE DAILY 06/24/22  Yes Cox, Kirsten, MD  pregabalin (LYRICA) 150 MG capsule Take 1 capsule (150 mg total) by mouth 2 (two) times daily. 12/28/21  Yes Dayton Scrape A, NP  acetaminophen (TYLENOL) 325 MG tablet Take 2 tablets (650 mg total) by mouth every 6 (six) hours as needed for mild pain (or Fever >/= 101). 01/14/18   Emokpae, Courage, MD  colchicine 0.6 MG tablet TAKE ONE TABLET BY MOUTH TWICE DAILY FOR 7 DAYS FOR GOUT flare. Hold atorvastatin while taking course of colchicine. 05/11/22   Cox, Elnita Maxwell, MD  fluticasone (FLONASE) 50 MCG/ACT nasal spray Place 2 sprays into both nostrils daily.  12/17/16   [provider]  ibuprofen (ADVIL) 800 MG tablet Take 1 tablet (800 mg total) by mouth every 8 (eight) hours as needed. 11/06/20   Marice Potter, MD  VENTOLIN HFA 108 [phone] Base) MCG/ACT inhaler  12/28/19   [provider]  Current Outpatient Medications  Medication Sig Dispense Refill   allopurinol (ZYLOPRIM) 300 MG tablet Take 1 tablet (300 mg total) by mouth daily. 90 tablet 1   atorvastatin (LIPITOR) 10 MG tablet Take 1 tablet (10 mg total) by mouth daily. 90 tablet 0   ezetimibe (ZETIA) 10 MG tablet Take 1 tablet (10 mg total) by mouth daily. 90 tablet 1   fenofibrate 160 MG tablet Take 1 tablet (160 mg total) by mouth daily. 90 tablet 3   loratadine (CLARITIN) 10 MG tablet Take 10 mg by mouth daily.     MOUNJARO 10 MG/0.5ML Pen Inject 10 mg into the skin once a week. 6 mL 0   omega-3 acid ethyl esters (LOVAZA) 1 g capsule TAKE TWO CAPSULES BY MOUTH TWICE DAILY 360 capsule 1   pregabalin (LYRICA) 150 MG capsule Take 1  capsule (150 mg total) by mouth 2 (two) times daily. 180 capsule 3   acetaminophen (TYLENOL) 325 MG tablet Take 2 tablets (650 mg total) by mouth every 6 (six) hours as needed for mild pain (or Fever >/= 101). 12 tablet 0   colchicine 0.6 MG tablet TAKE ONE TABLET BY MOUTH TWICE DAILY FOR 7 DAYS FOR GOUT flare. Hold atorvastatin while taking course of colchicine. 14 tablet 2   fluticasone (FLONASE) 50 MCG/ACT nasal spray Place 2 sprays into both nostrils daily.      ibuprofen (ADVIL) 800 MG tablet Take 1 tablet (800 mg total) by mouth every 8 (eight) hours as needed. 40 tablet 1   VENTOLIN HFA 108 (90 Base) MCG/ACT inhaler      Current Facility-Administered Medications  Medication Dose Route Frequency Provider Last Rate Last Admin   0.9 %  sodium chloride infusion  500 mL Intravenous Once B[place]ley [place], Carlota Raspberry, MD       Facility-Administered Medications Ordered in Other Visits  Medication Dose Route Frequency Provider Last Rate Last Admin   heparin lock flush 100 unit/mL  500 Units Intravenous Once Lewis, Dequincy A, MD       heparin lock flush 100 unit/mL  500 Units Intravenous Once Bobby Rumpf, Dequincy A, MD        Allergies as of 08/25/2022 - Review Complete 08/25/2022  Allergen Reaction Noted   Clonidine Other (See Comments) 08/28/2015    Family History  Problem Relation Age of Onset   Hypertension Mother    Leukemia Mother    Diabetes Mellitus II Mother    Leukemia Father    Diabetes type II Father    Colon cancer Father    Colon polyps Neg Hx    Stomach cancer Neg Hx    Esophageal cancer Neg Hx    Rectal cancer Neg Hx     Social History   Socioeconomic History   Marital status: Married    Spouse name: Not on file   Number of children: 1   Years of education: Not on file   Highest education level: Not on file  Occupational History   Not on file  Tobacco Use   Smoking status: Former    Packs/day: 3.00    Years: 12.00    Total pack years: 36.00    Types: Cigarettes     Quit date: 2007    Years since quitting: 17.1   Smokeless tobacco: Never  Vaping Use   Vaping Use: Former  Substance and Sexual Activity   Alcohol use: Not Currently    Alcohol/week: 2.0 standard drinks of alcohol    Types: 2 Cans of beer  per week   Drug use: Not Currently   Sexual activity: Yes  Other Topics Concern   Not on file  Social History Narrative   Not on file   Social Determinants of Health   Financial Resource Strain: Low Risk  (06/02/2022)   Overall Financial Resource Strain (CARDIA)    Difficulty of Paying Living Expenses: Not hard at all  Food Insecurity: No Food Insecurity (06/02/2022)   Hunger Vital Sign    Worried About Running Out of Food in the Last Year: Never true    Ran Out of Food in the Last Year: Never true  Transportation Needs: No Transportation Needs (06/02/2022)   PRAPARE - Hydrologist (Medical): No    Lack of Transportation (Non-Medical): No  Physical Activity: Inactive (06/02/2022)   Exercise Vital Sign    Days of Exercise per Week: 0 days    Minutes of Exercise per Session: 0 min  Stress: No Stress Concern Present (06/02/2022)   [place]    Feeling of Stress : Only a little  Social Connections: Moderately Isolated (06/02/2022)   Social Connection and Isolation Panel [NHANES]    Frequency of Communication with Friends and Family: More than three times a week    Frequency of Social Gatherings with Friends and Family: More than three times a week    Attends Religious Services: Never    Marine scientist or Organizations: No    Attends Archivist Meetings: Never    Marital Status: Married  Human resources officer Violence: Not At Risk (06/02/2022)   Humiliation, Afraid, Rape, and Kick questionnaire    Fear of Current or Ex-Partner: No    Emotionally Abused: No    Physically Abused: No    Sexually Abused: No    Review of Systems: All  other review of systems negative except as mentioned in the HPI.  Physical Exam: Vital signs Temp 99.1 F (37.3 C)   General:   Alert,  Well-developed, pleasant and cooperative in NAD Lungs:  Clear throughout to auscultation.   Heart:  Regular rate and rhythm Abdomen:  Soft, nontender and nondistended.   Neuro/Psych:  Alert and cooperative. Normal mood and affect. A and O x 3  Jolly Mango, MD [facility]

## 2022-08-25 NOTE — Progress Notes (Signed)
Vss nad trans to pacu °

## 2022-08-25 NOTE — Op Note (Signed)
[facility] Patient Name: Michael Woods Procedure Date: 08/25/2022 10:16 AM MRN: [id number] Endoscopist: Remo Lipps P. Havery Moros , MD, [id number] Age: 47 Referring MD:  Date of Birth: Jun 17, 1976 Gender: Male Account #: [id number] Procedure:                Colonoscopy Indications:              Screening for colorectal malignant neoplasm, This                            is the patient's first colonoscopy Medicines:                Monitored Anesthesia Care Procedure:                Pre-Anesthesia Assessment:                           - Prior to the procedure, a History and Physical                            was performed, and patient medications and                            allergies were reviewed. The patient's tolerance of                            previous anesthesia was also reviewed. The risks                            and benefits of the procedure and the sedation                            options and risks were discussed with the patient.                            All questions were answered, and informed consent                            was obtained. Prior Anticoagulants: The patient has                            taken no anticoagulant or antiplatelet agents. ASA                            Grade Assessment: III - A patient with severe                            systemic disease. After reviewing the risks and                            benefits, the patient was deemed in satisfactory                            condition to undergo the procedure.  After obtaining informed consent, the colonoscope                            was passed under direct vision. Throughout the                            procedure, the patient's blood pressure, pulse, and                            oxygen saturations were monitored continuously. The                            Olympus SN [id number] was introduced through the anus                            and advanced  to the the cecum, identified by                            appendiceal orifice and ileocecal valve. The                            colonoscopy was performed without difficulty. The                            patient tolerated the procedure well. The quality                            of the bowel preparation was good. The ileocecal                            valve, appendiceal orifice, and rectum were                            photographed. Scope In: 10:30:56 AM Scope Out: 10:50:48 AM Scope Withdrawal Time: 0 hours 16 minutes 27 seconds  Total Procedure Duration: 0 hours 19 minutes 52 seconds  Findings:                 The perianal and digital rectal examinations were                            normal.                           A diminutive polyp was found in the cecum. The                            polyp was flat. The polyp was removed with a cold                            snare. Resection and retrieval were complete.                           Three sessile polyps were found in the transverse  colon. The polyps were 2 to 4 mm in size. These                            polyps were removed with a cold snare. Resection                            and retrieval were complete.                           A 3 mm polyp was found in the sigmoid colon. The                            polyp was sessile. The polyp was removed with a                            cold snare. Resection and retrieval were complete.                           Multiple medium-mouthed diverticula were found in                            the ascending colon and cecum.                           Internal hemorrhoids were found during                            retroflexion. The hemorrhoids were moderate.                           The exam was otherwise without abnormality. Complications:            No immediate complications. Estimated blood loss:                            Minimal. Estimated Blood  Loss:     Estimated blood loss was minimal. Impression:               - One diminutive polyp in the cecum, removed with a                            cold snare. Resected and retrieved.                           - Three 2 to 4 mm polyps in the transverse colon,                            removed with a cold snare. Resected and retrieved.                           - One 3 mm polyp in the sigmoid colon, removed with                            a cold snare. Resected and  retrieved.                           - Diverticulosis in the ascending colon and in the                            cecum.                           - Internal hemorrhoids.                           - The examination was otherwise normal. Recommendation:           - Patient has a contact number available for                            emergencies. The signs and symptoms of potential                            delayed complications were discussed with the                            patient. Return to normal activities tomorrow.                            Written discharge instructions were provided to the                            patient.                           - Resume previous diet.                           - Continue present medications.                           - Await pathology results. Remo Lipps P. Havery Moros, MD 08/25/2022 10:55:12 AM This report has been signed electronically.

## 2022-08-25 NOTE — Patient Instructions (Signed)
YOU HAD AN ENDOSCOPIC PROCEDURE TODAY AT THE [facility]:   Refer to the procedure report that was given to you for any specific questions about what was found during the examination.  If the procedure report does not answer your questions, please call your gastroenterologist to clarify.  If you requested that your care partner not be given the details of your procedure findings, then the procedure report has been included in a sealed envelope for you to review at your convenience later.  YOU SHOULD EXPECT: Some feelings of bloating in the abdomen. Passage of more gas than usual.  Walking can help get rid of the air that was put into your GI tract during the procedure and reduce the bloating. If you had a lower endoscopy (such as a colonoscopy or flexible sigmoidoscopy) you may notice spotting of blood in your stool or on the toilet paper. If you underwent a bowel prep for your procedure, you may not have a normal bowel movement for a few days.  Please Note:  You might notice some irritation and congestion in your nose or some drainage.  This is from the oxygen used during your procedure.  There is no need for concern and it should clear up in a day or so.  SYMPTOMS TO REPORT IMMEDIATELY:  Following lower endoscopy (colonoscopy or flexible sigmoidoscopy):  Excessive amounts of blood in the stool  Significant tenderness or worsening of abdominal pains  Swelling of the abdomen that is new, acute  Fever of 100F or higher  For urgent or emergent issues, a gastroenterologist can be reached at any hour by calling [phone]. Do not use MyChart messaging for urgent concerns.    DIET:  We do recommend a small meal at first, but then you may proceed to your regular diet.  Drink plenty of fluids but you should avoid alcoholic beverages for 24 hours.  ACTIVITY:  You should plan to take it easy for the rest of today and you should NOT DRIVE or use heavy machinery until tomorrow (because of  the sedation medicines used during the test).    FOLLOW UP: Our staff will call the number listed on your records the next business day following your procedure.  We will call around 7:15- 8:00 am to check on you and address any questions or concerns that you may have regarding the information given to you following your procedure. If we do not reach you, we will leave a message.     If any biopsies were taken you will be contacted by phone or by letter within the next 1-3 weeks.  Please call us at [phone] if you have not heard about the biopsies in 3 weeks.    SIGNATURES/CONFIDENTIALITY: You and/or your care partner have signed paperwork which will be entered into your electronic medical record.  These signatures attest to the fact that that the information above on your After Visit Summary has been reviewed and is understood.  Full responsibility of the confidentiality of this discharge information lies with you and/or your care-partner.  

## 2022-08-25 NOTE — Progress Notes (Signed)
Called to room to assist during endoscopic procedure.  Patient ID and intended procedure confirmed with present staff. Received instructions for my participation in the procedure from the performing physician.  

## 2022-08-26 ENCOUNTER — Telehealth: Payer: Self-pay | Admitting: *Deleted

## 2022-08-26 NOTE — Telephone Encounter (Signed)
  Follow up Call-     08/25/2022    9:43 AM  Call back number  Post procedure Call Back phone  # [phone]  Permission to leave phone message Yes     Patient questions:  Do you have a fever, pain , or abdominal swelling? No. Pain Score  0 *  Have you tolerated food without any problems? Yes.    Have you been able to return to your normal activities? Yes.    Do you have any questions about your discharge instructions: Diet   No. Medications  No. Follow up visit  No.  Do you have questions or concerns about your Care? No.  Actions: * If pain score is 4 or above: No action needed, pain <4.

## 2022-08-27 ENCOUNTER — Other Ambulatory Visit: Payer: Self-pay | Admitting: Family Medicine

## 2022-08-27 DIAGNOSIS — E118 Type 2 diabetes mellitus with unspecified complications: Secondary | ICD-10-CM

## 2022-08-30 ENCOUNTER — Encounter: Payer: Self-pay | Admitting: Gastroenterology

## 2022-09-06 ENCOUNTER — Other Ambulatory Visit: Payer: Self-pay

## 2022-09-06 DIAGNOSIS — E118 Type 2 diabetes mellitus with unspecified complications: Secondary | ICD-10-CM

## 2022-09-06 MED ORDER — MOUNJARO 10 MG/0.5ML ~~LOC~~ SOAJ: SUBCUTANEOUS | 0 refills | Status: DC

## 2022-09-07 ENCOUNTER — Other Ambulatory Visit: Payer: Self-pay

## 2022-09-07 DIAGNOSIS — E118 Type 2 diabetes mellitus with unspecified complications: Secondary | ICD-10-CM

## 2022-09-07 MED ORDER — MOUNJARO 10 MG/0.5ML ~~LOC~~ SOAJ: SUBCUTANEOUS | 0 refills | Status: DC

## 2022-09-23 ENCOUNTER — Other Ambulatory Visit: Payer: Self-pay | Admitting: Hematology and Oncology

## 2022-09-23 ENCOUNTER — Other Ambulatory Visit: Payer: Self-pay | Admitting: Family Medicine

## 2022-09-23 DIAGNOSIS — G620 Drug-induced polyneuropathy: Secondary | ICD-10-CM

## 2022-09-24 ENCOUNTER — Encounter: Payer: Self-pay | Admitting: Hematology and Oncology

## 2022-10-27 ENCOUNTER — Telehealth: Payer: Self-pay | Admitting: Oncology

## 2022-10-27 NOTE — Telephone Encounter (Signed)
CT C/A/P has been scheduled for 11/08/22 @ 9:30; Checking in @ 8:30   Notified pt of date,time and instructions.

## 2022-11-08 DIAGNOSIS — R590 Localized enlarged lymph nodes: Secondary | ICD-10-CM | POA: Diagnosis not present

## 2022-11-08 DIAGNOSIS — C8298 Follicular lymphoma, unspecified, lymph nodes of multiple sites: Secondary | ICD-10-CM | POA: Diagnosis not present

## 2022-11-08 DIAGNOSIS — K5730 Diverticulosis of large intestine without perforation or abscess without bleeding: Secondary | ICD-10-CM | POA: Diagnosis not present

## 2022-11-08 LAB — CBC AND DIFFERENTIAL
HCT: 49 (ref 41–53)
Hemoglobin: 16.6 (ref 13.5–17.5)
Neutrophils Absolute: 3.81
Platelets: 232 10*3/uL (ref 150–400)
WBC: 6.8

## 2022-11-08 LAB — HEPATIC FUNCTION PANEL
ALT: 57 U/L — AB (ref 10–40)
AST: 39 (ref 14–40)
Alkaline Phosphatase: 82 (ref 25–125)
Bilirubin, Total: 1

## 2022-11-08 LAB — COMPREHENSIVE METABOLIC PANEL
Albumin: 4.6 (ref 3.5–5.0)
Calcium: 10.2 (ref 8.7–10.7)

## 2022-11-08 LAB — BASIC METABOLIC PANEL
BUN: 14 (ref 4–21)
CO2: 28 — AB (ref 13–22)
Chloride: 106 (ref 99–108)
Creatinine: 1.5 — AB (ref 0.6–1.3)
Glucose: 109
Potassium: 4.3 mEq/L (ref 3.5–5.1)
Sodium: 139 (ref 137–147)

## 2022-11-08 LAB — CBC: RBC: 5.37 — AB (ref 3.87–5.11)

## 2022-11-09 NOTE — Progress Notes (Unsigned)
[facility]  [address] [place],  [place]  [address] [phone]  Clinic Day:  11/10/2022  Referring physician: Blane Ohara, MD  HISTORY OF PRESENT ILLNESS:  The patient is a 47 y.o. male with recurrent follicular lymphoma.  From September 2015 to June 2019, his follicular lymphoma was kept under ideal control with Revlimid/Rituxan.  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  Fortunately, all physical exams, labs, and scans done since then have not shown any evidence of recurrent lymphoma to where systemic therapy has not needed to be reinitiated.  The patient comes in today for routine follow up, as well as to review his most recent CT scans.  Since his last visit, he has been doing well.  He denies having any B symptoms or bulky lymphadenopathy which concerns him for his follicular lymphoma returning despite being off any form of systemic therapy for approximately 5 years.  PHYSICAL EXAM:  Blood pressure (!) 143/89, pulse 83, temperature 98.3 F (36.8 C), temperature source Oral, resp. rate 18, height 6\' 3"  (1.905 m), weight 263 lb 12.8 oz (119.7 kg), SpO2 98 %. Wt Readings from Last 3 Encounters:  11/10/22 263 lb 12.8 oz (119.7 kg)  07/19/22 250 lb (113.4 kg)  06/02/22 256 lb (116.1 kg)   Body mass index is 32.97 kg/m. Performance status (ECOG): 0 Physical Exam Constitutional:      Appearance: Normal appearance. He is not ill-appearing.  HENT:     Mouth/Throat:     Mouth: Mucous membranes are moist.     Pharynx: Oropharynx is clear. No oropharyngeal exudate or posterior oropharyngeal erythema.  Cardiovascular:     Rate and Rhythm: Normal rate and regular rhythm.     Heart sounds: No murmur heard.    No friction rub. No gallop.  Pulmonary:     Effort: Pulmonary effort is normal. No respiratory distress.     Breath sounds: Normal breath sounds. No wheezing, rhonchi or rales.   Abdominal:     General: Bowel sounds are normal. There is no distension.     Palpations: Abdomen is soft. There is no mass.     Tenderness: There is no abdominal tenderness.  Musculoskeletal:        General: No swelling.     Right lower leg: No edema.     Left lower leg: No edema.  Lymphadenopathy:     Cervical: No cervical adenopathy.     Upper Body:     Right upper body: No supraclavicular or axillary adenopathy.     Left upper body: No supraclavicular or axillary adenopathy.     Lower Body: No right inguinal adenopathy. No left inguinal adenopathy.  Skin:    General: Skin is warm.     Coloration: Skin is not jaundiced.     Findings: No lesion or rash.  Neurological:     General: No focal deficit present.     Mental Status: He is alert and oriented to person, place, and time. Mental status is at baseline.  Psychiatric:        Mood and Affect: Mood normal.        Behavior: Behavior normal.        Thought Content: Thought content normal.   SCANS: CT scans of his chest/abdomen/pelvis revealed the following: FINDINGS: CT CHEST FINDINGS  Cardiovascular: Left chest Port-A-Cath with tip at the superior cavoatrial junction. Normal caliber thoracic aorta. No central pulmonary embolus on  this nondedicated study. Normal size heart. No significant pericardial effusion/thickening.  Mediastinum/Nodes: No suspicious thyroid nodule. No pathologically enlarged mediastinal, hilar or axillary lymph nodes. Surgical clips in the left axilla. The esophagus is grossly unremarkable.  Lungs/Pleura: Similar size of the coarsely spiculated nodule in the right lung apex measuring 2.1 x 0.9 cm on image 24/301 previously 2.2 x 1.3 cm.  No significant interval change in the subpleural nodules in the dependent left lower lobe measuring up to 9 mm on image 68/301.  No new suspicious pulmonary nodules or masses.  No pleural effusion. No pneumothorax. Central airways are patent.  Musculoskeletal:  No aggressive lytic or blastic lesion of bone.  CT ABDOMEN PELVIS FINDINGS  Hepatobiliary: Stable tiny hypodense lesion in the dome of the liver on image 39/2, favored benign cyst. No solid enhancing hepatic lesion. Calcified hepatic granuloma in the inferior right lobe of the liver. Gallbladder is unremarkable. No biliary ductal dilation.  Pancreas: No pancreatic ductal dilation or evidence of acute inflammation.  Spleen: No splenomegaly or focal splenic lesion.  Adrenals/Urinary Tract: Bilateral adrenal glands appear normal. No hydronephrosis. Kidneys demonstrate symmetric enhancement. Urinary bladder is unremarkable for degree of distension.  Stomach/Bowel: Radiopaque enteric contrast material traverses the cecum. No pathologic dilation of small or large bowel. Stomach is unremarkable for degree of distension. Normal appendix. Cecal and sigmoid colonic diverticulosis without findings of acute diverticulitis. No evidence of acute bowel inflammation.  Vascular/Lymphatic: Normal caliber abdominal aorta. Smooth IVC contours. The portal, splenic and superior mesenteric veins are patent. No pathologically enlarged abdominal or pelvic lymph nodes.  Reproductive: Prostate is unremarkable.  Other: No significant abdominopelvic free fluid.  Musculoskeletal: Multilevel degenerative changes spine.  IMPRESSION: 1. No adenopathy above or below the diaphragm and no splenomegaly. 2. Stable bilateral pulmonary nodules. 3. Colonic diverticulosis without findings of acute diverticulitis.  LABS:     ASSESSMENT & PLAN:  Assessment/Plan:  A 48 y.o. male with a history of recurrent follicular lymphoma.  In clinic today, I went over all of his CT scan images with him, for which he could see he remains disease-free.  I am also comforted by the fact that all of his peripheral counts and his LDH level remain normal.  Clinically, he continues to do very well.  As that is the case, I feel  comfortable spacing his visits out to once per year.  Labs and CT scans will be done a day before his next visit to ensure there remains no laboratory or radiographic evidence of disease recurrence.  The patient understands all the plans discussed today and is in agreement with them.     Tamryn Popko Kirby Funk, MD

## 2022-11-10 ENCOUNTER — Inpatient Hospital Stay: Payer: Medicare HMO | Attending: Oncology | Admitting: Oncology

## 2022-11-10 ENCOUNTER — Other Ambulatory Visit: Payer: Self-pay | Admitting: Oncology

## 2022-11-10 ENCOUNTER — Telehealth: Payer: Self-pay | Admitting: Oncology

## 2022-11-10 ENCOUNTER — Encounter: Payer: Self-pay | Admitting: Oncology

## 2022-11-10 VITALS — BP 143/89 | HR 83 | Temp 98.3°F | Resp 18 | Ht 75.0 in | Wt 263.8 lb

## 2022-11-10 DIAGNOSIS — C8298 Follicular lymphoma, unspecified, lymph nodes of multiple sites: Secondary | ICD-10-CM

## 2022-11-10 DIAGNOSIS — C8208 Follicular lymphoma grade I, lymph nodes of multiple sites: Secondary | ICD-10-CM | POA: Diagnosis not present

## 2022-11-10 NOTE — Telephone Encounter (Signed)
Patient has been scheduled. Aware of appt date and time   Scheduling Message Entered by Rennis Harding A on 11/10/2022 at 10:07 AM Priority: Routine <No visit type provided>  Department: [facility]  Provider:  Scheduling Notes:  Labs/scans on 11-09-23  F/u appt on 11-10-23

## 2022-11-16 ENCOUNTER — Other Ambulatory Visit: Payer: Self-pay | Admitting: Family Medicine

## 2022-11-16 DIAGNOSIS — M109 Gout, unspecified: Secondary | ICD-10-CM

## 2022-11-22 ENCOUNTER — Encounter: Payer: Self-pay | Admitting: Oncology

## 2022-11-25 ENCOUNTER — Other Ambulatory Visit: Payer: Self-pay | Admitting: Family Medicine

## 2022-12-01 NOTE — Progress Notes (Signed)
Subjective:  Patient ID: Michael Woods, male    DOB: 06/27/1976  Age: 47 y.o. MRN: [id number]  Chief Complaint  Patient presents with   Medical Management of Chronic Issues    HPI Diabetes:  Complications: HTN Patient does not check sugars daily. Most recent A1C: 5.3 Current medications: Mounjaro 12.5 mg weekly.  Last Eye Exam: 05/12/2021 (retinal screening.) Foot checks: Yes  Neuropathy in BL feet taking Lyrica 150 mg twice daily.   Hyperlipidemia: Current medications: Zetia 10 mg daily, lipitor 10 mg daily. Fenofibrate 160 mg once daily, and lovaza 1 gm 2 capsules twice daily.    Hypertension: Off all bp medicines due to low bp.   Neuropathy associated with chemo. Takes lyrica 150 mg one twice daily.   Gout flare up sometimes. Responds to colchicine and Allopurinol. Increase in allopurinol to 300 mg once daily has helped.  Diet: Eating healthy (trying).   Right shoulder pain since last fall, has taken ibuprofen 600 mg for pain which helps some. Has low back pain in am.   Follicular lymphoma: managed by [facility].      12/02/2022    8:42 AM 06/02/2022    9:10 AM 07/15/2021   10:32 AM 03/05/2021   10:51 AM 11/03/2020    8:39 AM  Depression screen PHQ 2/9  Decreased Interest 0 0 0 0 0  Down, Depressed, Hopeless 0 0 0 0 0  PHQ - 2 Score 0 0 0 0 0  Altered sleeping 0   0   Tired, decreased energy 0   0   Change in appetite 0   0   Feeling bad or failure about yourself  0   0   Trouble concentrating 0   0   Moving slowly or fidgety/restless 0   0   Suicidal thoughts 0   0   PHQ-9 Score 0   0   Difficult doing work/chores Not difficult at all   Not difficult at all         12/02/2022    8:42 AM  Fall Risk   Falls in the past year? 0  Number falls in past yr: 0  Injury with Fall? 0  Risk for fall due to : No Fall Risks  Follow up Falls evaluation completed    Patient Care Team: Blane Ohara, MD as PCP - General (Family Medicine) Weston Settle,  MD as Consulting Physician (Oncology)   Review of Systems  Constitutional:  Negative for chills, diaphoresis, fatigue and fever.  HENT:  Negative for congestion, ear pain and sore throat.   Respiratory:  Negative for cough and shortness of breath.   Cardiovascular:  Negative for chest pain and leg swelling.  Gastrointestinal:  Negative for abdominal pain, constipation, diarrhea, nausea and vomiting.  Genitourinary:  Negative for dysuria and urgency.  Musculoskeletal:  Positive for arthralgias (Right shoulder) and back pain (lower). Negative for myalgias.  Neurological:  Negative for dizziness and headaches.  Psychiatric/Behavioral:  Negative for dysphoric mood.     Current Outpatient Medications on File Prior to Visit  Medication Sig Dispense Refill   acetaminophen (TYLENOL) 325 MG tablet Take 2 tablets (650 mg total) by mouth every 6 (six) hours as needed for mild pain (or Fever >/= 101). 12 tablet 0   atorvastatin (LIPITOR) 10 MG tablet Take 1 tablet (10 mg total) by mouth daily. 90 tablet 0   ezetimibe (ZETIA) 10 MG tablet Take 1 tablet (10 mg total) by mouth daily. 90 tablet  1   fenofibrate 160 MG tablet Take 1 tablet (160 mg total) by mouth daily. 90 tablet 0   fluticasone (FLONASE) 50 MCG/ACT nasal spray Place 2 sprays into both nostrils daily.      ibuprofen (ADVIL) 800 MG tablet Take 1 tablet (800 mg total) by mouth every 8 (eight) hours as needed. 40 tablet 1   loratadine (CLARITIN) 10 MG tablet Take 10 mg by mouth daily.     omega-3 acid ethyl esters (LOVAZA) 1 g capsule TAKE TWO CAPSULES BY MOUTH TWICE DAILY 360 capsule 1   pregabalin (LYRICA) 150 MG capsule Take 1 capsule (150 mg total) by mouth 2 (two) times daily. 180 capsule 3   VENTOLIN HFA 108 (90 Base) MCG/ACT inhaler      Current Facility-Administered Medications on File Prior to Visit  Medication Dose Route Frequency Provider Last Rate Last Admin   heparin lock flush 100 unit/mL  500 Units Intravenous Once Lewis,  Dequincy A, MD       heparin lock flush 100 unit/mL  500 Units Intravenous Once Weston Settle, MD       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HCC)    Diabetes (HCC) 07/2020   new diagnosis   Follicular lymphoma (HCC) 2011    recurrent/notes 01/11/2018   GERD (gastroesophageal reflux disease)    "food related" (01/11/2018)   History of gout    History of stomach ulcers 1990s   HTN (hypertension)    Neuropathy    Pneumonia    recurrent/notes 01/11/2018   PONV (postoperative nausea and vomiting)    Social anxiety disorder    Past Surgical History:  Procedure Laterality Date   AXILLARY LYMPH NODE BIOPSY Left 2011   CARPAL TUNNEL RELEASE Right    KNEE ARTHROSCOPY Bilateral    LUMBAR PUNCTURE  12/2017    post LP HA and found to have csf leak requiring blood patch/notes 01/02/2018   PORTA CATH INSERTION Left 2011   TONSILLECTOMY      Family History  Problem Relation Age of Onset   Hypertension Mother    Leukemia Mother    Diabetes Mellitus II Mother    Leukemia Father    Diabetes type II Father    Colon cancer Father    Colon polyps Neg Hx    Stomach cancer Neg Hx    Esophageal cancer Neg Hx    Rectal cancer Neg Hx    Social History   Socioeconomic History   Marital status: Married    Spouse name: Not on file   Number of children: 1   Years of education: Not on file   Highest education level: Not on file  Occupational History   Not on file  Tobacco Use   Smoking status: Former    Packs/day: 3.00    Years: 12.00    Additional pack years: 0.00    Total pack years: 36.00    Types: Cigarettes    Quit date: 2007    Years since quitting: 17.4   Smokeless tobacco: Never  Vaping Use   Vaping Use: Former  Substance and Sexual Activity   Alcohol use: Not Currently    Alcohol/week: 2.0 standard drinks of alcohol    Types: 2 Cans of beer per week   Drug use: Not  Currently   Sexual activity: Yes  Other Topics Concern   Not on file  Social History Narrative   Not on file   Social Determinants of Health   Financial Resource Strain: Low Risk  (06/02/2022)   Overall Financial Resource Strain (CARDIA)    Difficulty of Paying Living Expenses: Not hard at all  Food Insecurity: No Food Insecurity (06/02/2022)   Hunger Vital Sign    Worried About Running Out of Food in the Last Year: Never true    Ran Out of Food in the Last Year: Never true  Transportation Needs: No Transportation Needs (06/02/2022)   PRAPARE - Administrator, Civil Service (Medical): No    Lack of Transportation (Non-Medical): No  Physical Activity: Inactive (06/02/2022)   Exercise Vital Sign    Days of Exercise per Week: 0 days    Minutes of Exercise per Session: 0 min  Stress: No Stress Concern Present (06/02/2022)   [employer] of Occupational Health - Occupational Stress Questionnaire    Feeling of Stress : Only a little  Social Connections: Moderately Isolated (06/02/2022)   Social Connection and Isolation Panel [NHANES]    Frequency of Communication with Friends and Family: More than three times a week    Frequency of Social Gatherings with Friends and Family: More than three times a week    Attends Religious Services: Never    Database administrator or Organizations: No    Attends Engineer, structural: Never    Marital Status: Married    Objective:  BP 132/74   Pulse 73   Temp (!) 97.5 F (36.4 C)   Ht 6\' 3"  (1.905 m)   Wt 260 lb (117.9 kg)   SpO2 100%   BMI 32.50 kg/m      12/02/2022    8:39 AM 11/10/2022    9:44 AM 08/25/2022   11:14 AM  BP/Weight  Systolic BP 132 143 115  Diastolic BP 74 89 76  Wt. (Lbs) 260 263.8   BMI 32.5 kg/m2 32.97 kg/m2     Physical Exam Vitals reviewed.  Constitutional:      Appearance: Normal appearance. He is normal weight.  Cardiovascular:     Rate and Rhythm: Normal rate and regular rhythm.      Heart sounds: No murmur heard. Pulmonary:     Effort: Pulmonary effort is normal.     Breath sounds: Normal breath sounds.  Abdominal:     General: Abdomen is flat. Bowel sounds are normal.     Palpations: Abdomen is soft.     Tenderness: There is no abdominal tenderness.  Musculoskeletal:     Comments: RIGHT SHOULDER EXAM TENDER: anteriorly FROM NORMAL, but actions are slow due to pain EMPTY CAN SIGN: Positive.  Lumbar tenderness BL paraspinal muscles.   Neurological:     Mental Status: He is alert and oriented to person, place, and time.  Psychiatric:        Mood and Affect: Mood normal.        Behavior: Behavior normal.     Diabetic Foot Exam - Simple   No data filed      Lab Results  Component Value Date   WBC 6.8 11/08/2022   HGB 16.6 11/08/2022   HCT 49 11/08/2022   PLT 232 11/08/2022   GLUCOSE 82 06/02/2022   CHOL 120 06/02/2022   TRIG 73 06/02/2022   HDL 36 (L) 06/02/2022   LDLCALC 69 06/02/2022   ALT 57 (A) 11/08/2022   AST 39 11/08/2022   NA 139 11/08/2022   K 4.3 11/08/2022  CL 106 11/08/2022   CREATININE 1.5 (A) 11/08/2022   BUN 14 11/08/2022   CO2 28 (A) 11/08/2022   HGBA1C 5.3 06/02/2022   MICROALBUR 30 07/15/2021      Assessment & Plan:    Hypertension associated with diabetes (HCC) Assessment & Plan: Well controlled.  No medicines.  Continue to work on eating a healthy diet and exercise.  Labs drawn today.    Orders: -     Comprehensive metabolic panel  Controlled type 2 diabetes mellitus with complication, without long-term current use of insulin (HCC) Assessment & Plan: Control: good Recommend check sugars fasting daily. Recommend check feet daily. Recommend annual eye exams. Medicines: mounjaro 12.5 mg qweek  Continue to work on eating a healthy diet and exercise.  Labs drawn today.     Orders: -     CBC with Differential/Platelet -     Hemoglobin A1c  Mixed hyperlipidemia Assessment & Plan: Well controlled.   No changes to medicines. Zetia 10 mg daily, lipitor 10 mg daily. Fenofibrate 160 mg once daily, and lovaza 1 gm 2 capsules twice daily.  Continue to work on eating a healthy diet and exercise.  Labs drawn today.    Orders: -     Lipid panel  Gout, unspecified cause, unspecified chronicity, unspecified site Assessment & Plan: Continue allopurinol and colchicine  Orders: -     Colchicine; TAKE ONE TABLET BY MOUTH TWICE DAILY FOR 7 DAYS FOR GOUT flare. Hold atorvastatin while taking course of colchicine.  Dispense: 14 tablet; Refill: 2 -     Allopurinol; Take 1 tablet (300 mg total) by mouth daily.  Dispense: 90 tablet; Refill: 1  Shoulder impingement syndrome, right Assessment & Plan: Risks were discussed including bleeding, infection, increase in sugars if diabetic, atrophy at site of injection, and increased pain.  After consent was obtained, using sterile technique the right shoulder was prepped with alcohol.  The joint was entered posteriorly and  Kenalog 40 mg and 5 ml plain Lidocaine was then injected and the needle withdrawn.  The procedure was well tolerated.   The patient is asked to continue to rest the joint for a few more days before resuming regular activities.  It may be more painful for the first 1-2 days.  Watch for fever, or increased swelling or persistent pain in the joint. Call or return to clinic prn if such symptoms occur or there is failure to improve as anticipated.   Orders: -     Cyclobenzaprine HCl; Take 1 tablet (5 mg total) by mouth 3 (three) times daily as needed for muscle spasms.  Dispense: 90 tablet; Refill: 0 -     Triamcinolone Acetonide  Class 1 obesity due to excess calories with serious comorbidity and body mass index (BMI) of 32.0 to 32.9 in adult Assessment & Plan: Recommend continue to work on eating healthy diet and exercise.    Other orders -     Tirzepatide; Inject 12.5 mg into the skin once a week.  Dispense: 6 mL; Refill: 0     Meds  ordered this encounter  Medications   colchicine 0.6 MG tablet    Sig: TAKE ONE TABLET BY MOUTH TWICE DAILY FOR 7 DAYS FOR GOUT flare. Hold atorvastatin while taking course of colchicine.    Dispense:  14 tablet    Refill:  2   allopurinol (ZYLOPRIM) 300 MG tablet    Sig: Take 1 tablet (300 mg total) by mouth daily.    Dispense:  90  tablet    Refill:  1    This prescription was filled on 11/16/2022. Any refills authorized will be placed on file.   cyclobenzaprine (FLEXERIL) 5 MG tablet    Sig: Take 1 tablet (5 mg total) by mouth 3 (three) times daily as needed for muscle spasms.    Dispense:  90 tablet    Refill:  0   DISCONTD: triamcinolone acetonide (KENALOG-40) injection 80 mg   triamcinolone acetonide (KENALOG-40) injection 40 mg   tirzepatide (MOUNJARO) 12.5 MG/0.5ML Pen    Sig: Inject 12.5 mg into the skin once a week.    Dispense:  6 mL    Refill:  0    Orders Placed This Encounter  Procedures   CBC with Differential/Platelet   Comprehensive metabolic panel   Lipid panel   Hemoglobin A1c     Follow-up: Return in about 3 months (around 03/04/2023) for chronic, fasting.   I,Marla I Leal-Borjas,acting as a scribe for Blane Ohara, MD.,have documented all relevant documentation on the behalf of Blane Ohara, MD,as directed by  Blane Ohara, MD while in the presence of Blane Ohara, MD.   An After Visit Summary was printed and given to the patient.  Blane Ohara, MD [facility] [phone]

## 2022-12-02 ENCOUNTER — Encounter: Payer: Self-pay | Admitting: Family Medicine

## 2022-12-02 ENCOUNTER — Ambulatory Visit (INDEPENDENT_AMBULATORY_CARE_PROVIDER_SITE_OTHER): Payer: Medicare HMO | Admitting: Family Medicine

## 2022-12-02 VITALS — BP 132/74 | HR 73 | Temp 97.5°F | Ht 75.0 in | Wt 260.0 lb

## 2022-12-02 DIAGNOSIS — E118 Type 2 diabetes mellitus with unspecified complications: Secondary | ICD-10-CM | POA: Diagnosis not present

## 2022-12-02 DIAGNOSIS — M7541 Impingement syndrome of right shoulder: Secondary | ICD-10-CM | POA: Diagnosis not present

## 2022-12-02 DIAGNOSIS — Z6832 Body mass index (BMI) 32.0-32.9, adult: Secondary | ICD-10-CM | POA: Diagnosis not present

## 2022-12-02 DIAGNOSIS — E1159 Type 2 diabetes mellitus with other circulatory complications: Secondary | ICD-10-CM

## 2022-12-02 DIAGNOSIS — I152 Hypertension secondary to endocrine disorders: Secondary | ICD-10-CM | POA: Diagnosis not present

## 2022-12-02 DIAGNOSIS — E6609 Other obesity due to excess calories: Secondary | ICD-10-CM | POA: Diagnosis not present

## 2022-12-02 DIAGNOSIS — M109 Gout, unspecified: Secondary | ICD-10-CM | POA: Diagnosis not present

## 2022-12-02 DIAGNOSIS — E782 Mixed hyperlipidemia: Secondary | ICD-10-CM

## 2022-12-02 DIAGNOSIS — C8208 Follicular lymphoma grade I, lymph nodes of multiple sites: Secondary | ICD-10-CM

## 2022-12-02 MED ORDER — TIRZEPATIDE 12.5 MG/0.5ML ~~LOC~~ SOAJ
12.5000 mg | SUBCUTANEOUS | 0 refills | Status: DC
Start: 2022-12-02 — End: 2022-12-06

## 2022-12-02 MED ORDER — TRIAMCINOLONE ACETONIDE 40 MG/ML IJ SUSP
40.0000 mg | Freq: Once | INTRAMUSCULAR | Status: DC
Start: 2022-12-02 — End: 2023-03-28

## 2022-12-02 MED ORDER — TRIAMCINOLONE ACETONIDE 40 MG/ML IJ SUSP
80.0000 mg | Freq: Once | INTRAMUSCULAR | Status: DC
Start: 2022-12-02 — End: 2022-12-02

## 2022-12-02 MED ORDER — CYCLOBENZAPRINE HCL 5 MG PO TABS
5.0000 mg | ORAL_TABLET | Freq: Three times a day (TID) | ORAL | 0 refills | Status: DC | PRN
Start: 2022-12-02 — End: 2023-03-01

## 2022-12-02 MED ORDER — COLCHICINE 0.6 MG PO TABS
ORAL_TABLET | ORAL | 2 refills | Status: AC
Start: 2022-12-02 — End: ?

## 2022-12-02 MED ORDER — ALLOPURINOL 300 MG PO TABS
300.0000 mg | ORAL_TABLET | Freq: Every day | ORAL | 1 refills | Status: DC
Start: 2022-12-02 — End: 2023-11-22

## 2022-12-02 NOTE — Assessment & Plan Note (Addendum)
Complications: hyperlipidemia.  Control: good Recommend check sugars fasting daily. Recommend check feet daily. Recommend annual eye exams. Medicines: mounjaro 12.5 mg qweek  Continue to work on eating a healthy diet and exercise.  Labs drawn today.

## 2022-12-02 NOTE — Assessment & Plan Note (Signed)
Risks were discussed including bleeding, infection, increase in sugars if diabetic, atrophy at site of injection, and increased pain.  After consent was obtained, using sterile technique the right shoulder was prepped with alcohol.  The joint was entered posteriorly and  Kenalog 40 mg and 5 ml plain Lidocaine was then injected and the needle withdrawn.  The procedure was well tolerated.   The patient is asked to continue to rest the joint for a few more days before resuming regular activities.  It may be more painful for the first 1-2 days.  Watch for fever, or increased swelling or persistent pain in the joint. Call or return to clinic prn if such symptoms occur or there is failure to improve as anticipated. 

## 2022-12-02 NOTE — Assessment & Plan Note (Addendum)
Well controlled.  No medicines.  Continue to work on eating a healthy diet and exercise.  Labs drawn today.   

## 2022-12-02 NOTE — Assessment & Plan Note (Signed)
Continue allopurinol and colchicine.  

## 2022-12-02 NOTE — Assessment & Plan Note (Addendum)
Well controlled.  No changes to medicines. Zetia 10 mg daily, lipitor 10 mg daily. Fenofibrate 160 mg once daily, and lovaza 1 gm 2 capsules twice daily.  Continue to work on eating a healthy diet and exercise.  Labs drawn today.

## 2022-12-03 ENCOUNTER — Other Ambulatory Visit: Payer: Self-pay

## 2022-12-03 NOTE — Assessment & Plan Note (Signed)
Recommend continue to work on eating healthy diet and exercise.  

## 2022-12-03 NOTE — Assessment & Plan Note (Signed)
Monitored by oncology ?

## 2022-12-06 ENCOUNTER — Other Ambulatory Visit: Payer: Self-pay

## 2022-12-06 DIAGNOSIS — E118 Type 2 diabetes mellitus with unspecified complications: Secondary | ICD-10-CM

## 2022-12-06 MED ORDER — TIRZEPATIDE 12.5 MG/0.5ML ~~LOC~~ SOAJ
12.5000 mg | SUBCUTANEOUS | 0 refills | Status: DC
Start: 2022-12-06 — End: 2022-12-13

## 2022-12-07 LAB — CBC WITH DIFFERENTIAL/PLATELET
Basophils Absolute: 0 10*3/uL (ref 0.0–0.2)
Basos: 1 %
EOS (ABSOLUTE): 0.1 10*3/uL (ref 0.0–0.4)
Eos: 1 %
Hematocrit: 49.9 % (ref 37.5–51.0)
Hemoglobin: 16.5 g/dL (ref 13.0–17.7)
Immature Grans (Abs): 0 10*3/uL (ref 0.0–0.1)
Immature Granulocytes: 0 %
Lymphocytes Absolute: 2 10*3/uL (ref 0.7–3.1)
Lymphs: 40 %
MCH: 30.9 pg (ref 26.6–33.0)
MCHC: 33.1 g/dL (ref 31.5–35.7)
MCV: 93 fL (ref 79–97)
Monocytes Absolute: 0.5 10*3/uL (ref 0.1–0.9)
Monocytes: 11 %
Neutrophils Absolute: 2.4 10*3/uL (ref 1.4–7.0)
Neutrophils: 47 %
Platelets: 226 10*3/uL (ref 150–450)
RBC: 5.34 x10E6/uL (ref 4.14–5.80)
RDW: 14.2 % (ref 11.6–15.4)
WBC: 5.1 10*3/uL (ref 3.4–10.8)

## 2022-12-07 LAB — COMPREHENSIVE METABOLIC PANEL
ALT: 62 IU/L — ABNORMAL HIGH (ref 0–44)
AST: 38 IU/L (ref 0–40)
Albumin/Globulin Ratio: 2.5 — ABNORMAL HIGH (ref 1.2–2.2)
Albumin: 4.8 g/dL (ref 4.1–5.1)
Alkaline Phosphatase: 85 IU/L (ref 44–121)
BUN/Creatinine Ratio: 12 (ref 9–20)
BUN: 16 mg/dL (ref 6–24)
Bilirubin Total: 0.7 mg/dL (ref 0.0–1.2)
CO2: 26 mmol/L (ref 20–29)
Calcium: 10.8 mg/dL — ABNORMAL HIGH (ref 8.7–10.2)
Chloride: 105 mmol/L (ref 96–106)
Creatinine, Ser: 1.35 mg/dL — ABNORMAL HIGH (ref 0.76–1.27)
Globulin, Total: 1.9 g/dL (ref 1.5–4.5)
Glucose: 87 mg/dL (ref 70–99)
Potassium: 4.8 mmol/L (ref 3.5–5.2)
Sodium: 143 mmol/L (ref 134–144)
Total Protein: 6.7 g/dL (ref 6.0–8.5)
eGFR: 66 mL/min/{1.73_m2} (ref >59)

## 2022-12-07 LAB — HEMOGLOBIN A1C
Est. average glucose Bld gHb Est-mCnc: 111 mg/dL
Hgb A1c MFr Bld: 5.5 % (ref 4.8–5.6)

## 2022-12-07 LAB — LIPID PANEL
Chol/HDL Ratio: 3.5 ratio (ref 0.0–5.0)
Cholesterol, Total: 132 mg/dL (ref 100–199)
HDL: 38 mg/dL — ABNORMAL LOW (ref >39)
LDL Chol Calc (NIH): 73 mg/dL (ref 0–99)
Triglycerides: 116 mg/dL (ref 0–149)
VLDL Cholesterol Cal: 21 mg/dL (ref 5–40)

## 2022-12-07 LAB — CARDIOVASCULAR RISK ASSESSMENT

## 2022-12-07 LAB — MICROALBUMIN / CREATININE URINE RATIO

## 2022-12-13 ENCOUNTER — Other Ambulatory Visit: Payer: Self-pay | Admitting: Family Medicine

## 2022-12-13 DIAGNOSIS — E118 Type 2 diabetes mellitus with unspecified complications: Secondary | ICD-10-CM

## 2022-12-21 ENCOUNTER — Other Ambulatory Visit: Payer: Self-pay | Admitting: Family Medicine

## 2023-02-15 ENCOUNTER — Other Ambulatory Visit: Payer: Self-pay | Admitting: Family Medicine

## 2023-03-01 ENCOUNTER — Other Ambulatory Visit: Payer: Self-pay | Admitting: Family Medicine

## 2023-03-01 DIAGNOSIS — M7541 Impingement syndrome of right shoulder: Secondary | ICD-10-CM

## 2023-03-02 ENCOUNTER — Other Ambulatory Visit: Payer: Self-pay | Admitting: Family Medicine

## 2023-03-02 DIAGNOSIS — E782 Mixed hyperlipidemia: Secondary | ICD-10-CM

## 2023-03-16 ENCOUNTER — Other Ambulatory Visit: Payer: Self-pay | Admitting: Family Medicine

## 2023-03-18 ENCOUNTER — Other Ambulatory Visit: Payer: Self-pay

## 2023-03-18 DIAGNOSIS — E118 Type 2 diabetes mellitus with unspecified complications: Secondary | ICD-10-CM

## 2023-03-18 MED ORDER — MOUNJARO 12.5 MG/0.5ML ~~LOC~~ SOAJ
12.5000 mg | SUBCUTANEOUS | 1 refills | Status: DC
Start: 2023-03-18 — End: 2023-05-06

## 2023-03-27 ENCOUNTER — Encounter: Payer: Self-pay | Admitting: Family Medicine

## 2023-03-27 NOTE — Assessment & Plan Note (Signed)
Well controlled.  No changes to medicines. Zetia 10 mg daily, lipitor 10 mg daily. Fenofibrate 160 mg once daily, and lovaza 1 gm 2 capsules twice daily.  Continue to work on eating a healthy diet and exercise.  Labs drawn today.

## 2023-03-27 NOTE — Progress Notes (Unsigned)
Subjective:  Patient ID: Michael Woods, male    DOB: 1975/08/30  Age: 47 y.o. MRN: [id number]  Chief Complaint  Patient presents with   Medical Management of Chronic Issues    HPI   Diabetes:  Complications: HTN Patient does not check sugars daily. Most recent A1C: 5.5 Current medications: Mounjaro 12.5 mg weekly.  Last Eye Exam: 05/12/2021 (retinal screening.) Foot checks: Yes  Neuropathy in BL feet taking Lyrica 150 mg twice daily.   Hyperlipidemia: Current medications: Zetia 10 mg daily, lipitor 10 mg daily. Fenofibrate 160 mg once daily, and lovaza 1 gm 2 capsules twice daily.    Hypertension: Off all bp medicines due to low bp.   Neuropathy associated with chemo. Takes lyrica 150 mg one twice daily.   Gout flare up sometimes. Responds to colchicine and Allopurinol. Taking allopurinol to 300 mg once daily  Diet: Eating healthy (trying).   Follicular lymphoma: managed by [facility].       12/02/2022    8:42 AM 06/02/2022    9:10 AM 07/15/2021   10:32 AM 03/05/2021   10:51 AM 11/03/2020    8:39 AM  Depression screen PHQ 2/9  Decreased Interest 0 0 0 0 0  Down, Depressed, Hopeless 0 0 0 0 0  PHQ - 2 Score 0 0 0 0 0  Altered sleeping 0   0   Tired, decreased energy 0   0   Change in appetite 0   0   Feeling bad or failure about yourself  0   0   Trouble concentrating 0   0   Moving slowly or fidgety/restless 0   0   Suicidal thoughts 0   0   PHQ-9 Score 0   0   Difficult doing work/chores Not difficult at all   Not difficult at all         12/02/2022    8:42 AM  Fall Risk   Falls in the past year? 0  Number falls in past yr: 0  Injury with Fall? 0  Risk for fall due to : No Fall Risks  Follow up Falls evaluation completed    Patient Care Team: [place] Festa, Fritzi Mandes, MD as PCP - General (Family Medicine) Weston Settle, MD as Consulting Physician (Oncology)   Review of Systems  Current Outpatient Medications on File Prior to Visit  Medication  Sig Dispense Refill   acetaminophen (TYLENOL) 325 MG tablet Take 2 tablets (650 mg total) by mouth every 6 (six) hours as needed for mild pain (or Fever >/= 101). 12 tablet 0   allopurinol (ZYLOPRIM) 300 MG tablet Take 1 tablet (300 mg total) by mouth daily. 90 tablet 1   atorvastatin (LIPITOR) 10 MG tablet TAKE ONE TABLET BY MOUTH ONCE DAILY 90 tablet 0   colchicine 0.6 MG tablet TAKE ONE TABLET BY MOUTH TWICE DAILY FOR 7 DAYS FOR GOUT flare. Hold atorvastatin while taking course of colchicine. 14 tablet 2   cyclobenzaprine (FLEXERIL) 5 MG tablet TAKE ONE TABLET BY MOUTH THREE TIMES DAILY AS NEEDED FOR MUSCLE SPASMS 90 tablet 2   ezetimibe (ZETIA) 10 MG tablet Take 1 tablet (10 mg total) by mouth daily. 90 tablet 1   fenofibrate 160 MG tablet Take 1 tablet (160 mg total) by mouth daily. 90 tablet 0   fluticasone (FLONASE) 50 MCG/ACT nasal spray Place 2 sprays into both nostrils daily.      ibuprofen (ADVIL) 800 MG tablet Take 1 tablet (800 mg total) by  mouth every 8 (eight) hours as needed. 40 tablet 1   loratadine (CLARITIN) 10 MG tablet Take 10 mg by mouth daily.     omega-3 acid ethyl esters (LOVAZA) 1 g capsule TAKE TWO CAPSULES BY MOUTH TWICE DAILY 360 capsule 1   pregabalin (LYRICA) 150 MG capsule Take 1 capsule (150 mg total) by mouth 2 (two) times daily. 180 capsule 3   tirzepatide (MOUNJARO) 12.5 MG/0.5ML Pen Inject 12.5 mg into the skin once a week. 2 mL 1   VENTOLIN HFA 108 (90 Base) MCG/ACT inhaler      Current Facility-Administered Medications on File Prior to Visit  Medication Dose Route Frequency Provider Last Rate Last Admin   heparin lock flush 100 unit/mL  500 Units Intravenous Once Lewis, Dequincy A, MD       heparin lock flush 100 unit/mL  500 Units Intravenous Once Lewis, Dequincy A, MD       triamcinolone acetonide (KENALOG-40) injection 40 mg  40 mg Intra-articular Once Seferina Brokaw, Fritzi Mandes, MD       Past Medical History:  Diagnosis Date   Chronic lower back pain    "pulled  muscle; have bone spurs" (01/11/2018)   Cryptococcal pneumonitis (HCC)    Diabetes (HCC) 07/2020   new diagnosis   Follicular lymphoma (HCC) 2011    recurrent/notes 01/11/2018   GERD (gastroesophageal reflux disease)    "food related" (01/11/2018)   Gout 07/14/2021   History of gout    History of stomach ulcers 1990s   HTN (hypertension)    Neuropathy    Pneumonia    recurrent/notes 01/11/2018   PONV (postoperative nausea and vomiting)    Social anxiety disorder    Past Surgical History:  Procedure Laterality Date   AXILLARY LYMPH NODE BIOPSY Left 2011   CARPAL TUNNEL RELEASE Right    KNEE ARTHROSCOPY Bilateral    LUMBAR PUNCTURE  12/2017    post LP HA and found to have csf leak requiring blood patch/notes 01/02/2018   PORTA CATH INSERTION Left 2011   TONSILLECTOMY      Family History  Problem Relation Age of Onset   Hypertension Mother    Leukemia Mother    Diabetes Mellitus II Mother    Leukemia Father    Diabetes type II Father    Colon cancer Father    Colon polyps Neg Hx    Stomach cancer Neg Hx    Esophageal cancer Neg Hx    Rectal cancer Neg Hx    Social History   Socioeconomic History   Marital status: Married    Spouse name: Not on file   Number of children: 1   Years of education: Not on file   Highest education level: Not on file  Occupational History   Not on file  Tobacco Use   Smoking status: Former    Current packs/day: 0.00    Average packs/day: 3.0 packs/day for 12.0 years (36.0 ttl pk-yrs)    Types: Cigarettes    Start date: 74    Quit date: 2007    Years since quitting: 17.7   Smokeless tobacco: Never  Vaping Use   Vaping status: Former  Substance and Sexual Activity   Alcohol use: Not Currently    Alcohol/week: 2.0 standard drinks of alcohol    Types: 2 Cans of beer per week   Drug use: Not Currently   Sexual activity: Yes  Other Topics Concern   Not on file  Social History Narrative   Not on file  Social Determinants of  Health   Financial Resource Strain: Low Risk  (06/02/2022)   Overall Financial Resource Strain (CARDIA)    Difficulty of Paying Living Expenses: Not hard at all  Food Insecurity: No Food Insecurity (06/02/2022)   Hunger Vital Sign    Worried About Running Out of Food in the Last Year: Never true    Ran Out of Food in the Last Year: Never true  Transportation Needs: No Transportation Needs (06/02/2022)   PRAPARE - Administrator, Civil Service (Medical): No    Lack of Transportation (Non-Medical): No  Physical Activity: Inactive (06/02/2022)   Exercise Vital Sign    Days of Exercise per Week: 0 days    Minutes of Exercise per Session: 0 min  Stress: No Stress Concern Present (06/02/2022)   [employer] of Occupational Health - Occupational Stress Questionnaire    Feeling of Stress : Only a little  Social Connections: Moderately Isolated (06/02/2022)   Social Connection and Isolation Panel [NHANES]    Frequency of Communication with Friends and Family: More than three times a week    Frequency of Social Gatherings with Friends and Family: More than three times a week    Attends Religious Services: Never    Database administrator or Organizations: No    Attends Banker Meetings: Never    Marital Status: Married    Objective:  There were no vitals taken for this visit.     12/02/2022    8:39 AM 11/10/2022    9:44 AM 08/25/2022   11:14 AM  BP/Weight  Systolic BP 132 143 115  Diastolic BP 74 89 76  Wt. (Lbs) 260 263.8   BMI 32.5 kg/m2 32.97 kg/m2     Physical Exam  Diabetic Foot Exam - Simple   No data filed      Lab Results  Component Value Date   WBC 5.1 12/02/2022   HGB 16.5 12/02/2022   HCT 49.9 12/02/2022   PLT 226 12/02/2022   GLUCOSE 87 12/02/2022   CHOL 132 12/02/2022   TRIG 116 12/02/2022   HDL 38 (L) 12/02/2022   LDLCALC 73 12/02/2022   ALT 62 (H) 12/02/2022   AST 38 12/02/2022   NA 143 12/02/2022   K 4.8 12/02/2022   CL  105 12/02/2022   CREATININE 1.35 (H) 12/02/2022   BUN 16 12/02/2022   CO2 26 12/02/2022   HGBA1C 5.5 12/02/2022   MICROALBUR 30 07/15/2021      Assessment & Plan:    Hypertension associated with diabetes (HCC) Assessment & Plan: Well controlled.  No medicines.  Continue to work on eating a healthy diet and exercise.  Labs drawn today.     Controlled type 2 diabetes mellitus with complication, without long-term current use of insulin (HCC) Assessment & Plan: Complications: hyperlipidemia.  Control: good Recommend check sugars fasting daily. Recommend check feet daily. Recommend annual eye exams. Medicines: mounjaro 12.5 mg qweek  Continue to work on eating a healthy diet and exercise.  Labs drawn today.      Mixed hyperlipidemia Assessment & Plan: Well controlled.  No changes to medicines. Zetia 10 mg daily, lipitor 10 mg daily. Fenofibrate 160 mg once daily, and lovaza 1 gm 2 capsules twice daily.  Continue to work on eating a healthy diet and exercise.  Labs drawn today.        No orders of the defined types were placed in this encounter.   No orders of the  defined types were placed in this encounter.    Follow-up: No follow-ups on file.   I,Marla I Leal-Borjas,acting as a scribe for Blane Ohara, MD.,have documented all relevant documentation on the behalf of Blane Ohara, MD,as directed by  Blane Ohara, MD while in the presence of Blane Ohara, MD.   An After Visit Summary was printed and given to the patient.  Blane Ohara, MD [facility] [phone]

## 2023-03-27 NOTE — Assessment & Plan Note (Signed)
Complications: hyperlipidemia.  Control: good Recommend check sugars fasting daily. Recommend check feet daily. Recommend annual eye exams. Medicines: mounjaro 12.5 mg qweek  Continue to work on eating a healthy diet and exercise.  Labs drawn today.

## 2023-03-27 NOTE — Assessment & Plan Note (Signed)
Well controlled.  No medicines.  Continue to work on eating a healthy diet and exercise.  Labs drawn today.

## 2023-03-28 ENCOUNTER — Ambulatory Visit (INDEPENDENT_AMBULATORY_CARE_PROVIDER_SITE_OTHER): Payer: Medicare HMO | Admitting: Family Medicine

## 2023-03-28 ENCOUNTER — Encounter: Payer: Self-pay | Admitting: Family Medicine

## 2023-03-28 VITALS — BP 130/82 | HR 78 | Temp 97.3°F | Ht 75.0 in | Wt 257.0 lb

## 2023-03-28 DIAGNOSIS — Z6832 Body mass index (BMI) 32.0-32.9, adult: Secondary | ICD-10-CM

## 2023-03-28 DIAGNOSIS — C8208 Follicular lymphoma grade I, lymph nodes of multiple sites: Secondary | ICD-10-CM | POA: Diagnosis not present

## 2023-03-28 DIAGNOSIS — E118 Type 2 diabetes mellitus with unspecified complications: Secondary | ICD-10-CM | POA: Diagnosis not present

## 2023-03-28 DIAGNOSIS — E1159 Type 2 diabetes mellitus with other circulatory complications: Secondary | ICD-10-CM

## 2023-03-28 DIAGNOSIS — M7541 Impingement syndrome of right shoulder: Secondary | ICD-10-CM | POA: Diagnosis not present

## 2023-03-28 DIAGNOSIS — K7581 Nonalcoholic steatohepatitis (NASH): Secondary | ICD-10-CM

## 2023-03-28 DIAGNOSIS — G629 Polyneuropathy, unspecified: Secondary | ICD-10-CM | POA: Diagnosis not present

## 2023-03-28 DIAGNOSIS — E782 Mixed hyperlipidemia: Secondary | ICD-10-CM | POA: Diagnosis not present

## 2023-03-28 DIAGNOSIS — Z7985 Long-term (current) use of injectable non-insulin antidiabetic drugs: Secondary | ICD-10-CM

## 2023-03-28 DIAGNOSIS — E6609 Other obesity due to excess calories: Secondary | ICD-10-CM

## 2023-03-28 DIAGNOSIS — I152 Hypertension secondary to endocrine disorders: Secondary | ICD-10-CM

## 2023-03-28 LAB — HEMOGLOBIN A1C
Est. average glucose Bld gHb Est-mCnc: 111 mg/dL
Hgb A1c MFr Bld: 5.5 % (ref 4.8–5.6)

## 2023-03-28 LAB — LIPID PANEL
Chol/HDL Ratio: 3.8 ratio (ref 0.0–5.0)
Cholesterol, Total: 124 mg/dL (ref 100–199)
HDL: 33 mg/dL — ABNORMAL LOW (ref >39)
LDL Chol Calc (NIH): 76 mg/dL (ref 0–99)
Triglycerides: 75 mg/dL (ref 0–149)
VLDL Cholesterol Cal: 15 mg/dL (ref 5–40)

## 2023-03-28 LAB — COMPREHENSIVE METABOLIC PANEL
ALT: 49 IU/L — ABNORMAL HIGH (ref 0–44)
AST: 35 IU/L (ref 0–40)
Albumin: 4.5 g/dL (ref 4.1–5.1)
Alkaline Phosphatase: 87 IU/L (ref 44–121)
BUN/Creatinine Ratio: 6 — ABNORMAL LOW (ref 9–20)
BUN: 8 mg/dL (ref 6–24)
Bilirubin Total: 0.6 mg/dL (ref 0.0–1.2)
CO2: 23 mmol/L (ref 20–29)
Calcium: 10.3 mg/dL — ABNORMAL HIGH (ref 8.7–10.2)
Chloride: 105 mmol/L (ref 96–106)
Creatinine, Ser: 1.32 mg/dL — ABNORMAL HIGH (ref 0.76–1.27)
Globulin, Total: 1.6 g/dL (ref 1.5–4.5)
Glucose: 87 mg/dL (ref 70–99)
Potassium: 4.6 mmol/L (ref 3.5–5.2)
Sodium: 143 mmol/L (ref 134–144)
Total Protein: 6.1 g/dL (ref 6.0–8.5)
eGFR: 68 mL/min/{1.73_m2} (ref >59)

## 2023-03-28 LAB — CBC WITH DIFFERENTIAL/PLATELET
Basophils Absolute: 0 10*3/uL (ref 0.0–0.2)
Basos: 1 %
EOS (ABSOLUTE): 0.1 10*3/uL (ref 0.0–0.4)
Eos: 2 %
Hematocrit: 47.3 % (ref 37.5–51.0)
Hemoglobin: 15.8 g/dL (ref 13.0–17.7)
Immature Grans (Abs): 0 10*3/uL (ref 0.0–0.1)
Immature Granulocytes: 0 %
Lymphocytes Absolute: 1.8 10*3/uL (ref 0.7–3.1)
Lymphs: 27 %
MCH: 31.2 pg (ref 26.6–33.0)
MCHC: 33.4 g/dL (ref 31.5–35.7)
MCV: 93 fL (ref 79–97)
Monocytes Absolute: 0.6 10*3/uL (ref 0.1–0.9)
Monocytes: 9 %
Neutrophils Absolute: 4.1 10*3/uL (ref 1.4–7.0)
Neutrophils: 61 %
Platelets: 270 10*3/uL (ref 150–450)
RBC: 5.07 x10E6/uL (ref 4.14–5.80)
RDW: 14 % (ref 11.6–15.4)
WBC: 6.7 10*3/uL (ref 3.4–10.8)

## 2023-03-28 MED ORDER — TRIAMCINOLONE ACETONIDE 40 MG/ML IJ SUSP
40.0000 mg | Freq: Once | INTRAMUSCULAR | Status: AC
Start: 2023-03-28 — End: ?

## 2023-03-28 NOTE — Assessment & Plan Note (Signed)
Continue lyrica

## 2023-03-28 NOTE — Assessment & Plan Note (Signed)
Order shoulder xray.  Arthrocentesis of right shoulder.

## 2023-03-28 NOTE — Assessment & Plan Note (Signed)
Check cmp

## 2023-03-28 NOTE — Assessment & Plan Note (Signed)
Management per specialist. 

## 2023-03-28 NOTE — Assessment & Plan Note (Signed)
Recommend continue to work on eating healthy diet and exercise. Comorbidities: hyperlipidemia and diabetes.

## 2023-03-29 ENCOUNTER — Other Ambulatory Visit: Payer: Self-pay

## 2023-03-29 ENCOUNTER — Encounter: Payer: Self-pay | Admitting: Family Medicine

## 2023-03-29 DIAGNOSIS — M7541 Impingement syndrome of right shoulder: Secondary | ICD-10-CM

## 2023-05-06 ENCOUNTER — Other Ambulatory Visit: Payer: Self-pay | Admitting: Family Medicine

## 2023-05-06 DIAGNOSIS — E118 Type 2 diabetes mellitus with unspecified complications: Secondary | ICD-10-CM

## 2023-05-09 ENCOUNTER — Other Ambulatory Visit: Payer: Self-pay

## 2023-05-09 DIAGNOSIS — E118 Type 2 diabetes mellitus with unspecified complications: Secondary | ICD-10-CM

## 2023-05-09 MED ORDER — MOUNJARO 12.5 MG/0.5ML ~~LOC~~ SOAJ: 12.5000 mg | SUBCUTANEOUS | 0 refills | Status: DC

## 2023-05-18 ENCOUNTER — Other Ambulatory Visit: Payer: Self-pay | Admitting: Family Medicine

## 2023-05-20 ENCOUNTER — Telehealth: Payer: Self-pay | Admitting: Family Medicine

## 2023-05-20 NOTE — Telephone Encounter (Signed)
Mailed out lab results  

## 2023-05-24 ENCOUNTER — Ambulatory Visit: Payer: Medicare HMO

## 2023-05-24 VITALS — Ht 75.0 in | Wt 250.0 lb

## 2023-05-24 DIAGNOSIS — Z0000 Encounter for general adult medical examination without abnormal findings: Secondary | ICD-10-CM | POA: Diagnosis not present

## 2023-05-24 NOTE — Patient Instructions (Addendum)
Michael Woods , Thank you for taking time to come for your Medicare Wellness Visit. I appreciate your ongoing commitment to your health goals. Please review the following plan we discussed and let me know if I can assist you in the future.   Referrals/Orders/Follow-Ups/Clinician Recommendations: No  This is a list of the screening recommended for you and due dates:  Health Maintenance  Topic Date Due   Complete foot exam   02/26/2023   Eye exam for diabetics  04/27/2023   Flu Shot  10/03/2023*   Hemoglobin A1C  09/25/2023   Yearly kidney health urinalysis for diabetes  12/02/2023   Yearly kidney function blood test for diabetes  03/27/2024   Medicare Annual Wellness Visit  05/23/2024   Colon Cancer Screening  08/26/2027   DTaP/Tdap/Td vaccine (2 - Td or Tdap) 04/01/2030   HIV Screening  Completed   HPV Vaccine  Aged Out   COVID-19 Vaccine  Discontinued   Hepatitis C Screening  Discontinued  *Topic was postponed. The date shown is not the original due date.    Advanced directives: (Declined) Advance directive discussed with you today. Even though you declined this today, please call our office should you change your mind, and we can give you the proper paperwork for you to fill out.  Next Medicare Annual Wellness Visit scheduled for next year: No

## 2023-05-24 NOTE — Progress Notes (Signed)
Subjective:   Michael Woods is a 47 y.o. male who presents for Medicare Annual/Subsequent preventive examination.  Visit Complete: Virtual I connected with  Michael Woods on 05/24/23 by a audio enabled telemedicine application and verified that I am speaking with the correct person using two identifiers.  Patient Location: Home  Provider Location: Office/Clinic  I discussed the limitations of evaluation and management by telemedicine. The patient expressed understanding and agreed to proceed.  Vital Signs: Because this visit was a virtual/telehealth visit, some criteria may be missing or patient reported. Any vitals not documented were not able to be obtained and vitals that have been documented are patient reported.  Cardiac Risk Factors include: advanced age (>53men, >50 women);diabetes mellitus;dyslipidemia;hypertension;male gender;obesity (BMI >30kg/m2)     Objective:    Today's Vitals   05/24/23 1208 05/24/23 1214  Weight: 250 lb (113.4 kg) 250 lb (113.4 kg)  Height: 6\' 3"  (1.905 m) 6\' 3"  (1.905 m)  PainSc: 0-No pain 0-No pain   Body mass index is 31.25 kg/m.     05/24/2023   12:16 PM 05/12/2022   10:20 AM 11/09/2021   10:12 AM 05/11/2021    9:43 AM 11/06/2020    9:33 AM 01/11/2018    3:55 PM  Advanced Directives  Does Patient Have a Medical Advance Directive? No No No No No No  Would patient like information on creating a medical advance directive? No - Patient declined    No - Patient declined No - Patient declined    Current Medications (verified) Outpatient Encounter Medications as of 05/24/2023  Medication Sig   acetaminophen (TYLENOL) 325 MG tablet Take 2 tablets (650 mg total) by mouth every 6 (six) hours as needed for mild pain (or Fever >/= 101).   allopurinol (ZYLOPRIM) 300 MG tablet Take 1 tablet (300 mg total) by mouth daily.   atorvastatin (LIPITOR) 10 MG tablet TAKE ONE TABLET BY MOUTH ONCE DAILY   colchicine 0.6 MG tablet TAKE ONE TABLET BY  MOUTH TWICE DAILY FOR 7 DAYS FOR GOUT flare. Hold atorvastatin while taking course of colchicine.   cyclobenzaprine (FLEXERIL) 5 MG tablet TAKE ONE TABLET BY MOUTH THREE TIMES DAILY AS NEEDED FOR MUSCLE SPASMS   ezetimibe (ZETIA) 10 MG tablet Take 1 tablet (10 mg total) by mouth daily.   fenofibrate 160 MG tablet TAKE ONE TABLET BY MOUTH ONCE DAILY   fluticasone (FLONASE) 50 MCG/ACT nasal spray Place 2 sprays into both nostrils daily.    ibuprofen (ADVIL) 800 MG tablet Take 1 tablet (800 mg total) by mouth every 8 (eight) hours as needed.   loratadine (CLARITIN) 10 MG tablet Take 10 mg by mouth daily.   omega-3 acid ethyl esters (LOVAZA) 1 g capsule TAKE TWO CAPSULES BY MOUTH TWICE DAILY   pregabalin (LYRICA) 150 MG capsule Take 1 capsule (150 mg total) by mouth 2 (two) times daily.   tirzepatide (MOUNJARO) 12.5 MG/0.5ML Pen Inject 12.5 mg into the skin once a week.   VENTOLIN HFA 108 (90 Base) MCG/ACT inhaler    Facility-Administered Encounter Medications as of 05/24/2023  Medication   heparin lock flush 100 unit/mL   heparin lock flush 100 unit/mL   triamcinolone acetonide (KENALOG-40) injection 40 mg    Allergies (verified) Clonidine   History: 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HCC)    Diabetes (HCC) 07/2020   new diagnosis   Follicular  lymphoma (HCC) 2011    recurrent/notes 01/11/2018   GERD (gastroesophageal reflux disease)    "food related" (01/11/2018)   Gout 07/14/2021   History of gout    History of stomach ulcers 1990s   HTN (hypertension)    Neuropathy    Pneumonia    recurrent/notes 01/11/2018   PONV (postoperative nausea and vomiting)    Social anxiety disorder    Past Surgical History:  Procedure Laterality Date   AXILLARY LYMPH NODE BIOPSY Left 2011   CARPAL TUNNEL RELEASE Right    KNEE ARTHROSCOPY Bilateral    LUMBAR PUNCTURE  12/2017    post LP HA  and found to have csf leak requiring blood patch/notes 01/02/2018   PORTA CATH INSERTION Left 2011   TONSILLECTOMY     Family History  Problem Relation Age of Onset   Hypertension Mother    Leukemia Mother    Diabetes Mellitus II Mother    Leukemia Father    Diabetes type II Father    Colon cancer Father    Colon polyps Neg Hx    Stomach cancer Neg Hx    Esophageal cancer Neg Hx    Rectal cancer Neg Hx    Social History   Socioeconomic History   Marital status: Married    Spouse name: Not on file   Number of children: 1   Years of education: Not on file   Highest education level: Not on file  Occupational History   Not on file  Tobacco Use   Smoking status: Former    Current packs/day: 0.00    Average packs/day: 3.0 packs/day for 12.0 years (36.0 ttl pk-yrs)    Types: Cigarettes    Start date: 97    Quit date: 2007    Years since quitting: 17.8   Smokeless tobacco: Never  Vaping Use   Vaping status: Former  Substance and Sexual Activity   Alcohol use: Not Currently    Alcohol/week: 2.0 standard drinks of alcohol    Types: 2 Cans of beer per week   Drug use: Not Currently   Sexual activity: Yes  Other Topics Concern   Not on file  Social History Narrative   Not on file   Social Determinants of Health   Financial Resource Strain: Low Risk  (05/24/2023)   Overall Financial Resource Strain (CARDIA)    Difficulty of Paying Living Expenses: Not hard at all  Food Insecurity: No Food Insecurity (05/24/2023)   Hunger Vital Sign    Worried About Running Out of Food in the Last Year: Never true    Ran Out of Food in the Last Year: Never true  Transportation Needs: No Transportation Needs (05/24/2023)   PRAPARE - Administrator, Civil Service (Medical): No    Lack of Transportation (Non-Medical): No  Physical Activity: Inactive (05/24/2023)   Exercise Vital Sign    Days of Exercise per Week: 0 days    Minutes of Exercise per Session: 0 min  Stress: No  Stress Concern Present (05/24/2023)   [employer] of Occupational Health - Occupational Stress Questionnaire    Feeling of Stress : Not at all  Social Connections: Moderately Isolated (05/24/2023)   Social Connection and Isolation Panel [NHANES]    Frequency of Communication with Friends and Family: More than three times a week    Frequency of Social Gatherings with Friends and Family: More than three times a week    Attends Religious Services: Never    Active Member  of Clubs or Organizations: No    Attends Banker Meetings: Never    Marital Status: Married    Tobacco Counseling Counseling given: Not Answered   Clinical Intake:  Pre-visit preparation completed: Yes  Pain : No/denies pain Pain Score: 0-No pain     BMI - recorded: 31.25 Nutritional Status: BMI > 30  Obese Nutritional Risks: None Diabetes: Yes CBG done?: No Did pt. bring in CBG monitor from home?: No  How often do you need to have someone help you when you read instructions, pamphlets, or other written materials from your doctor or pharmacy?: 1 - Never What is the last grade level you completed in school?: HSG; Some College  Interpreter Needed?: No  Information entered by :: Megyn Leng N. Murriel Holwerda, LPN.   Activities of Daily Living    05/24/2023   12:19 PM 12/02/2022    8:43 AM  In your present state of health, do you have any difficulty performing the following activities:  Hearing? 0 0  Vision? 0 0  Difficulty concentrating or making decisions? 0 0  Walking or climbing stairs? 0 0  Dressing or bathing? 0 0  Doing errands, shopping? 0 0  Preparing Food and eating ? N   Using the Toilet? N   In the past six months, have you accidently leaked urine? N   Do you have problems with loss of bowel control? N   Managing your Medications? N   Managing your Finances? N   Housekeeping or managing your Housekeeping? N     Patient Care Team: Blane Ohara, MD as PCP - General (Family  Medicine) Weston Settle, MD as Consulting Physician (Oncology)  Indicate any recent Medical Services you may have received from other than [facility] providers in the past year (date may be approximate).     Assessment:   This is a routine wellness examination for Noelle.  Hearing/Vision screen Hearing Screening - Comments:: Patient denied any hearing difficulty.   No hearing aids.  Vision Screening - Comments:: Any vision impairment: Yes, patient wears corrective lenses/contacts/readers. Eye exam done annually by: [facility]    Goals Addressed             This Visit's Progress    Client understands the importance of follow-up with providers by attending scheduled visits        Depression Screen    05/24/2023   12:18 PM 03/28/2023    7:51 AM 12/02/2022    8:42 AM 06/02/2022    9:10 AM 07/15/2021   10:32 AM 03/05/2021   10:51 AM 11/03/2020    8:39 AM  PHQ 2/9 Scores  PHQ - 2 Score 0 0 0 0 0 0 0  PHQ- 9 Score 0 0 0   0     Fall Risk    05/24/2023   12:18 PM 03/28/2023    7:51 AM 12/02/2022    8:42 AM 07/15/2021   10:32 AM 03/05/2021   10:51 AM  Fall Risk   Falls in the past year? 0 0 0 0 0  Number falls in past yr: 1 0 0 0 0  Injury with Fall? 0 0 0 0 0  Risk for fall due to : No Fall Risks No Fall Risks No Fall Risks No Fall Risks   Follow up Falls prevention discussed Falls evaluation completed Falls evaluation completed Falls evaluation completed Falls evaluation completed    MEDICARE RISK AT HOME: Medicare Risk at Home Any stairs in or around  the home?: No If so, are there any without handrails?: No Home free of loose throw rugs in walkways, pet beds, electrical cords, etc?: Yes Adequate lighting in your home to reduce risk of falls?: Yes Life alert?: No Use of a cane, walker or w/c?: No Grab bars in the bathroom?: No Shower chair or bench in shower?: No Elevated toilet seat or a handicapped toilet?: No  TIMED UP AND GO:  Was the test performed?   No    Cognitive Function:    05/24/2023   12:19 PM  MMSE - Mini Mental State Exam  Not completed: Unable to complete        05/24/2023   12:19 PM  6CIT Screen  What Year? 0 points  What month? 0 points  What time? 0 points  Count back from 20 0 points  Months in reverse 0 points  Repeat phrase 0 points  Total Score 0 points    Immunizations Immunization History  Administered Date(s) Administered   Tdap 04/01/2020    TDAP status: Up to date  Flu Vaccine status: Due, Education has been provided regarding the importance of this vaccine. Advised may receive this vaccine at local pharmacy or Health Dept. Aware to provide a copy of the vaccination record if obtained from local pharmacy or Health Dept. Verbalized acceptance and understanding.  Pneumococcal vaccine status: Due, Education has been provided regarding the importance of this vaccine. Advised may receive this vaccine at local pharmacy or Health Dept. Aware to provide a copy of the vaccination record if obtained from local pharmacy or Health Dept. Verbalized acceptance and understanding.  Covid-19 vaccine status: Completed vaccines  Qualifies for Shingles Vaccine? No   Zostavax completed No   Shingrix Completed?: No.    Education has been provided regarding the importance of this vaccine. Patient has been advised to call insurance company to determine out of pocket expense if they have not yet received this vaccine. Advised may also receive vaccine at local pharmacy or Health Dept. Verbalized acceptance and understanding.  Screening Tests Health Maintenance  Topic Date Due   FOOT EXAM  02/26/2023   OPHTHALMOLOGY EXAM  04/27/2023   INFLUENZA VACCINE  10/03/2023 (Originally 02/03/2023)   HEMOGLOBIN A1C  09/25/2023   Diabetic kidney evaluation - Urine ACR  12/02/2023   Diabetic kidney evaluation - eGFR measurement  03/27/2024   Medicare Annual Wellness (AWV)  05/23/2024   Colonoscopy  08/26/2027   DTaP/Tdap/Td (2 -  Td or Tdap) 04/01/2030   HIV Screening  Completed   HPV VACCINES  Aged Out   COVID-19 Vaccine  Discontinued   Hepatitis C Screening  Discontinued    Health Maintenance  Health Maintenance Due  Topic Date Due   FOOT EXAM  02/26/2023   OPHTHALMOLOGY EXAM  04/27/2023    Colorectal cancer screening: Type of screening: Colonoscopy. Completed 08/25/2022. Repeat every 5[pl years  Lung Cancer Screening: (Low Dose CT Chest recommended if Age 81-80 years, 20 pack-year currently smoking OR have quit w/in 15years.) does not qualify.   Lung Cancer Screening Referral: no  Additional Screening:  Hepatitis C Screening: does qualify; Completed 08/01/2020  Vision Screening: Recommended annual ophthalmology exams for early detection of glaucoma and other disorders of the eye. Is the patient up to date with their annual eye exam?  Yes  Who is the provider or what is the name of the office in which the patient attends annual eye exams? [facility] If pt is not established with a provider, would they  like to be referred to a provider to establish care? No .   Dental Screening: Recommended annual dental exams for proper oral hygiene  Diabetic Foot Exam: Diabetic Foot Exam: Overdue, Pt has been advised about the importance in completing this exam. Pt is scheduled for diabetic foot exam on 08/01/2023.  Community Resource Referral / Chronic Care Management: CRR required this visit?  No   CCM required this visit?  No     Plan:     I have personally reviewed and noted the following in the patient's chart:   Medical and social history Use of alcohol, tobacco or illicit drugs  Current medications and supplements including opioid prescriptions. Patient is not currently taking opioid prescriptions. Functional ability and status Nutritional status Physical activity Advanced directives List of other physicians Hospitalizations, surgeries, and ER visits in previous 12 months Vitals Screenings  to include cognitive, depression, and falls Referrals and appointments  In addition, I have reviewed and discussed with patient certain preventive protocols, quality metrics, and best practice recommendations. A written personalized care plan for preventive services as well as general preventive health recommendations were provided to patient.     Mickeal Needy, LPN   02/72/5366   After Visit Summary: (Declined) Due to this being a telephonic visit, with patients personalized plan was offered to patient but patient Declined AVS at this time   Nurse Notes: None

## 2023-06-21 ENCOUNTER — Other Ambulatory Visit: Payer: Self-pay | Admitting: Family Medicine

## 2023-06-21 ENCOUNTER — Other Ambulatory Visit: Payer: Self-pay | Admitting: Oncology

## 2023-06-21 DIAGNOSIS — G620 Drug-induced polyneuropathy: Secondary | ICD-10-CM

## 2023-06-22 ENCOUNTER — Other Ambulatory Visit: Payer: Self-pay | Admitting: Family Medicine

## 2023-06-22 ENCOUNTER — Other Ambulatory Visit: Payer: Self-pay | Admitting: Oncology

## 2023-06-22 DIAGNOSIS — G620 Drug-induced polyneuropathy: Secondary | ICD-10-CM

## 2023-06-22 MED ORDER — PREGABALIN 150 MG PO CAPS: 150.0000 mg | ORAL_CAPSULE | Freq: Two times a day (BID) | ORAL | 3 refills | Status: DC

## 2023-07-31 NOTE — Progress Notes (Unsigned)
Subjective:  Patient ID: Michael Woods, male    DOB: [birth year]  Age: 48 y.o. MRN: [id number]  Chief Complaint  Patient presents with   Medical Management of Chronic Issues    HPI   Diabetes:  Complications: HTN Patient does not check sugars daily. Most recent A1C: 5.5% Current medications: Mounjaro 12.5 mg weekly.  Last Eye Exam: 04/26/22 (retinal screening.) Foot checks: Yes  Neuropathy in BL feet taking Lyrica 150 mg twice daily.   Hyperlipidemia: Current medications: Zetia 10 mg daily, lipitor 10 mg daily. Fenofibrate 160 mg once daily, and lovaza 1 gm 2 capsules twice daily.    Hypertension: Off all bp medicines due to low bp.   Neuropathy associated with chemo. Takes lyrica 150 mg one twice daily.   Gout flare up sometimes. Responds to colchicine and Allopurinol. Increase in allopurinol to 300 mg once daily has helped.  Diet: Eating healthy (trying).   Right shoulder pain since last fall, has taken ibuprofen 600 mg for pain which helps some. Has low back pain in am.   Follicular lymphoma: managed by [facility].      05/24/2023   12:18 PM 03/28/2023    7:51 AM 12/02/2022    8:42 AM 06/02/2022    9:10 AM 07/15/2021   10:32 AM  Depression screen PHQ 2/9  Decreased Interest 0 0 0 0 0  Down, Depressed, Hopeless 0 0 0 0 0  PHQ - 2 Score 0 0 0 0 0  Altered sleeping 0 0 0    Tired, decreased energy 0 0 0    Change in appetite 0 0 0    Feeling bad or failure about yourself  0 0 0    Trouble concentrating 0 0 0    Moving slowly or fidgety/restless 0 0 0    Suicidal thoughts 0 0 0    PHQ-9 Score 0 0 0    Difficult doing work/chores Not difficult at all Not difficult at all Not difficult at all          05/24/2023   12:18 PM  Fall Risk   Falls in the past year? 0  Number falls in past yr: 1  Injury with Fall? 0  Risk for fall due to : No Fall Risks  Follow up Falls prevention discussed    Patient Care Team: Stephens Shreve, Fritzi Mandes, MD as PCP - General  (Family Medicine) Weston Settle, MD as Consulting Physician (Oncology)   Review of Systems  Current Outpatient Medications on File Prior to Visit  Medication Sig Dispense Refill   acetaminophen (TYLENOL) 325 MG tablet Take 2 tablets (650 mg total) by mouth every 6 (six) hours as needed for mild pain (or Fever >/= 101). 12 tablet 0   allopurinol (ZYLOPRIM) 300 MG tablet Take 1 tablet (300 mg total) by mouth daily. 90 tablet 1   atorvastatin (LIPITOR) 10 MG tablet TAKE ONE TABLET BY MOUTH ONCE DAILY 90 tablet 0   colchicine 0.6 MG tablet TAKE ONE TABLET BY MOUTH TWICE DAILY FOR 7 DAYS FOR GOUT flare. Hold atorvastatin while taking course of colchicine. 14 tablet 2   cyclobenzaprine (FLEXERIL) 5 MG tablet TAKE ONE TABLET BY MOUTH THREE TIMES DAILY AS NEEDED FOR MUSCLE SPASMS 90 tablet 2   ezetimibe (ZETIA) 10 MG tablet Take 1 tablet (10 mg total) by mouth daily. 90 tablet 1   fenofibrate 160 MG tablet TAKE ONE TABLET BY MOUTH ONCE DAILY 90 tablet 0   fluticasone (FLONASE) 50 MCG/ACT  nasal spray Place 2 sprays into both nostrils daily.      ibuprofen (ADVIL) 800 MG tablet Take 1 tablet (800 mg total) by mouth every 8 (eight) hours as needed. 40 tablet 1   loratadine (CLARITIN) 10 MG tablet Take 10 mg by mouth daily.     omega-3 acid ethyl esters (LOVAZA) 1 g capsule TAKE TWO CAPSULES BY MOUTH TWICE DAILY 360 capsule 0   pregabalin (LYRICA) 150 MG capsule Take 1 capsule (150 mg total) by mouth 2 (two) times daily. 180 capsule 3   tirzepatide (MOUNJARO) 12.5 MG/0.5ML Pen Inject 12.5 mg into the skin once a week. 8 mL 0   VENTOLIN HFA 108 (90 Base) MCG/ACT inhaler      Current Facility-Administered Medications on File Prior to Visit  Medication Dose Route Frequency Provider Last Rate Last Admin   heparin lock flush 100 unit/mL  500 Units Intravenous Once Lewis, Dequincy A, MD       heparin lock flush 100 unit/mL  500 Units Intravenous Once Lewis, Dequincy A, MD       triamcinolone acetonide  (KENALOG-40) injection 40 mg  40 mg Intra-articular Once        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HCC)    Diabetes (HCC) 07/2020   new diagnosis   Follicular lymphoma (HCC) 2011    recurrent/notes 01/11/2018   GERD (gastroesophageal reflux disease)    "food related" (01/11/2018)   Gout 07/14/2021   History of gout    History of stomach ulcers 1990s   HTN (hypertension)    Neuropathy    Pneumonia    recurrent/notes 01/11/2018   PONV (postoperative nausea and vomiting)    Social anxiety disorder    Past Surgical History:  Procedure Laterality Date   AXILLARY LYMPH NODE BIOPSY Left 2011   CARPAL TUNNEL RELEASE Right    KNEE ARTHROSCOPY Bilateral    LUMBAR PUNCTURE  12/2017    post LP HA and found to have csf leak requiring blood patch/notes 01/02/2018   PORTA CATH INSERTION Left 2011   TONSILLECTOMY      Family History  Problem Relation Age of Onset   Hypertension Mother    Leukemia Mother    Diabetes Mellitus II Mother    Leukemia Father    Diabetes type II Father    Colon cancer Father    Colon polyps Neg Hx    Stomach cancer Neg Hx    Esophageal cancer Neg Hx    Rectal cancer Neg Hx    Social History   Socioeconomic History   Marital status: Married    Spouse name: Not on file   Number of children: 1   Years of education: Not on file   Highest education level: Not on file  Occupational History   Not on file  Tobacco Use   Smoking status: Former    Current packs/day: 0.00    Average packs/day: 3.0 packs/day for 12.0 years (36.0 ttl pk-yrs)    Types: Cigarettes    Start date: 2    Quit date: 2007    Years since quitting: 18.0   Smokeless tobacco: Never  Vaping Use   Vaping status: Former  Substance and Sexual Activity   Alcohol use: Not Currently    Alcohol/week: 2.0 standard drinks of alcohol    Types: 2 Cans of beer per week   Drug use: Not  Currently   Sexual activity: Yes  Other Topics Concern   Not on file  Social History Narrative   Not on file   Social Drivers of Health   Financial Resource Strain: Low Risk  (05/24/2023)   Overall Financial Resource Strain (CARDIA)    Difficulty of Paying Living Expenses: Not hard at all  Food Insecurity: No Food Insecurity (05/24/2023)   Hunger Vital Sign    Worried About Running Out of Food in the Last Year: Never true    Ran Out of Food in the Last Year: Never true  Transportation Needs: No Transportation Needs (05/24/2023)   PRAPARE - Administrator, Civil Service (Medical): No    Lack of Transportation (Non-Medical): No  Physical Activity: Inactive (05/24/2023)   Exercise Vital Sign    Days of Exercise per Week: 0 days    Minutes of Exercise per Session: 0 min  Stress: No Stress Concern Present (05/24/2023)   [employer] of Occupational Health - Occupational Stress Questionnaire    Feeling of Stress : Not at all  Social Connections: Moderately Isolated (05/24/2023)   Social Connection and Isolation Panel [NHANES]    Frequency of Communication with Friends and Family: More than three times a week    Frequency of Social Gatherings with Friends and Family: More than three times a week    Attends Religious Services: Never    Database administrator or Organizations: No    Attends Banker Meetings: Never    Marital Status: Married    Objective:  There were no vitals taken for this visit.     05/24/2023   12:14 PM 05/24/2023   12:08 PM 03/28/2023    7:48 AM  BP/Weight  Systolic BP   130  Diastolic BP   82  Wt. (Lbs) 250 250 257  BMI 31.25 kg/m2 31.25 kg/m2 32.12 kg/m2    Physical Exam  Diabetic Foot Exam - Simple   No data filed      Lab Results  Component Value Date   WBC 6.7 03/28/2023   HGB 15.8 03/28/2023   HCT 47.3 03/28/2023   PLT 270 03/28/2023   GLUCOSE 87 03/28/2023   CHOL 124 03/28/2023   TRIG 75 03/28/2023    HDL 33 (L) 03/28/2023   LDLCALC 76 03/28/2023   ALT 49 (H) 03/28/2023   AST 35 03/28/2023   NA 143 03/28/2023   K 4.6 03/28/2023   CL 105 03/28/2023   CREATININE 1.32 (H) 03/28/2023   BUN 8 03/28/2023   CO2 23 03/28/2023   HGBA1C 5.5 03/28/2023   MICROALBUR 30 07/15/2021      Assessment & Plan:    There are no diagnoses linked to this encounter.   No orders of the defined types were placed in this encounter.   No orders of the defined types were placed in this encounter.    Follow-up: No follow-ups on file.   I,Marla I Leal-Borjas,acting as a scribe for Blane Ohara, MD.,have documented all relevant documentation on the behalf of Blane Ohara, MD,as directed by  Blane Ohara, MD while in the presence of Blane Ohara, MD.   An After Visit Summary was printed and given to the patient.  Blane Ohara, MD [facility] [phone]

## 2023-08-01 ENCOUNTER — Encounter: Payer: Self-pay | Admitting: Family Medicine

## 2023-08-01 ENCOUNTER — Ambulatory Visit (INDEPENDENT_AMBULATORY_CARE_PROVIDER_SITE_OTHER): Payer: Medicare HMO | Admitting: Family Medicine

## 2023-08-01 VITALS — BP 128/68 | HR 101 | Temp 97.8°F | Ht 75.0 in | Wt 257.0 lb

## 2023-08-01 DIAGNOSIS — E66811 Obesity, class 1: Secondary | ICD-10-CM

## 2023-08-01 DIAGNOSIS — K7581 Nonalcoholic steatohepatitis (NASH): Secondary | ICD-10-CM | POA: Diagnosis not present

## 2023-08-01 DIAGNOSIS — E782 Mixed hyperlipidemia: Secondary | ICD-10-CM

## 2023-08-01 DIAGNOSIS — I10 Essential (primary) hypertension: Secondary | ICD-10-CM | POA: Diagnosis not present

## 2023-08-01 DIAGNOSIS — C8208 Follicular lymphoma grade I, lymph nodes of multiple sites: Secondary | ICD-10-CM | POA: Diagnosis not present

## 2023-08-01 DIAGNOSIS — Z6832 Body mass index (BMI) 32.0-32.9, adult: Secondary | ICD-10-CM | POA: Diagnosis not present

## 2023-08-01 DIAGNOSIS — E1159 Type 2 diabetes mellitus with other circulatory complications: Secondary | ICD-10-CM

## 2023-08-01 DIAGNOSIS — E6609 Other obesity due to excess calories: Secondary | ICD-10-CM | POA: Diagnosis not present

## 2023-08-01 DIAGNOSIS — E118 Type 2 diabetes mellitus with unspecified complications: Secondary | ICD-10-CM

## 2023-08-01 DIAGNOSIS — I152 Hypertension secondary to endocrine disorders: Secondary | ICD-10-CM | POA: Diagnosis not present

## 2023-08-01 MED ORDER — IBUPROFEN 800 MG PO TABS: 800.0000 mg | ORAL_TABLET | Freq: Three times a day (TID) | ORAL | 0 refills | Status: DC | PRN

## 2023-08-02 LAB — CBC WITH DIFFERENTIAL/PLATELET
Basophils Absolute: 0.1 10*3/uL (ref 0.0–0.2)
Basos: 1 %
EOS (ABSOLUTE): 0 10*3/uL (ref 0.0–0.4)
Eos: 1 %
Hematocrit: 48.3 % (ref 37.5–51.0)
Hemoglobin: 16.7 g/dL (ref 13.0–17.7)
Immature Grans (Abs): 0 10*3/uL (ref 0.0–0.1)
Immature Granulocytes: 0 %
Lymphocytes Absolute: 0.9 10*3/uL (ref 0.7–3.1)
Lymphs: 19 %
MCH: 31 pg (ref 26.6–33.0)
MCHC: 34.6 g/dL (ref 31.5–35.7)
MCV: 90 fL (ref 79–97)
Monocytes Absolute: 0.5 10*3/uL (ref 0.1–0.9)
Monocytes: 11 %
Neutrophils Absolute: 3.3 10*3/uL (ref 1.4–7.0)
Neutrophils: 68 %
Platelets: 196 10*3/uL (ref 150–450)
RBC: 5.39 x10E6/uL (ref 4.14–5.80)
RDW: 13.1 % (ref 11.6–15.4)
WBC: 4.9 10*3/uL (ref 3.4–10.8)

## 2023-08-02 LAB — LIPID PANEL
Chol/HDL Ratio: 3.6 {ratio} (ref 0.0–5.0)
Cholesterol, Total: 130 mg/dL (ref 100–199)
HDL: 36 mg/dL — ABNORMAL LOW (ref >39)
LDL Chol Calc (NIH): 75 mg/dL (ref 0–99)
Triglycerides: 103 mg/dL (ref 0–149)
VLDL Cholesterol Cal: 19 mg/dL (ref 5–40)

## 2023-08-02 LAB — HEMOGLOBIN A1C
Est. average glucose Bld gHb Est-mCnc: 108 mg/dL
Hgb A1c MFr Bld: 5.4 % (ref 4.8–5.6)

## 2023-08-02 LAB — COMPREHENSIVE METABOLIC PANEL
ALT: 57 [IU]/L — ABNORMAL HIGH (ref 0–44)
AST: 41 [IU]/L — ABNORMAL HIGH (ref 0–40)
Albumin: 4.6 g/dL (ref 4.1–5.1)
Alkaline Phosphatase: 78 [IU]/L (ref 44–121)
BUN/Creatinine Ratio: 8 — ABNORMAL LOW (ref 9–20)
BUN: 11 mg/dL (ref 6–24)
Bilirubin Total: 1 mg/dL (ref 0.0–1.2)
CO2: 20 mmol/L (ref 20–29)
Calcium: 10 mg/dL (ref 8.7–10.2)
Chloride: 103 mmol/L (ref 96–106)
Creatinine, Ser: 1.35 mg/dL — ABNORMAL HIGH (ref 0.76–1.27)
Globulin, Total: 1.7 g/dL (ref 1.5–4.5)
Glucose: 84 mg/dL (ref 70–99)
Potassium: 3.9 mmol/L (ref 3.5–5.2)
Sodium: 141 mmol/L (ref 134–144)
Total Protein: 6.3 g/dL (ref 6.0–8.5)
eGFR: 66 mL/min/{1.73_m2} (ref >59)

## 2023-08-02 LAB — MICROALBUMIN / CREATININE URINE RATIO
Creatinine, Urine: 180.2 mg/dL
Microalb/Creat Ratio: 31 mg/g{creat} — ABNORMAL HIGH (ref 0–29)
Microalbumin, Urine: 55 ug/mL

## 2023-08-02 NOTE — Assessment & Plan Note (Signed)
Complications: hyperlipidemia.  Control: good Recommend check sugars fasting daily. Recommend check feet daily. Recommend annual eye exams. Medicines: mounjaro 12.5 mg qweek  Continue to work on eating a healthy diet and exercise.  Labs drawn today.

## 2023-08-02 NOTE — Assessment & Plan Note (Signed)
Management per specialist.

## 2023-08-02 NOTE — Assessment & Plan Note (Signed)
Well controlled.  No changes to medicines. Zetia 10 mg daily, lipitor 10 mg daily. Fenofibrate 160 mg once daily, and lovaza 1 gm 2 capsules twice daily.  Continue to work on eating a healthy diet and exercise.  Labs drawn today.

## 2023-08-02 NOTE — Assessment & Plan Note (Signed)
Recommend continue to work on eating healthy diet and exercise. Comorbidities: hyperlipidemia and diabetes.

## 2023-08-02 NOTE — Assessment & Plan Note (Signed)
Check cmp

## 2023-08-02 NOTE — Assessment & Plan Note (Signed)
Well controlled.  No medicines.  Continue to work on eating a healthy diet and exercise.  Labs drawn today.

## 2023-08-03 ENCOUNTER — Other Ambulatory Visit: Payer: Self-pay

## 2023-08-03 MED ORDER — LOSARTAN POTASSIUM 25 MG PO TABS: 25.0000 mg | ORAL_TABLET | Freq: Every day | ORAL | 1 refills | Status: DC

## 2023-08-03 NOTE — Telephone Encounter (Signed)
I sent the Rx to the pharmacy.

## 2023-08-22 ENCOUNTER — Other Ambulatory Visit: Payer: Self-pay | Admitting: Family Medicine

## 2023-08-22 DIAGNOSIS — E782 Mixed hyperlipidemia: Secondary | ICD-10-CM

## 2023-08-22 DIAGNOSIS — M7541 Impingement syndrome of right shoulder: Secondary | ICD-10-CM

## 2023-08-24 ENCOUNTER — Other Ambulatory Visit: Payer: Self-pay | Admitting: Family Medicine

## 2023-09-20 ENCOUNTER — Other Ambulatory Visit: Payer: Self-pay | Admitting: Family Medicine

## 2023-09-27 ENCOUNTER — Encounter: Payer: Self-pay | Admitting: Hematology and Oncology

## 2023-10-28 ENCOUNTER — Other Ambulatory Visit: Payer: Self-pay

## 2023-10-28 DIAGNOSIS — C8298 Follicular lymphoma, unspecified, lymph nodes of multiple sites: Secondary | ICD-10-CM

## 2023-11-10 ENCOUNTER — Inpatient Hospital Stay: Attending: Oncology

## 2023-11-10 ENCOUNTER — Inpatient Hospital Stay: Payer: Medicare HMO | Admitting: Oncology

## 2023-11-10 ENCOUNTER — Ambulatory Visit (HOSPITAL_BASED_OUTPATIENT_CLINIC_OR_DEPARTMENT_OTHER)
Admission: RE | Admit: 2023-11-10 | Discharge: 2023-11-10 | Disposition: A | Discharge status: Home / Self Care | Source: Ambulatory Visit | Attending: Oncology | Admitting: Oncology

## 2023-11-10 DIAGNOSIS — C8298 Follicular lymphoma, unspecified, lymph nodes of multiple sites: Secondary | ICD-10-CM

## 2023-11-10 DIAGNOSIS — C8280 Other types of follicular lymphoma, unspecified site: Secondary | ICD-10-CM

## 2023-11-10 DIAGNOSIS — Z8572 Personal history of non-Hodgkin lymphomas: Secondary | ICD-10-CM | POA: Diagnosis not present

## 2023-11-10 DIAGNOSIS — C8590 Non-Hodgkin lymphoma, unspecified, unspecified site: Secondary | ICD-10-CM | POA: Diagnosis not present

## 2023-11-10 DIAGNOSIS — K5730 Diverticulosis of large intestine without perforation or abscess without bleeding: Secondary | ICD-10-CM | POA: Diagnosis not present

## 2023-11-10 DIAGNOSIS — R918 Other nonspecific abnormal finding of lung field: Secondary | ICD-10-CM | POA: Diagnosis not present

## 2023-11-10 LAB — CBC WITH DIFFERENTIAL (CANCER CENTER ONLY)
Abs Immature Granulocytes: 0.01 10*3/uL (ref 0.00–0.07)
Basophils Absolute: 0.1 10*3/uL (ref 0.0–0.1)
Basophils Relative: 1 %
Eosinophils Absolute: 0.1 10*3/uL (ref 0.0–0.5)
Eosinophils Relative: 2 %
HCT: 45.2 % (ref 39.0–52.0)
Hemoglobin: 15.2 g/dL (ref 13.0–17.0)
Immature Granulocytes: 0 %
Lymphocytes Relative: 45 %
Lymphs Abs: 2.1 10*3/uL (ref 0.7–4.0)
MCH: 30.9 pg (ref 26.0–34.0)
MCHC: 33.6 g/dL (ref 30.0–36.0)
MCV: 91.9 fL (ref 80.0–100.0)
Monocytes Absolute: 0.5 10*3/uL (ref 0.1–1.0)
Monocytes Relative: 11 %
Neutro Abs: 1.9 10*3/uL (ref 1.7–7.7)
Neutrophils Relative %: 41 %
Platelet Count: 215 10*3/uL (ref 150–400)
RBC: 4.92 MIL/uL (ref 4.22–5.81)
RDW: 14.4 % (ref 11.5–15.5)
WBC Count: 4.6 10*3/uL (ref 4.0–10.5)
nRBC: 0 % (ref 0.0–0.2)

## 2023-11-10 LAB — CMP (CANCER CENTER ONLY)
ALT: 45 U/L — ABNORMAL HIGH (ref 0–44)
AST: 33 U/L (ref 15–41)
Albumin: 4.3 g/dL (ref 3.5–5.0)
Alkaline Phosphatase: 78 U/L (ref 38–126)
Anion gap: 11 (ref 5–15)
BUN: 13 mg/dL (ref 6–20)
CO2: 24 mmol/L (ref 22–32)
Calcium: 10.9 mg/dL — ABNORMAL HIGH (ref 8.9–10.3)
Chloride: 103 mmol/L (ref 98–111)
Creatinine: 1.48 mg/dL — ABNORMAL HIGH (ref 0.61–1.24)
GFR, Estimated: 59 mL/min — ABNORMAL LOW (ref >60)
Glucose, Bld: 86 mg/dL (ref 70–99)
Potassium: 4.4 mmol/L (ref 3.5–5.1)
Sodium: 138 mmol/L (ref 135–145)
Total Bilirubin: 0.5 mg/dL (ref 0.0–1.2)
Total Protein: 6.3 g/dL — ABNORMAL LOW (ref 6.5–8.1)

## 2023-11-10 LAB — LACTATE DEHYDROGENASE: LDH: 173 U/L (ref 98–192)

## 2023-11-10 LAB — I-STAT CREATININE (MANUAL ENTRY): Creatinine, Ser: 1.5 — AB (ref 0.50–1.10)

## 2023-11-10 MED ORDER — IOHEXOL 300 MG/ML  SOLN
100.0000 mL | Freq: Once | INTRAMUSCULAR | Status: AC | PRN
  Administered 2023-11-10: 100 mL via INTRAVENOUS

## 2023-11-10 MED ORDER — SODIUM CHLORIDE 0.9% FLUSH
10.0000 mL | INTRAVENOUS | Status: DC | PRN
  Administered 2023-11-10: 10 mL

## 2023-11-10 MED ORDER — HEPARIN SOD (PORK) LOCK FLUSH 100 UNIT/ML IV SOLN
500.0000 [IU] | Freq: Once | INTRAVENOUS | Status: AC | PRN
  Administered 2023-11-10: 500 [IU]

## 2023-11-10 NOTE — Progress Notes (Signed)
 [facility]  [address] [place],  [place]  [address] [phone]  Clinic Day:  11/11/2023  Referring physician: Mercy Stall, MD  HISTORY OF PRESENT ILLNESS:  The patient is a 48 y.o. male with recurrent follicular lymphoma.  From September 2015 to June 2019, his follicular lymphoma was kept under ideal control with Revlimid /Rituxan .  The patient had a cryptococcal infection which led to his Revlimid /Rituxan  being held in June 2019.  Fortunately, all physical exams, labs, and scans done since then have not shown any evidence of recurrent lymphoma to where systemic therapy has needed to be reinitiated.  The patient comes in today for routine follow up, as well as to review his most recent CT scans.  Since his last visit, he has been doing well.  He denies having any B symptoms or bulky lymphadenopathy which concerns him for his follicular lymphoma returning despite being off any form of systemic therapy for approximately 6 years.  PHYSICAL EXAM:  Blood pressure (!) 145/105, pulse 75, temperature 98 F (36.7 C), temperature source Oral, resp. rate 16, height 6\' 3"  (1.905 m), weight 257 lb 6.4 oz (116.8 kg), SpO2 100%. Wt Readings from Last 3 Encounters:  11/11/23 257 lb 6.4 oz (116.8 kg)  08/01/23 257 lb (116.6 kg)  05/24/23 250 lb (113.4 kg)   Body mass index is 32.17 kg/m. Performance status (ECOG): 0 Physical Exam Constitutional:      Appearance: Normal appearance. He is not ill-appearing.  HENT:     Mouth/Throat:     Mouth: Mucous membranes are moist.     Pharynx: Oropharynx is clear. No oropharyngeal exudate or posterior oropharyngeal erythema.  Cardiovascular:     Rate and Rhythm: Normal rate and regular rhythm.     Heart sounds: No murmur heard.    No friction rub. No gallop.  Pulmonary:     Effort: Pulmonary effort is normal. No respiratory distress.     Breath sounds: Normal breath sounds. No wheezing, rhonchi or rales.  Abdominal:      General: Bowel sounds are normal. There is no distension.     Palpations: Abdomen is soft. There is no mass.     Tenderness: There is no abdominal tenderness.  Musculoskeletal:        General: No swelling.     Right lower leg: No edema.     Left lower leg: No edema.  Lymphadenopathy:     Cervical: No cervical adenopathy.     Upper Body:     Right upper body: No supraclavicular or axillary adenopathy.     Left upper body: No supraclavicular or axillary adenopathy.     Lower Body: No right inguinal adenopathy. No left inguinal adenopathy.  Skin:    General: Skin is warm.     Coloration: Skin is not jaundiced.     Findings: No lesion or rash.  Neurological:     General: No focal deficit present.     Mental Status: He is alert and oriented to person, place, and time. Mental status is at baseline.  Psychiatric:        Mood and Affect: Mood normal.        Behavior: Behavior normal.        Thought Content: Thought content normal.   SCANS: CT scans of his chest/abdomen/pelvis on 11-10-23 revealed the following: FINDINGS: CT CHEST FINDINGS   Cardiovascular: Left chest Port-A-Cath with tip near the superior cavoatrial junction. Normal caliber thoracic aorta. Normal size heart. No  significant pericardial effusion/thickening.   Mediastinum/Nodes: No suspicious thyroid  nodule. No pathologically enlarged mediastinal, hilar or axillary lymph nodes. The esophagus is grossly unremarkable.   Lungs/Pleura: Spiculated right upper lobe pulmonary nodule measures 19 x 8 mm on image 24/302 previously 2.1 x 0.9 cm.   Subpleural nodule in the pendant left lower lobe measures 8 mm on image 58/302 previously 9 mm. Additional dependent nodule in the left lower lobe measures 8 mm on image 70/302, unchanged.   No new suspicious pulmonary nodules or masses.   Scattered atelectasis/scarring.   Musculoskeletal: No aggressive lytic or blastic lesion of bone.   CT ABDOMEN PELVIS FINDINGS    Hepatobiliary: No suspicious hepatic lesion. Gallbladder is unremarkable. No biliary ductal dilation.   Pancreas: No pancreatic ductal dilation or evidence of acute inflammation.   Spleen: No splenomegaly.   Adrenals/Urinary Tract: Bilateral adrenal glands appear normal. No hydronephrosis. Kidneys demonstrate symmetric enhancement. Urinary bladder is unremarkable for degree of distension.   Stomach/Bowel: Stomach is unremarkable for degree of distension. No pathologic dilation of small or large bowel. Normal appendix. No evidence of acute bowel inflammation. Colonic diverticulosis.   Vascular/Lymphatic: Normal caliber abdominal aorta. Smooth IVC contours. The portal, splenic and superior mesenteric veins are patent. No pathologically enlarged abdominal or pelvic lymph nodes.   Reproductive: Prostate is unremarkable.   Other: No significant abdominopelvic free fluid.   Musculoskeletal: No aggressive lytic or blastic lesion of bone.   IMPRESSION: 1. No pathologically enlarged lymph nodes in the chest, abdomen or pelvis and no splenomegaly. 2. Stable bilateral pulmonary nodules. LABS:    Latest Reference Range & Units 11/10/23 11:19  WBC 4.0 - 10.5 K/uL 4.6  RBC 4.22 - 5.81 MIL/uL 4.92  Hemoglobin 13.0 - 17.0 g/dL 16.1  HCT 09.6 - 04.5 % 45.2  MCV 80.0 - 100.0 fL 91.9  MCH 26.0 - 34.0 pg 30.9  MCHC 30.0 - 36.0 g/dL 40.9  RDW 81.1 - 91.4 % 14.4  Platelets 150 - 400 K/uL 215  nRBC 0.0 - 0.2 % 0.00  Neutrophils % 41  Lymphocytes % 45  Monocytes Relative % 11  Eosinophil % 2  Basophil % 1  Immature Granulocytes % 0    Latest Reference Range & Units 11/10/23 11:19  LDH 98 - 192 U/L 173    Latest Reference Range & Units 11/10/23 11:19  Sodium 135 - 145 mmol/L 138  Potassium 3.5 - 5.1 mmol/L 4.4  Chloride 98 - 111 mmol/L 103  CO2 22 - 32 mmol/L 24  Glucose 70 - 99 mg/dL 86  BUN 6 - 20 mg/dL 13  Creatinine 7.82 - 9.56 mg/dL 2.13 (H)  Calcium  8.9 - 10.3 mg/dL 08.6  (H)  Anion gap 5 - 15  11  Alkaline Phosphatase 38 - 126 U/L 78  Albumin 3.5 - 5.0 g/dL 4.3  AST 15 - 41 U/L 33  ALT 0 - 44 U/L 45 (H)  Total Protein 6.5 - 8.1 g/dL 6.3 (L)  Total Bilirubin 0.0 - 1.2 mg/dL 0.5  GFR, Est Non African American >60 mL/min 59 (L)  (H): Data is abnormally high (L): Data is abnormally low  ASSESSMENT & PLAN:  Assessment/Plan:  A 48 y.o. male with a history of recurrent follicular lymphoma.  In clinic today, I went over all of his CT scan images with him, for which he could see he remains disease-free.  I am also comforted by the fact that all of his peripheral counts and his LDH levels remain normal.  Clinically,  he continues to do very well.  As that is the case, I feel comfortable seeing him back in 1 year.  Labs and CT scans will be done a day before his next visit to ensure there remains no laboratory or radiographic evidence of disease recurrence.  The patient understands all the plans discussed today and is in agreement with them.    Jakeisha Stricker Felicia Horde, MD

## 2023-11-11 ENCOUNTER — Other Ambulatory Visit: Payer: Self-pay | Admitting: Oncology

## 2023-11-11 ENCOUNTER — Inpatient Hospital Stay (HOSPITAL_BASED_OUTPATIENT_CLINIC_OR_DEPARTMENT_OTHER): Admitting: Oncology

## 2023-11-11 VITALS — BP 145/105 | HR 75 | Temp 98.0°F | Resp 16 | Ht 75.0 in | Wt 257.4 lb

## 2023-11-11 DIAGNOSIS — C8298 Follicular lymphoma, unspecified, lymph nodes of multiple sites: Secondary | ICD-10-CM

## 2023-11-11 DIAGNOSIS — Z8572 Personal history of non-Hodgkin lymphomas: Secondary | ICD-10-CM | POA: Diagnosis not present

## 2023-11-11 DIAGNOSIS — C8218 Follicular lymphoma grade II, lymph nodes of multiple sites: Secondary | ICD-10-CM | POA: Diagnosis not present

## 2023-11-15 ENCOUNTER — Other Ambulatory Visit: Payer: Self-pay | Admitting: Family Medicine

## 2023-11-15 DIAGNOSIS — E118 Type 2 diabetes mellitus with unspecified complications: Secondary | ICD-10-CM

## 2023-11-22 ENCOUNTER — Other Ambulatory Visit: Payer: Self-pay | Admitting: Family Medicine

## 2023-11-22 DIAGNOSIS — M109 Gout, unspecified: Secondary | ICD-10-CM

## 2023-11-29 ENCOUNTER — Other Ambulatory Visit: Payer: Self-pay | Admitting: Family Medicine

## 2023-11-29 DIAGNOSIS — E782 Mixed hyperlipidemia: Secondary | ICD-10-CM

## 2023-12-19 ENCOUNTER — Other Ambulatory Visit: Payer: Self-pay | Admitting: Family Medicine

## 2024-01-30 ENCOUNTER — Encounter: Payer: Self-pay | Admitting: Family Medicine

## 2024-01-30 ENCOUNTER — Ambulatory Visit (INDEPENDENT_AMBULATORY_CARE_PROVIDER_SITE_OTHER): Payer: Medicare HMO | Admitting: Family Medicine

## 2024-01-30 VITALS — BP 118/70 | HR 83 | Temp 98.2°F | Ht 75.0 in | Wt 254.0 lb

## 2024-01-30 DIAGNOSIS — E782 Mixed hyperlipidemia: Secondary | ICD-10-CM

## 2024-01-30 DIAGNOSIS — E118 Type 2 diabetes mellitus with unspecified complications: Secondary | ICD-10-CM | POA: Diagnosis not present

## 2024-01-30 DIAGNOSIS — G629 Polyneuropathy, unspecified: Secondary | ICD-10-CM

## 2024-01-30 DIAGNOSIS — C8208 Follicular lymphoma grade I, lymph nodes of multiple sites: Secondary | ICD-10-CM | POA: Diagnosis not present

## 2024-01-30 DIAGNOSIS — M1A4720 Other secondary chronic gout, left ankle and foot, without tophus (tophi): Secondary | ICD-10-CM | POA: Diagnosis not present

## 2024-01-30 DIAGNOSIS — I10 Essential (primary) hypertension: Secondary | ICD-10-CM

## 2024-01-30 MED ORDER — TIRZEPATIDE 15 MG/0.5ML ~~LOC~~ SOAJ: 15.0000 mg | SUBCUTANEOUS | 1 refills | Status: DC

## 2024-01-30 NOTE — Progress Notes (Unsigned)
Subjective:  Patient ID: Michael Woods, male    DOB: January 23, 1976  Age: 48 y.o. MRN: [id number]  Chief Complaint  Patient presents with   Medical Management of Chronic Issues   Discussed the use of AI scribe software for clinical note transcription with the patient, who gave verbal consent to proceed.  History of Present Illness   Michael Woods is a 48 year old male who presents for a routine follow-up visit.  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, providing some physical activity  Dyslipidemia - Treated with Zetia , atorvastatin , fenofibrate , and Lovaza   Peripheral neuropathy - Neuropathy secondary to chemotherapy - Managed with Lyrica  - Symptoms are stable and well-adapted  Gout prophylaxis - Taking allopurinol  300 mg once daily for prevention - Colchicine  available as needed - No recent gout flare-ups  History of lymphoma - Lymphoma is currently stable with no new developments  Allergic rhinitis and asthma - Uses Claritin and Flonase  for allergies - Possesses an old albuterol inhaler, not currently in use  Dermatitis - Recent episode of poison ivy, currently resolving without significant discomfort  Constitutional and systemic symptoms - No fevers, chills, sweats, earaches, cough, respiratory distress, chest pain, abdominal pain, or bowel/bladder dysfunction          01/30/2024    9:25 AM 08/01/2023    9:24 AM 05/24/2023   12:18 PM 03/28/2023    7:51 AM 12/02/2022    8:42 AM  Depression screen PHQ 2/9  Decreased Interest 0 0 0 0 0  Down, Depressed, Hopeless 0 0 0 0 0  PHQ - 2 Score 0 0 0 0 0  Altered sleeping 0 0 0 0 0  Tired, decreased energy 0 0 0 0 0  Change in appetite 0 0 0 0 0  Feeling bad or failure about yourself  0 0 0 0 0  Trouble concentrating 0 0 0 0 0   Moving slowly or fidgety/restless 0 0 0 0 0  Suicidal thoughts 0 0 0 0 0  PHQ-9 Score 0 0 0 0 0  Difficult doing work/chores Not difficult at all Not difficult at all Not difficult at all Not difficult at all Not difficult at all        01/30/2024    9:25 AM  Fall Risk   Falls in the past year? 0  Number falls in past yr: 0  Injury with Fall? 0    Patient Care Team: Sherre Clapper, MD as PCP - General (Family Medicine) Ezzard Valaria LABOR, MD as Consulting Physician (Oncology)   Review of Systems  Constitutional:  Negative for chills, diaphoresis, fatigue and fever.  HENT:  Negative for congestion, ear pain and sore throat.   Respiratory:  Negative for cough and shortness of breath.   Cardiovascular:  Negative for chest pain and leg swelling.  Gastrointestinal:  Negative for abdominal pain, constipation, diarrhea, nausea and vomiting.  Genitourinary:  Negative for dysuria and urgency.  Musculoskeletal:  Negative for arthralgias and myalgias.  Skin:  Positive for rash (poison ivy mild. improving.).  Neurological:  Negative for dizziness and headaches.  Psychiatric/Behavioral:  Negative for dysphoric mood.     Current Outpatient Medications on File Prior to Visit  Medication Sig Dispense Refill   acetaminophen  (TYLENOL ) 325 MG tablet Take 2 tablets (650 mg total) by mouth every 6 (six)  hours as needed for mild pain (or Fever >/= 101). 12 tablet 0   allopurinol  (ZYLOPRIM ) 300 MG tablet TAKE ONE TABLET BY MOUTH ONCE DAILY TO prevent gout 90 tablet 1   atorvastatin  (LIPITOR) 10 MG tablet TAKE ONE TABLET BY MOUTH ONCE DAILY 90 tablet 0   colchicine  0.6 MG tablet TAKE ONE TABLET BY MOUTH TWICE DAILY FOR 7 DAYS FOR GOUT flare. Hold atorvastatin  while taking course of colchicine . 14 tablet 2   cyclobenzaprine  (FLEXERIL ) 5 MG tablet TAKE ONE TABLET BY MOUTH THREE TIMES DAILY AS NEEDED FOR MUSCLE SPASMS 90 tablet 0   ezetimibe  (ZETIA ) 10 MG tablet Take 1 tablet (10 mg total) by mouth daily.  90 tablet 0   fenofibrate  160 MG tablet TAKE ONE TABLET BY MOUTH ONCE DAILY 90 tablet 1   fluticasone  (FLONASE ) 50 MCG/ACT nasal spray Place 2 sprays into both nostrils daily.      ibuprofen  (ADVIL ) 800 MG tablet Take 1 tablet (800 mg total) by mouth every 8 (eight) hours as needed. 270 tablet 0   loratadine (CLARITIN) 10 MG tablet Take 10 mg by mouth daily.     losartan  (COZAAR ) 25 MG tablet Take 1 tablet (25 mg total) by mouth daily. 90 tablet 1   omega-3 acid ethyl esters (LOVAZA ) 1 g capsule TAKE TWO CAPSULES BY MOUTH TWICE DAILY 360 capsule 0   pregabalin  (LYRICA ) 150 MG capsule Take 1 capsule (150 mg total) by mouth 2 (two) times daily. 180 capsule 3   Current Facility-Administered Medications on File Prior to Visit  Medication Dose Route Frequency Provider Last Rate Last Admin   heparin  lock flush 100 unit/mL  500 Units Intravenous Once Lewis, Dequincy A, MD       heparin  lock flush 100 unit/mL  500 Units Intravenous Once Lewis, Dequincy A, MD       triamcinolone  acetonide (KENALOG -40) injection 40 mg  40 mg Intra-articular Once        Past Medical History:  Diagnosis Date   Chronic lower back pain    pulled muscle; have bone spurs (01/11/2018)   Cryptococcal pneumonitis (HCC)    Diabetes (HCC) 07/2020   new diagnosis   Follicular lymphoma (HCC) 2011    recurrent/notes 01/11/2018   GERD (gastroesophageal reflux disease)    food related (01/11/2018)   Gout 07/14/2021   History of gout    History of stomach ulcers 1990s   HTN (hypertension)    Neuropathy    Pneumonia    recurrent/notes 01/11/2018   PONV (postoperative nausea and vomiting)    Social anxiety disorder    Past Surgical History:  Procedure Laterality Date   AXILLARY LYMPH NODE BIOPSY Left 2011   CARPAL TUNNEL RELEASE Right    KNEE ARTHROSCOPY Bilateral    LUMBAR PUNCTURE  12/2017    post LP HA and found to have csf leak requiring blood patch/notes 01/02/2018   PORTA CATH INSERTION Left 2011   TONSILLECTOMY       Family History  Problem Relation Age of Onset   Hypertension Mother    Leukemia Mother    Diabetes Mellitus II Mother    Leukemia Father    Diabetes type II Father    Colon cancer Father    Colon polyps Neg Hx    Stomach cancer Neg Hx    Esophageal cancer Neg Hx    Rectal cancer Neg Hx    Social History   Socioeconomic History   Marital status: Married    Spouse name:  Not on file   Number of children: 1   Years of education: Not on file   Highest education level: Not on file  Occupational History   Not on file  Tobacco Use   Smoking status: Former    Current packs/day: 0.00    Average packs/day: 3.0 packs/day for 12.0 years (36.0 ttl pk-yrs)    Types: Cigarettes    Start date: 61    Quit date: 2007    Years since quitting: 18.5   Smokeless tobacco: Never  Vaping Use   Vaping status: Former  Substance and Sexual Activity   Alcohol use: Not Currently    Alcohol/week: 2.0 standard drinks of alcohol    Types: 2 Cans of beer per week   Drug use: Not Currently   Sexual activity: Yes  Other Topics Concern   Not on file  Social History Narrative   Not on file   Social Drivers of Health   Financial Resource Strain: Low Risk  (05/24/2023)   Overall Financial Resource Strain (CARDIA)    Difficulty of Paying Living Expenses: Not hard at all  Food Insecurity: No Food Insecurity (01/30/2024)   Hunger Vital Sign    Worried About Running Out of Food in the Last Year: Never true    Ran Out of Food in the Last Year: Never true  Transportation Needs: No Transportation Needs (01/30/2024)   PRAPARE - Administrator, Civil Service (Medical): No    Lack of Transportation (Non-Medical): No  Physical Activity: Inactive (05/24/2023)   Exercise Vital Sign    Days of Exercise per Week: 0 days    Minutes of Exercise per Session: 0 min  Stress: No Stress Concern Present (05/24/2023)   [employer] of Occupational Health - Occupational Stress Questionnaire     Feeling of Stress : Not at all  Social Connections: Moderately Isolated (05/24/2023)   Social Connection and Isolation Panel    Frequency of Communication with Friends and Family: More than three times a week    Frequency of Social Gatherings with Friends and Family: More than three times a week    Attends Religious Services: Never    Database administrator or Organizations: No    Attends Engineer, structural: Never    Marital Status: Married    Objective:  BP 118/70   Pulse 83   Temp 98.2 F (36.8 C)   Ht 6' 3 (1.905 m)   Wt 254 lb (115.2 kg)   SpO2 96%   BMI 31.75 kg/m      01/30/2024    9:21 AM 11/11/2023    1:01 PM 08/01/2023    9:23 AM  BP/Weight  Systolic BP 118 145 128  Diastolic BP 70 105 68  Wt. (Lbs) 254 257.4 257  BMI 31.75 kg/m2 32.17 kg/m2 32.12 kg/m2    Physical Exam Vitals reviewed.  Constitutional:      Appearance: Normal appearance.  Neck:     Vascular: No carotid bruit.  Cardiovascular:     Rate and Rhythm: Normal rate and regular rhythm.     Pulses: Normal pulses.     Heart sounds: Normal heart sounds.  Pulmonary:     Effort: Pulmonary effort is normal.     Breath sounds: Normal breath sounds. No wheezing, rhonchi or rales.  Abdominal:     General: Bowel sounds are normal.     Palpations: Abdomen is soft.     Tenderness: There is no abdominal tenderness.  Neurological:  Mental Status: He is alert and oriented to person, place, and time.  Psychiatric:        Mood and Affect: Mood normal.        Behavior: Behavior normal.      Diabetic foot exam was performed with the following findings:   No deformities, ulcerations, or other skin breakdown Intact posterior tibialis and dorsalis pedis pulses Decreased sensation BL feet.       Lab Results  Component Value Date   WBC 7.8 01/30/2024   HGB 15.3 01/30/2024   HCT 47.2 01/30/2024   PLT 230 01/30/2024   GLUCOSE 77 01/30/2024   CHOL 128 01/30/2024   TRIG 119 01/30/2024    HDL 32 (L) 01/30/2024   LDLCALC 74 01/30/2024   ALT 37 01/30/2024   AST 34 01/30/2024   NA 140 01/30/2024   K 4.6 01/30/2024   CL 105 01/30/2024   CREATININE 1.52 (H) 01/30/2024   BUN 17 01/30/2024   CO2 22 01/30/2024   HGBA1C 5.3 01/30/2024   MICROALBUR 30 07/15/2021      Assessment & Plan:  Neuropathy Assessment & Plan: Lyrica  may be providing some benefit, but symptoms may not be fully managed. - Continue Lyrica  for neuropathy management.   Mixed hyperlipidemia Assessment & Plan: Cholesterol levels good. Discussed reducing medications due to weight loss and good levels; he is open to it. - Evaluate possibility of reducing cholesterol medication  Orders: -     Lipid panel  Controlled type 2 diabetes mellitus with complication, without long-term current use of insulin  (HCC) Assessment & Plan: A1c well-controlled. Discussed dose increase for weight loss; he declined. Encouraged balanced diet. - Continue Mounjaro  12.5 mg. - Encourage balanced diet with fruits, vegetables, and proteins.  Orders: -     Hemoglobin A1c -     Tirzepatide ; Inject 15 mg into the skin once a week.  Dispense: 6 mL; Refill: 1  Essential hypertension, benign Assessment & Plan: Well controlled.  No medicines.  Continue to work on eating a healthy diet and exercise.  Labs drawn today.    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 - Continue allopurinol  300 mg once daily. - Use colchicine  as needed for flare-ups.   Grade 1 follicular lymphoma of lymph nodes of multiple regions [facility]) Assessment & Plan: Stable with no new developments.     Meds ordered this encounter  Medications   tirzepatide  (MOUNJARO ) 15 MG/0.5ML Pen    Sig: Inject 15 mg into the skin once a week.    Dispense:  6 mL    Refill:  1    Cancel all previous prescriptions  for mounjaro .    Orders Placed This Encounter  Procedures   Lipid panel   Hemoglobin A1c   Comprehensive metabolic panel   CBC with Differential/Platelet     Follow-up: Return in about 4 months (around 05/24/2024) for chronic fasting, awv.  I,Marla I Leal-Borjas,acting as a scribe for Abigail Free, MD.,have documented all relevant documentation on the behalf of Abigail Free, MD,as directed by  Abigail Free, MD while in the presence of Abigail Free, MD.    An After Visit Summary was printed and given to the patient.  I attest that I have reviewed this visit and agree with the plan scribed by my staff.   Abigail Free, MD [facility] (  [phone]    

## 2024-01-30 NOTE — Assessment & Plan Note (Signed)
 Lyrica  may be providing some benefit, but symptoms may not be fully managed. - Continue Lyrica  for neuropathy management.

## 2024-01-30 NOTE — Assessment & Plan Note (Signed)
 Cholesterol levels good. Discussed reducing medications due to weight loss and good levels; he is open to it. - Evaluate possibility of reducing cholesterol medication

## 2024-01-30 NOTE — Assessment & Plan Note (Signed)
 Stable with no new developments.

## 2024-01-30 NOTE — Assessment & Plan Note (Signed)
 A1c well-controlled. Discussed dose increase for weight loss; he declined. Encouraged balanced diet. - Continue Mounjaro  12.5 mg. - Encourage balanced diet with fruits, vegetables, and proteins. - complicated by hyperlipidemia.

## 2024-01-30 NOTE — Assessment & Plan Note (Signed)
 No recent flare-ups. - Continue allopurinol  300 mg once daily. - Use colchicine  as needed for flare-ups.

## 2024-01-30 NOTE — Assessment & Plan Note (Signed)
 Well controlled.  No medicines.  Continue to work on eating a healthy diet and exercise.  Labs drawn today.

## 2024-01-30 NOTE — Patient Instructions (Signed)
 VISIT SUMMARY:  Today, we reviewed your progress on weight management, diabetes, cholesterol, neuropathy, gout, and lymphoma. Your conditions are stable, and we discussed potential adjustments to your medications and lifestyle habits.  YOUR PLAN:  TYPE 2 DIABETES MELLITUS: Your A1c levels are well-controlled. -Increase Mounjaro  to 15 mg weekly. -Maintain a balanced diet with fruits, vegetables, and proteins.  HYPERLIPIDEMIA: Your cholesterol levels are good, and we discussed the possibility of reducing your medications. -Evaluate the possibility of reducing cholesterol medications.  PERIPHERAL NEUROPATHY: Your neuropathy symptoms are stable but may not be fully managed. -Continue taking Lyrica  for neuropathy management.  GOUT: You have not had any recent gout flare-ups. -Continue taking allopurinol  300 mg once daily. -Use colchicine  as needed for flare-ups.  LYMPHOMA: Your lymphoma is stable with no new developments. -No changes needed at this time.  GENERAL HEALTH MAINTENANCE: You are overdue for an eye exam and declined pneumonia and hepatitis B vaccines. -Schedule an eye exam PLEASE!  FOLLOW-UP: We will need to do lab work and consider potential medication adjustments. -Schedule a follow-up appointment in 3 months for AWV/Chronic visit

## 2024-01-31 ENCOUNTER — Ambulatory Visit: Payer: Self-pay | Admitting: Family Medicine

## 2024-01-31 LAB — CBC WITH DIFFERENTIAL/PLATELET
Basophils Absolute: 0.1 x10E3/uL (ref 0.0–0.2)
Basos: 1 %
EOS (ABSOLUTE): 0.2 x10E3/uL (ref 0.0–0.4)
Eos: 2 %
Hematocrit: 47.2 % (ref 37.5–51.0)
Hemoglobin: 15.3 g/dL (ref 13.0–17.7)
Immature Grans (Abs): 0 x10E3/uL (ref 0.0–0.1)
Immature Granulocytes: 0 %
Lymphocytes Absolute: 2.1 x10E3/uL (ref 0.7–3.1)
Lymphs: 27 %
MCH: 31.2 pg (ref 26.6–33.0)
MCHC: 32.4 g/dL (ref 31.5–35.7)
MCV: 96 fL (ref 79–97)
Monocytes Absolute: 0.6 x10E3/uL (ref 0.1–0.9)
Monocytes: 7 %
Neutrophils Absolute: 4.9 x10E3/uL (ref 1.4–7.0)
Neutrophils: 63 %
Platelets: 230 x10E3/uL (ref 150–450)
RBC: 4.91 x10E6/uL (ref 4.14–5.80)
RDW: 13.8 % (ref 11.6–15.4)
WBC: 7.8 x10E3/uL (ref 3.4–10.8)

## 2024-01-31 LAB — HEMOGLOBIN A1C
Est. average glucose Bld gHb Est-mCnc: 105 mg/dL
Hgb A1c MFr Bld: 5.3 % (ref 4.8–5.6)

## 2024-01-31 LAB — COMPREHENSIVE METABOLIC PANEL WITH GFR
ALT: 37 IU/L (ref 0–44)
AST: 34 IU/L (ref 0–40)
Albumin: 4.3 g/dL (ref 4.1–5.1)
Alkaline Phosphatase: 63 IU/L (ref 44–121)
BUN/Creatinine Ratio: 11 (ref 9–20)
BUN: 17 mg/dL (ref 6–24)
Bilirubin Total: 0.9 mg/dL (ref 0.0–1.2)
CO2: 22 mmol/L (ref 20–29)
Calcium: 10.1 mg/dL (ref 8.7–10.2)
Chloride: 105 mmol/L (ref 96–106)
Creatinine, Ser: 1.52 mg/dL — ABNORMAL HIGH (ref 0.76–1.27)
Globulin, Total: 1.8 g/dL (ref 1.5–4.5)
Glucose: 77 mg/dL (ref 70–99)
Potassium: 4.6 mmol/L (ref 3.5–5.2)
Sodium: 140 mmol/L (ref 134–144)
Total Protein: 6.1 g/dL (ref 6.0–8.5)
eGFR: 57 mL/min/1.73 — ABNORMAL LOW (ref >59)

## 2024-01-31 LAB — LIPID PANEL
Chol/HDL Ratio: 4 ratio (ref 0.0–5.0)
Cholesterol, Total: 128 mg/dL (ref 100–199)
HDL: 32 mg/dL — ABNORMAL LOW (ref >39)
LDL Chol Calc (NIH): 74 mg/dL (ref 0–99)
Triglycerides: 119 mg/dL (ref 0–149)
VLDL Cholesterol Cal: 22 mg/dL (ref 5–40)

## 2024-02-06 ENCOUNTER — Other Ambulatory Visit: Payer: Self-pay | Admitting: Family Medicine

## 2024-02-17 ENCOUNTER — Other Ambulatory Visit: Payer: Self-pay | Admitting: Family Medicine

## 2024-02-17 DIAGNOSIS — M7541 Impingement syndrome of right shoulder: Secondary | ICD-10-CM

## 2024-03-06 ENCOUNTER — Other Ambulatory Visit: Payer: Self-pay | Admitting: Family Medicine

## 2024-03-06 DIAGNOSIS — E782 Mixed hyperlipidemia: Secondary | ICD-10-CM

## 2024-03-19 ENCOUNTER — Other Ambulatory Visit: Payer: Self-pay | Admitting: Oncology

## 2024-03-19 ENCOUNTER — Other Ambulatory Visit: Payer: Self-pay | Admitting: Family Medicine

## 2024-03-19 DIAGNOSIS — G620 Drug-induced polyneuropathy: Secondary | ICD-10-CM

## 2024-03-19 DIAGNOSIS — M7541 Impingement syndrome of right shoulder: Secondary | ICD-10-CM

## 2024-03-21 ENCOUNTER — Other Ambulatory Visit: Payer: Self-pay

## 2024-03-21 DIAGNOSIS — G620 Drug-induced polyneuropathy: Secondary | ICD-10-CM

## 2024-03-21 MED ORDER — PREGABALIN 150 MG PO CAPS: 150.0000 mg | ORAL_CAPSULE | Freq: Two times a day (BID) | ORAL | 3 refills | Status: DC

## 2024-03-21 NOTE — Telephone Encounter (Signed)
 Pt returned call. He needs refill of Lyrica . He states the pharmacy has sent refill request here, but hasn't heard anything.

## 2024-05-07 DIAGNOSIS — S93691A Other sprain of right foot, initial encounter: Secondary | ICD-10-CM | POA: Diagnosis not present

## 2024-05-21 ENCOUNTER — Other Ambulatory Visit: Payer: Self-pay | Admitting: Family Medicine

## 2024-05-21 DIAGNOSIS — M109 Gout, unspecified: Secondary | ICD-10-CM

## 2024-05-21 DIAGNOSIS — S93691A Other sprain of right foot, initial encounter: Secondary | ICD-10-CM | POA: Diagnosis not present

## 2024-06-02 ENCOUNTER — Other Ambulatory Visit: Payer: Self-pay | Admitting: Family Medicine

## 2024-06-02 DIAGNOSIS — E782 Mixed hyperlipidemia: Secondary | ICD-10-CM

## 2024-06-02 DIAGNOSIS — M7541 Impingement syndrome of right shoulder: Secondary | ICD-10-CM

## 2024-06-05 ENCOUNTER — Encounter: Payer: Self-pay | Admitting: Family Medicine

## 2024-06-05 ENCOUNTER — Ambulatory Visit: Admitting: Family Medicine

## 2024-06-05 VITALS — BP 130/70 | HR 79 | Temp 98.0°F | Ht 75.0 in | Wt 258.0 lb

## 2024-06-05 DIAGNOSIS — Z0000 Encounter for general adult medical examination without abnormal findings: Secondary | ICD-10-CM

## 2024-06-05 DIAGNOSIS — E118 Type 2 diabetes mellitus with unspecified complications: Secondary | ICD-10-CM | POA: Diagnosis not present

## 2024-06-05 DIAGNOSIS — Z7985 Long-term (current) use of injectable non-insulin antidiabetic drugs: Secondary | ICD-10-CM | POA: Diagnosis not present

## 2024-06-05 DIAGNOSIS — G620 Drug-induced polyneuropathy: Secondary | ICD-10-CM

## 2024-06-05 DIAGNOSIS — I10 Essential (primary) hypertension: Secondary | ICD-10-CM

## 2024-06-05 DIAGNOSIS — K7581 Nonalcoholic steatohepatitis (NASH): Secondary | ICD-10-CM | POA: Diagnosis not present

## 2024-06-05 DIAGNOSIS — C8208 Follicular lymphoma grade I, lymph nodes of multiple sites: Secondary | ICD-10-CM | POA: Diagnosis not present

## 2024-06-05 LAB — POCT GLYCOSYLATED HEMOGLOBIN (HGB A1C): HbA1c POC (<> result, manual entry): 5.4 % (ref 4.0–5.6)

## 2024-06-05 LAB — POCT LIPID PANEL
HDL: 38
LDL: 79
Non-HDL: 104
TC: 141
TRG: 122

## 2024-06-05 LAB — COMPREHENSIVE METABOLIC PANEL WITH GFR
ALT: 53 IU/L — ABNORMAL HIGH (ref 0–44)
AST: 35 IU/L (ref 0–40)
Albumin: 4.7 g/dL (ref 4.1–5.1)
Alkaline Phosphatase: 61 IU/L (ref 47–123)
BUN/Creatinine Ratio: 12 (ref 9–20)
BUN: 18 mg/dL (ref 6–24)
Bilirubin Total: 0.7 mg/dL (ref 0.0–1.2)
CO2: 22 mmol/L (ref 20–29)
Calcium: 10.9 mg/dL — ABNORMAL HIGH (ref 8.7–10.2)
Chloride: 106 mmol/L (ref 96–106)
Creatinine, Ser: 1.48 mg/dL — ABNORMAL HIGH (ref 0.76–1.27)
Globulin, Total: 1.5 g/dL (ref 1.5–4.5)
Glucose: 92 mg/dL (ref 70–99)
Potassium: 5.1 mmol/L (ref 3.5–5.2)
Sodium: 143 mmol/L (ref 134–144)
Total Protein: 6.2 g/dL (ref 6.0–8.5)
eGFR: 58 mL/min/1.73 — ABNORMAL LOW (ref >59)

## 2024-06-05 LAB — CBC WITH DIFFERENTIAL/PLATELET
Basophils Absolute: 0 x10E3/uL (ref 0.0–0.2)
Basos: 1 %
EOS (ABSOLUTE): 0.1 x10E3/uL (ref 0.0–0.4)
Eos: 3 %
Hematocrit: 47.5 % (ref 37.5–51.0)
Hemoglobin: 16.1 g/dL (ref 13.0–17.7)
Immature Grans (Abs): 0 x10E3/uL (ref 0.0–0.1)
Immature Granulocytes: 0 %
Lymphocytes Absolute: 1.7 x10E3/uL (ref 0.7–3.1)
Lymphs: 35 %
MCH: 31.8 pg (ref 26.6–33.0)
MCHC: 33.9 g/dL (ref 31.5–35.7)
MCV: 94 fL (ref 79–97)
Monocytes Absolute: 0.4 x10E3/uL (ref 0.1–0.9)
Monocytes: 9 %
Neutrophils Absolute: 2.5 x10E3/uL (ref 1.4–7.0)
Neutrophils: 52 %
Platelets: 223 x10E3/uL (ref 150–450)
RBC: 5.07 x10E6/uL (ref 4.14–5.80)
RDW: 13.6 % (ref 11.6–15.4)
WBC: 4.8 x10E3/uL (ref 3.4–10.8)

## 2024-06-05 MED ORDER — PREGABALIN 150 MG PO CAPS: 150.0000 mg | ORAL_CAPSULE | Freq: Two times a day (BID) | ORAL | 3 refills | Status: DC

## 2024-06-05 MED ORDER — OMEGA-3-ACID ETHYL ESTERS 1 G PO CAPS: 2.0000 | ORAL_CAPSULE | Freq: Two times a day (BID) | ORAL | 1 refills | Status: AC

## 2024-06-05 MED ORDER — ATORVASTATIN CALCIUM 10 MG PO TABS: 10.0000 mg | ORAL_TABLET | Freq: Every day | ORAL | 0 refills | Status: AC

## 2024-06-05 MED ORDER — LOSARTAN POTASSIUM 25 MG PO TABS: 25.0000 mg | ORAL_TABLET | Freq: Every day | ORAL | 1 refills | Status: AC

## 2024-06-05 MED ORDER — CYCLOBENZAPRINE HCL 5 MG PO TABS: 5.0000 mg | ORAL_TABLET | Freq: Three times a day (TID) | ORAL | 1 refills | Status: AC | PRN

## 2024-06-05 MED ORDER — PREGABALIN 150 MG PO CAPS: 150.0000 mg | ORAL_CAPSULE | Freq: Two times a day (BID) | ORAL | 1 refills | Status: AC

## 2024-06-05 MED ORDER — TIRZEPATIDE 15 MG/0.5ML ~~LOC~~ SOAJ: 15.0000 mg | SUBCUTANEOUS | 1 refills | Status: DC

## 2024-06-05 NOTE — Assessment & Plan Note (Addendum)
 Excellent control Complications: hyperlipidemia.  Control: good Recommend check feet daily. Recommend annual eye exams. Medicines: mounjaro  15 mg qweek  Continue to work on eating a healthy diet and exercise.  Orders:   POCT Lipid Panel   POCT glycosylated hemoglobin (Hb A1C)   atorvastatin  (LIPITOR) 10 MG tablet; Take 1 tablet (10 mg total) by mouth daily.   cyclobenzaprine  (FLEXERIL ) 5 MG tablet; Take 1 tablet (5 mg total) by mouth 3 (three) times daily as needed for muscle spasms.   omega-3 acid ethyl esters (LOVAZA ) 1 g capsule; Take 2 capsules (2 g total) by mouth 2 (two) times daily.   tirzepatide  (MOUNJARO ) 15 MG/0.5ML Pen; Inject 15 mg into the skin once a week.

## 2024-06-05 NOTE — Patient Instructions (Signed)

## 2024-06-05 NOTE — Progress Notes (Signed)
 Chief Complaint  Patient presents with   Medical Management of Chronic Issues     Subjective:   Michael Woods is a 48 y.o. male who presents for a Medicare Annual Wellness Visit.  Visit info / Clinical Intake: Medicare Wellness Visit Type:: Subsequent Annual Wellness Visit Persons participating in visit and providing information:: patient Medicare Wellness Visit Mode:: In-person (required for WTM) Interpreter Needed?: No Pre-visit prep was completed: no AWV questionnaire completed by patient prior to visit?: no Living arrangements:: lives with spouse/significant other; with family/others Patient's Overall Health Status Rating: very good Typical amount of pain: some Does pain affect daily life?: (!) yes Are you currently prescribed opioids?: no  Dietary Habits and Nutritional Risks How many meals a day?: 3 Eats fruit and vegetables daily?: yes Most meals are obtained by: eating out In the last 2 weeks, have you had any of the following?: none Diabetic:: no  Functional Status Activities of Daily Living (to include ambulation/medication): Independent Ambulation: Independent Medication Administration: Independent Home Management (perform basic housework or laundry): Independent Manage your own finances?: yes Primary transportation is: driving Concerns about vision?: no *vision screening is required for WTM* Concerns about hearing?: no  Fall Screening Falls in the past year?: 0 Number of falls in past year: 0 Was there an injury with Fall?: 0 Fall Risk Category Calculator: 0 Patient Fall Risk Level: Low Fall Risk  Fall Risk Patient at Risk for Falls Due to: No Fall Risks Fall risk Follow up: Falls evaluation completed  Home and Transportation Safety: All rugs have non-skid backing?: yes All stairs or steps have railings?: yes Grab bars in the bathtub or shower?: (!) no Have non-skid surface in bathtub or shower?: yes Good home lighting?: yes Regular seat  belt use?: yes Hospital stays in the last year:: no  Cognitive Assessment Difficulty concentrating, remembering, or making decisions? : no Will 6CIT or Mini Cog be Completed: no  Advance Directives (For Healthcare) Does Patient Have a Medical Advance Directive?: No Would patient like information on creating a medical advance directive?: No - Patient declined  Reviewed/Updated  Reviewed/Updated: Reviewed All (Medical, Surgical, Family, Medications, Allergies, Care Teams, Patient Goals)    Allergies (verified) Clonidine   Current Medications (verified) Outpatient Encounter Medications as of 06/05/2024  Medication Sig   acetaminophen  (TYLENOL ) 325 MG tablet Take 2 tablets (650 mg total) by mouth every 6 (six) hours as needed for mild pain (or Fever >/= 101).   allopurinol  (ZYLOPRIM ) 300 MG tablet TAKE ONE TABLET BY MOUTH ONCE DAILY TO prevent gout   atorvastatin  (LIPITOR) 10 MG tablet Take 1 tablet (10 mg total) by mouth daily.   colchicine  0.6 MG tablet TAKE ONE TABLET BY MOUTH TWICE DAILY FOR 7 DAYS FOR GOUT flare. Hold atorvastatin  while taking course of colchicine .   cyclobenzaprine  (FLEXERIL ) 5 MG tablet Take 1 tablet (5 mg total) by mouth 3 (three) times daily as needed for muscle spasms.   ezetimibe  (ZETIA ) 10 MG tablet Take 1 tablet (10 mg total) by mouth daily.   fenofibrate  160 MG tablet TAKE ONE TABLET BY MOUTH ONCE DAILY   fluticasone  (FLONASE ) 50 MCG/ACT nasal spray Place 2 sprays into both nostrils daily.    ibuprofen  (ADVIL ) 800 MG tablet TAKE 1 TABLET EVERY 6 TO 8 HOURS AS NEEDED.   loratadine (CLARITIN) 10 MG tablet Take 10 mg by mouth daily.   losartan  (COZAAR ) 25 MG tablet Take 1 tablet (25 mg total) by mouth daily.   omega-3 acid ethyl esters (  LOVAZA ) 1 g capsule Take 2 capsules (2 g total) by mouth 2 (two) times daily.   pregabalin  (LYRICA ) 150 MG capsule Take 1 capsule (150 mg total) by mouth 2 (two) times daily.   [START ON 12/03/2024] pregabalin  (LYRICA ) 150 MG  capsule Take 1 capsule (150 mg total) by mouth 2 (two) times daily.   tirzepatide  (MOUNJARO ) 15 MG/0.5ML Pen Inject 15 mg into the skin once a week.   [DISCONTINUED] atorvastatin  (LIPITOR) 10 MG tablet TAKE ONE TABLET BY MOUTH ONCE DAILY   [DISCONTINUED] cyclobenzaprine  (FLEXERIL ) 5 MG tablet TAKE ONE TABLET BY MOUTH THREE TIMES DAILY AS NEEDED FOR MUSCLE SPASMS   [DISCONTINUED] losartan  (COZAAR ) 25 MG tablet TAKE ONE TABLET BY MOUTH ONCE DAILY   [DISCONTINUED] omega-3 acid ethyl esters (LOVAZA ) 1 g capsule TAKE TWO CAPSULES BY MOUTH TWICE DAILY   [DISCONTINUED] pregabalin  (LYRICA ) 150 MG capsule Take 1 capsule (150 mg total) by mouth 2 (two) times daily.   [DISCONTINUED] pregabalin  (LYRICA ) 150 MG capsule Take 1 capsule (150 mg total) by mouth 2 (two) times daily.   [DISCONTINUED] tirzepatide  (MOUNJARO ) 15 MG/0.5ML Pen Inject 15 mg into the skin once a week.   Facility-Administered Encounter Medications as of 06/05/2024  Medication   heparin  lock flush 100 unit/mL   heparin  lock flush 100 unit/mL   triamcinolone  acetonide (KENALOG -40) injection 40 mg    History: Past Medical History:  Diagnosis Date   Chronic lower back pain    pulled muscle; have bone spurs (01/11/2018)   Cryptococcal pneumonitis (HCC)    Diabetes (HCC) 07/2020   new diagnosis   Follicular lymphoma (HCC) 2011    recurrent/notes 01/11/2018   GERD (gastroesophageal reflux disease)    food related (01/11/2018)   Gout 07/14/2021   History of gout    History of stomach ulcers 1990s   HTN (hypertension)    Neuropathy    Pneumonia    recurrent/notes 01/11/2018   PONV (postoperative nausea and vomiting)    Social anxiety disorder    Past Surgical History:  Procedure Laterality Date   AXILLARY LYMPH NODE BIOPSY Left 2011   CARPAL TUNNEL RELEASE Right    KNEE ARTHROSCOPY Bilateral    LUMBAR PUNCTURE  12/2017    post LP HA and found to have csf leak requiring blood patch/notes 01/02/2018   PORTA CATH INSERTION Left  2011   TONSILLECTOMY     Family History  Problem Relation Age of Onset   Hypertension Mother    Leukemia Mother    Diabetes Mellitus II Mother    Leukemia Father    Diabetes type II Father    Colon cancer Father    Colon polyps Neg Hx    Stomach cancer Neg Hx    Esophageal cancer Neg Hx    Rectal cancer Neg Hx    Social History   Occupational History   Not on file  Tobacco Use   Smoking status: Former    Current packs/day: 0.00    Average packs/day: 3.0 packs/day for 12.0 years (36.0 ttl pk-yrs)    Types: Cigarettes    Start date: 59    Quit date: 2007    Years since quitting: 18.9   Smokeless tobacco: Never  Vaping Use   Vaping status: Former  Substance and Sexual Activity   Alcohol use: Not Currently    Alcohol/week: 2.0 standard drinks of alcohol    Types: 2 Cans of beer per week   Drug use: Not Currently   Sexual activity: Yes  Tobacco Counseling Counseling given: Not Answered  SDOH Screenings   Food Insecurity: No Food Insecurity (06/05/2024)  Housing: Low Risk  (06/05/2024)  Transportation Needs: No Transportation Needs (06/05/2024)  Utilities: Not At Risk (06/05/2024)  Alcohol Screen: Low Risk  (06/05/2024)  Depression (PHQ2-9): Low Risk  (06/05/2024)  Financial Resource Strain: Low Risk  (06/05/2024)  Physical Activity: Insufficiently Active (06/05/2024)  Social Connections: Moderately Isolated (06/05/2024)  Stress: No Stress Concern Present (06/05/2024)  Tobacco Use: Medium Risk (06/05/2024)  Health Literacy: Adequate Health Literacy (06/05/2024)   See flowsheets for full screening details  Depression Screen PHQ 2 & 9 Depression Scale- Over the past 2 weeks, how often have you been bothered by any of the following problems? Little interest or pleasure in doing things: 0 Feeling down, depressed, or hopeless (PHQ Adolescent also includes...irritable): 0 PHQ-2 Total Score: 0 Trouble falling or staying asleep, or sleeping too much: 0 Feeling tired or having  little energy: 0 Poor appetite or overeating (PHQ Adolescent also includes...weight loss): 0 Feeling bad about yourself - or that you are a failure or have let yourself or your family down: 0 Trouble concentrating on things, such as reading the newspaper or watching television (PHQ Adolescent also includes...like school work): 0 Moving or speaking so slowly that other people could have noticed. Or the opposite - being so fidgety or restless that you have been moving around a lot more than usual: 0 Thoughts that you would be better off dead, or of hurting yourself in some way: 0 PHQ-9 Total Score: 0 If you checked off any problems, how difficult have these problems made it for you to do your work, take care of things at home, or get along with other people?: Not difficult at all     Goals Addressed   None          Objective:    Today's Vitals   06/05/24 0819  BP: 130/70  Pulse: 79  Temp: 98 F (36.7 C)  SpO2: 96%  Weight: 258 lb (117 kg)  Height: 6' 3 (1.905 m)   Body mass index is 32.25 kg/m.  Hearing/Vision screen Vision Screening   Right eye Left eye Both eyes  Without correction 20/40 20/30 20/20   With correction      Immunizations and Health Maintenance Health Maintenance  Topic Date Due   OPHTHALMOLOGY EXAM  04/27/2023   Influenza Vaccine  10/02/2024 (Originally 02/03/2024)   Pneumococcal Vaccine (1 of 2 - PCV) 06/05/2025 (Originally 04/28/1996)   Hepatitis B Vaccines 19-59 Average Risk (1 of 3 - 19+ 3-dose series) 06/05/2025 (Originally 04/28/1996)   Diabetic kidney evaluation - Urine ACR  07/31/2024   FOOT EXAM  07/31/2024   HEMOGLOBIN A1C  12/04/2024   Diabetic kidney evaluation - eGFR measurement  01/29/2025   Medicare Annual Wellness (AWV)  06/05/2025   Colonoscopy  08/26/2027   DTaP/Tdap/Td (2 - Td or Tdap) 04/01/2030   HIV Screening  Completed   HPV VACCINES  Aged Out   Meningococcal B Vaccine  Aged Out   COVID-19 Vaccine  Discontinued   Hepatitis C  Screening  Discontinued        Assessment/Plan:  This is a routine wellness examination for Deanthony.  Patient Care Team: Sherre Clapper, MD as PCP - General (Family Medicine) Ezzard Valaria LABOR, MD as Consulting Physician (Oncology)  I have personally reviewed and noted the following in the patient's chart:   Medical and social history Use of alcohol, tobacco or illicit drugs  Current medications  and supplements including opioid prescriptions. Functional ability and status Nutritional status Physical activity Advanced directives List of other physicians Hospitalizations, surgeries, and ER visits in previous 12 months Vitals Screenings to include cognitive, depression, and falls Referrals and appointments  Orders Placed This Encounter  Procedures   CBC with Differential/Platelet   Comprehensive metabolic panel with GFR   POCT Lipid Panel   POCT glycosylated hemoglobin (Hb A1C)   In addition, I have reviewed and discussed with patient certain preventive protocols, quality metrics, and best practice recommendations. A written personalized care plan for preventive services as well as general preventive health recommendations were provided to patient.   Katherina A Bramblett, CMA   06/05/2024   Return in 1 year (on 06/05/2025).  After Visit Summary: (In Person-Declined) Patient declined AVS at this time.  I attest that I have reviewed this visit and agree with the plan scribed by my staff.   Abigail Free, MD [facility] [phone]

## 2024-06-05 NOTE — Progress Notes (Signed)
 Subjective:  Patient ID: Michael Woods, male    DOB: 01-29-1976  Age: 48 y.o. MRN: [id number]  Chief Complaint  Patient presents with   Medical Management of Chronic Issues    HPI: Discussed the use of AI scribe software for clinical note transcription with the patient, who gave verbal consent to proceed.  History of Present Illness Michael Woods is a 48 year old male who presents for an annual wellness visit.  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 - No muscle pain, joint pain, or swelling - No recent falls or hospitalizations  Diabetes and Hyperlipidemia - Mounjaro  15 mg once weekly for glycemic control - Atorvastatin  10 mg daily for hyperlipidemia - Lovaza  fish oil, two capsules twice daily - Attempts to maintain a balanced diet, but frequently eats out due to travel commitments - Minimal alcohol consumption, approximately four to five drinks per week - No drug use  Gout prevention - Allopurinol  for gout prophylaxis - No recent gout flares  Hypertension - Losartan  25 mg daily for blood pressure control  Physical activity - Exercises by walking and being active on the field twice a week  Vision - No recent follow-up with eye doctor  Hearing - No concerns regarding hearing       06/05/2024    8:20 AM 01/30/2024    9:25 AM 08/01/2023    9:24 AM 05/24/2023   12:18 PM 03/28/2023    7:51 AM  Depression screen PHQ 2/9  Decreased Interest 0 0 0 0 0  Down, Depressed, Hopeless 0 0 0 0 0  PHQ - 2 Score 0 0 0 0 0  Altered sleeping  0 0 0 0  Tired, decreased energy  0 0 0 0  Change in appetite  0 0 0 0  Feeling bad or failure about yourself   0 0 0 0  Trouble concentrating  0 0 0 0  Moving slowly or  fidgety/restless  0 0 0 0  Suicidal thoughts  0 0 0 0  PHQ-9 Score  0  0  0  0   Difficult doing work/chores  Not difficult at all Not difficult at all Not difficult at all Not difficult at all     Data saved with a previous flowsheet row definition        06/05/2024    8:21 AM  Fall Risk   Falls in the past year? 0  Number falls in past yr: 0  Injury with Fall? 0  Risk for fall due to : No Fall Risks  Follow up Falls evaluation completed    Patient Care Team: Sherre Clapper, MD as PCP - General (Family Medicine) Ezzard Valaria LABOR, MD as Consulting Physician (Oncology)   Review of Systems  Constitutional:  Negative for chills, fatigue and fever.  HENT:  Negative for congestion, ear pain and sore throat.   Respiratory:  Negative for cough and shortness of breath.   Cardiovascular:  Negative for chest pain.  Gastrointestinal:  Negative for abdominal pain, constipation, diarrhea, nausea and vomiting.  Endocrine: Negative for polydipsia, polyphagia and polyuria.  Genitourinary:  Negative for dysuria and frequency.  Musculoskeletal:  Positive for back pain. Negative for arthralgias and myalgias.  Neurological:  Negative for dizziness and headaches.  Psychiatric/Behavioral:  Negative  for dysphoric mood.        No dysphoria    Current Outpatient Medications on File Prior to Visit  Medication Sig Dispense Refill   acetaminophen  (TYLENOL ) 325 MG tablet Take 2 tablets (650 mg total) by mouth every 6 (six) hours as needed for mild pain (or Fever >/= 101). 12 tablet 0   allopurinol  (ZYLOPRIM ) 300 MG tablet TAKE ONE TABLET BY MOUTH ONCE DAILY TO prevent gout 90 tablet 1   colchicine  0.6 MG tablet TAKE ONE TABLET BY MOUTH TWICE DAILY FOR 7 DAYS FOR GOUT flare. Hold atorvastatin  while taking course of colchicine . 14 tablet 2   ezetimibe  (ZETIA ) 10 MG tablet Take 1 tablet (10 mg total) by mouth daily. 90 tablet 1   fenofibrate  160 MG tablet TAKE ONE TABLET BY MOUTH ONCE DAILY 90 tablet 1    fluticasone  (FLONASE ) 50 MCG/ACT nasal spray Place 2 sprays into both nostrils daily.      ibuprofen  (ADVIL ) 800 MG tablet TAKE 1 TABLET EVERY 6 TO 8 HOURS AS NEEDED. 20 tablet 0   loratadine (CLARITIN) 10 MG tablet Take 10 mg by mouth daily.     Current Facility-Administered Medications on File Prior to Visit  Medication Dose Route Frequency Provider Last Rate Last Admin   heparin  lock flush 100 unit/mL  500 Units Intravenous Once Lewis, Dequincy A, MD       heparin  lock flush 100 unit/mL  500 Units Intravenous Once Lewis, Dequincy A, MD       triamcinolone  acetonide (KENALOG -40) injection 40 mg  40 mg Intra-articular Once        Past Medical History:  Diagnosis Date   Chronic lower back pain    pulled muscle; have bone spurs (01/11/2018)   Cryptococcal pneumonitis (HCC)    Diabetes (HCC) 07/2020   new diagnosis   Follicular lymphoma (HCC) 2011    recurrent/notes 01/11/2018   GERD (gastroesophageal reflux disease)    food related (01/11/2018)   Gout 07/14/2021   History of gout    History of stomach ulcers 1990s   HTN (hypertension)    Neuropathy    Pneumonia    recurrent/notes 01/11/2018   PONV (postoperative nausea and vomiting)    Social anxiety disorder    Past Surgical History:  Procedure Laterality Date   AXILLARY LYMPH NODE BIOPSY Left 2011   CARPAL TUNNEL RELEASE Right    KNEE ARTHROSCOPY Bilateral    LUMBAR PUNCTURE  12/2017    post LP HA and found to have csf leak requiring blood patch/notes 01/02/2018   PORTA CATH INSERTION Left 2011   TONSILLECTOMY      Family History  Problem Relation Age of Onset   Hypertension Mother    Leukemia Mother    Diabetes Mellitus II Mother    Leukemia Father    Diabetes type II Father    Colon cancer Father    Colon polyps Neg Hx    Stomach cancer Neg Hx    Esophageal cancer Neg Hx    Rectal cancer Neg Hx    Social History   Socioeconomic History   Marital status: Married    Spouse name: Not on file   Number of  children: 1   Years of education: Not on file   Highest education level: Not on file  Occupational History   Not on file  Tobacco Use   Smoking status: Former    Current packs/day: 0.00    Average packs/day: 3.0 packs/day for 12.0 years (36.0 ttl  pk-yrs)    Types: Cigarettes    Start date: 89    Quit date: 2007    Years since quitting: 18.9   Smokeless tobacco: Never  Vaping Use   Vaping status: Former  Substance and Sexual Activity   Alcohol use: Not Currently    Alcohol/week: 2.0 standard drinks of alcohol    Types: 2 Cans of beer per week   Drug use: Not Currently   Sexual activity: Yes  Other Topics Concern   Not on file  Social History Narrative   Not on file   Social Drivers of Health   Financial Resource Strain: Low Risk  (06/05/2024)   Overall Financial Resource Strain (CARDIA)    Difficulty of Paying Living Expenses: Not hard at all  Food Insecurity: No Food Insecurity (06/05/2024)   Hunger Vital Sign    Worried About Running Out of Food in the Last Year: Never true    Ran Out of Food in the Last Year: Never true  Transportation Needs: No Transportation Needs (06/05/2024)   PRAPARE - Administrator, Civil Service (Medical): No    Lack of Transportation (Non-Medical): No  Physical Activity: Insufficiently Active (06/05/2024)   Exercise Vital Sign    Days of Exercise per Week: 2 days    Minutes of Exercise per Session: 60 min  Stress: No Stress Concern Present (06/05/2024)   [employer] of Occupational Health - Occupational Stress Questionnaire    Feeling of Stress: Not at all  Social Connections: Moderately Isolated (06/05/2024)   Social Connection and Isolation Panel    Frequency of Communication with Friends and Family: More than three times a week    Frequency of Social Gatherings with Friends and Family: More than three times a week    Attends Religious Services: Never    Database Administrator or Organizations: No    Attends Museum/gallery Exhibitions Officer: Never    Marital Status: Married    Objective:  BP 130/70   Pulse 79   Temp 98 F (36.7 C)   Ht 6' 3 (1.905 m)   Wt 258 lb (117 kg)   SpO2 96%   BMI 32.25 kg/m      06/05/2024    8:19 AM 01/30/2024    9:21 AM 11/11/2023    1:01 PM  BP/Weight  Systolic BP 130 118 145  Diastolic BP 70 70 105  Wt. (Lbs) 258 254 257.4  BMI 32.25 kg/m2 31.75 kg/m2 32.17 kg/m2    Physical Exam Vitals reviewed.  Constitutional:      Appearance: Normal appearance.  Neck:     Vascular: No carotid bruit.  Cardiovascular:     Rate and Rhythm: Normal rate and regular rhythm.     Heart sounds: Normal heart sounds.  Pulmonary:     Effort: Pulmonary effort is normal.     Breath sounds: Normal breath sounds. No wheezing, rhonchi or rales.  Abdominal:     General: Bowel sounds are normal.     Palpations: Abdomen is soft.     Tenderness: There is no abdominal tenderness.  Neurological:     Mental Status: He is alert.  Psychiatric:        Mood and Affect: Mood normal.        Behavior: Behavior normal.         Lab Results  Component Value Date   WBC 7.8 01/30/2024   HGB 15.3 01/30/2024   HCT 47.2 01/30/2024   PLT 230  01/30/2024   GLUCOSE 77 01/30/2024   CHOL 128 01/30/2024   TRIG 119 01/30/2024   HDL 32 (L) 01/30/2024   LDLCALC 74 01/30/2024   ALT 37 01/30/2024   AST 34 01/30/2024   NA 140 01/30/2024   K 4.6 01/30/2024   CL 105 01/30/2024   CREATININE 1.52 (H) 01/30/2024   BUN 17 01/30/2024   CO2 22 01/30/2024   HGBA1C 5.4 06/05/2024    Results for orders placed or performed in visit on 06/05/24  POCT Lipid Panel   Collection Time: 06/05/24  8:39 AM  Result Value Ref Range   TC 141    HDL 38    TRG 122    LDL 79    Non-HDL 104    TC/HDL    POCT glycosylated hemoglobin (Hb A1C)   Collection Time: 06/05/24  8:39 AM  Result Value Ref Range   Hemoglobin A1C     HbA1c POC (<> result, manual entry) 5.4 4.0 - 5.6 %   HbA1c, POC (prediabetic range)      HbA1c, POC (controlled diabetic range)    .  Assessment & Plan:   Assessment & Plan Encounter for Medicare annual wellness exam Education given.  Refuses Vaccines.  Recommend go to eye doctor.  Visual acuity done in house. 20/20 BL     Essential hypertension, benign Well controlled.  Continue losartan  25 mg daily. Continue to work on eating a healthy diet and exercise.  Labs drawn today.   Orders:   CBC with Differential/Platelet   Comprehensive metabolic panel with GFR   losartan  (COZAAR ) 25 MG tablet; Take 1 tablet (25 mg total) by mouth daily.   Grade 1 follicular lymphoma of lymph nodes of multiple regions (HCC) Stable.  Management per specialist.  Sees annually.    NASH (nonalcoholic steatohepatitis) Recommend continue to work on eating healthy diet and exercise.     Controlled type 2 diabetes mellitus with complication, without long-term current use of insulin  (HCC) Excellent control Complications: hyperlipidemia.  Control: good Recommend check feet daily. Recommend annual eye exams. Medicines: mounjaro  15 mg qweek  Continue to work on eating a healthy diet and exercise.  Orders:   POCT Lipid Panel   POCT glycosylated hemoglobin (Hb A1C)   atorvastatin  (LIPITOR) 10 MG tablet; Take 1 tablet (10 mg total) by mouth daily.   cyclobenzaprine  (FLEXERIL ) 5 MG tablet; Take 1 tablet (5 mg total) by mouth 3 (three) times daily as needed for muscle spasms.   omega-3 acid ethyl esters (LOVAZA ) 1 g capsule; Take 2 capsules (2 g total) by mouth 2 (two) times daily.   tirzepatide  (MOUNJARO ) 15 MG/0.5ML Pen; Inject 15 mg into the skin once a week.   Drug-induced peripheral neuropathy I took over lyrica  prescription as he often has difficulty reaching his specialist. Orders:   pregabalin  (LYRICA ) 150 MG capsule; Take 1 capsule (150 mg total) by mouth 2 (two) times daily.   pregabalin  (LYRICA ) 150 MG capsule; Take 1 capsule (150 mg total) by mouth 2 (two) times  daily.    Body mass index is 32.25 kg/m.    Meds ordered this encounter  Medications   atorvastatin  (LIPITOR) 10 MG tablet    Sig: Take 1 tablet (10 mg total) by mouth daily.    Dispense:  90 tablet    Refill:  0   cyclobenzaprine  (FLEXERIL ) 5 MG tablet    Sig: Take 1 tablet (5 mg total) by mouth 3 (three) times daily as needed for muscle spasms.  Dispense:  270 tablet    Refill:  1   losartan  (COZAAR ) 25 MG tablet    Sig: Take 1 tablet (25 mg total) by mouth daily.    Dispense:  90 tablet    Refill:  1   omega-3 acid ethyl esters (LOVAZA ) 1 g capsule    Sig: Take 2 capsules (2 g total) by mouth 2 (two) times daily.    Dispense:  360 capsule    Refill:  1   DISCONTD: pregabalin  (LYRICA ) 150 MG capsule    Sig: Take 1 capsule (150 mg total) by mouth 2 (two) times daily.    Dispense:  180 capsule    Refill:  3    ICD10 code: G62.0   tirzepatide  (MOUNJARO ) 15 MG/0.5ML Pen    Sig: Inject 15 mg into the skin once a week.    Dispense:  6 mL    Refill:  1    Cancel all previous prescriptions for mounjaro .   pregabalin  (LYRICA ) 150 MG capsule    Sig: Take 1 capsule (150 mg total) by mouth 2 (two) times daily.    Dispense:  180 capsule    Refill:  1    ICD10 code: G62.0   pregabalin  (LYRICA ) 150 MG capsule    Sig: Take 1 capsule (150 mg total) by mouth 2 (two) times daily.    Dispense:  180 capsule    Refill:  1    ICD10 code: G62.0    Orders Placed This Encounter  Procedures   CBC with Differential/Platelet   Comprehensive metabolic panel with GFR   POCT Lipid Panel   POCT glycosylated hemoglobin (Hb A1C)       Follow-up: Return in 1 year (on 06/05/2025).  An After Visit Summary was printed and given to the patient.  Abigail Free, MD [facility] [phone]

## 2024-06-05 NOTE — Assessment & Plan Note (Addendum)
 Well controlled.  Continue losartan  25 mg daily. Continue to work on eating a healthy diet and exercise.  Labs drawn today.   Orders:   CBC with Differential/Platelet   Comprehensive metabolic panel with GFR   losartan  (COZAAR ) 25 MG tablet; Take 1 tablet (25 mg total) by mouth daily.

## 2024-06-05 NOTE — Assessment & Plan Note (Addendum)
 Recommend continue to work on eating healthy diet and exercise.

## 2024-06-05 NOTE — Assessment & Plan Note (Deleted)
  Orders:   POCT Lipid Panel   atorvastatin  (LIPITOR) 10 MG tablet; Take 1 tablet (10 mg total) by mouth daily.   omega-3 acid ethyl esters (LOVAZA ) 1 g capsule; Take 2 capsules (2 g total) by mouth 2 (two) times daily.

## 2024-06-05 NOTE — Assessment & Plan Note (Addendum)
 Stable.  Management per specialist.  Sees annually.

## 2024-06-06 ENCOUNTER — Ambulatory Visit: Payer: Self-pay | Admitting: Family Medicine

## 2024-07-02 ENCOUNTER — Other Ambulatory Visit: Payer: Self-pay | Admitting: Family Medicine

## 2024-07-02 DIAGNOSIS — E118 Type 2 diabetes mellitus with unspecified complications: Secondary | ICD-10-CM

## 2024-11-08 ENCOUNTER — Other Ambulatory Visit

## 2024-11-09 ENCOUNTER — Ambulatory Visit: Admitting: Oncology

## 2024-12-06 ENCOUNTER — Ambulatory Visit: Admitting: Family Medicine
